# Patient Record
Sex: Female | Born: 1945 | Race: White | Hispanic: No | Marital: Married | Smoking: Former smoker
Health system: Southern US, Community
[De-identification: ages and names within clinical notes are randomized; demographics above are authoritative.]

## PROBLEM LIST (undated history)

## (undated) DIAGNOSIS — N200 Calculus of kidney: Secondary | ICD-10-CM

## (undated) DIAGNOSIS — N390 Urinary tract infection, site not specified: Secondary | ICD-10-CM

## (undated) DIAGNOSIS — D126 Benign neoplasm of colon, unspecified: Secondary | ICD-10-CM

## (undated) DIAGNOSIS — I959 Hypotension, unspecified: Secondary | ICD-10-CM

## (undated) DIAGNOSIS — F419 Anxiety disorder, unspecified: Secondary | ICD-10-CM

## (undated) DIAGNOSIS — I499 Cardiac arrhythmia, unspecified: Secondary | ICD-10-CM

## (undated) DIAGNOSIS — K219 Gastro-esophageal reflux disease without esophagitis: Secondary | ICD-10-CM

## (undated) DIAGNOSIS — M1990 Unspecified osteoarthritis, unspecified site: Secondary | ICD-10-CM

## (undated) HISTORY — PX: POLYPECTOMY: SHX149

## (undated) HISTORY — DX: Gastro-esophageal reflux disease without esophagitis: K21.9

## (undated) HISTORY — PX: TONSILLECTOMY: SUR1361

## (undated) HISTORY — DX: Unspecified osteoarthritis, unspecified site: M19.90

## (undated) HISTORY — PX: BACK SURGERY: SHX140

## (undated) HISTORY — PX: OTHER SURGICAL HISTORY: SHX169

---

## 1982-04-29 HISTORY — PX: ABDOMINAL HYSTERECTOMY: SHX81

## 1988-04-29 HISTORY — PX: KNEE SURGERY: SHX244

## 1992-04-29 HISTORY — PX: HAND SURGERY: SHX662

## 1998-04-29 HISTORY — PX: EYE SURGERY: SHX253

## 2000-04-29 HISTORY — PX: BUNIONECTOMY: SHX129

## 2004-09-17 ENCOUNTER — Ambulatory Visit (HOSPITAL_COMMUNITY): Admission: RE | Admit: 2004-09-17 | Discharge: 2004-09-17 | Payer: Self-pay | Admitting: Gastroenterology

## 2005-04-29 HISTORY — PX: SHOULDER SURGERY: SHX246

## 2010-12-20 ENCOUNTER — Ambulatory Visit
Admission: RE | Admit: 2010-12-20 | Discharge: 2010-12-20 | Disposition: A | Discharge status: Home / Self Care | Payer: Medicare Other | Source: Ambulatory Visit | Attending: Family Medicine | Admitting: Family Medicine

## 2010-12-20 ENCOUNTER — Other Ambulatory Visit: Payer: Self-pay | Admitting: Family Medicine

## 2010-12-20 DIAGNOSIS — J209 Acute bronchitis, unspecified: Secondary | ICD-10-CM

## 2010-12-20 DIAGNOSIS — R05 Cough: Secondary | ICD-10-CM

## 2010-12-20 DIAGNOSIS — R059 Cough, unspecified: Secondary | ICD-10-CM

## 2011-07-23 ENCOUNTER — Encounter (INDEPENDENT_AMBULATORY_CARE_PROVIDER_SITE_OTHER): Payer: Self-pay | Admitting: General Surgery

## 2011-07-23 ENCOUNTER — Ambulatory Visit (INDEPENDENT_AMBULATORY_CARE_PROVIDER_SITE_OTHER): Payer: Medicare Other | Admitting: General Surgery

## 2011-07-23 VITALS — BP 148/85 | HR 80 | Temp 97.8°F | Ht 61.0 in | Wt 175.2 lb

## 2011-07-23 DIAGNOSIS — D126 Benign neoplasm of colon, unspecified: Secondary | ICD-10-CM | POA: Insufficient documentation

## 2011-07-23 NOTE — Patient Instructions (Addendum)
Take the bowel preparation the day before surgery. We stopped the medications we discussed one week before the surgery.  [facility]  ONE-DAY (1) PRE-OP HOME COLON PREP INSTRUCTIONS: ** MIRALAX / GATORADE PREP **  Fill the two prescriptions at a pharmacy of your choice.  You must follow the instructions below carefully.  If you have questions or problems, please call and speak to someone in the clinic department at our office:   [phone].  MIRALAX - GATORADE -- DULCOLAX TABS:   Fill the prescriptions for MIRALAX  (255 gm bottle)    In addition, purchase four (4) DULCOLAX TABLETS (no prescription required), and one 64 oz GATORADE.  (Do NOT purchase red Gatorade; any other flavor is acceptable).  ANITIBIOTICS:   There will be 2 different antibiotics.     Take both prescriptions THE AFTERNOON BEFORE your surgery, at the times written on the bottles.  INSTRUCTIONS: 1. Five days prior to your procedure do not eat nuts, popcorn, or fruit with seeds.  Stop all fiber supplements such as Metamucil, Citrucel, etc.  2. The day before your procedure: o 6:00am:  take (4) Dulcolax tablets.  You should remain on clear liquids for the entire day.   CLEAR LIQUIDS: clear bouillon, broth, jello (NOT RED), black coffee, tea, soda, etc o 10:00am:  add the bottle of MiraLax to the 64-oz bottle of Gatorade, and dissolve.  Begin drinking the Gatorade mixture until gone (8 oz every 15-30 minutes).  Continue clear liquids until midnight (or bedtime). o Take the antibiotics at the times instructed on the bottles.  3. The day of your procedure:   Do not eat or drink ANYTHING after midnight before your surgery.     If you take Heart or Blood Pressure medicine, ask the pre-op nurses about these during your preop appointment.   Further pre-operative instructions will be given to you from the hospital.   Expect to be contacted 5-7 days before your surgery.

## 2011-07-23 NOTE — Progress Notes (Addendum)
Patient ID: Katherine Floyd, female   DOB: 1946/02/26, 66 y.o.   MRN: [id number]  Chief Complaint  Patient presents with  . Pre-op Exam    eval Rt colon polyp lesion    Katherine Floyd is a 66 y.o. female.   HPIShe is referred by Dr. 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 In 2011, she had colonoscopy and removal of polyp in this area. Recently she had repeat colonoscopy and the polyp recurred and cannot be completely removed. 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 There is a family history of colon cancer in her maternal uncle. Her mother had breast cancer. She's been sent over here to discuss partial colectomy for her recurrent polyp.  Past Medical History  Diagnosis Date  . Arthritis   . Asthma   . GERD (gastroesophageal reflux disease)   . Osteoporosis     Past Surgical History  Procedure Date  . Eye surgery 2000  . Tonsillectomy age of 13  . Hand surgery 1994    cyst removed  . Bunionectomy 2002    right foot  . Knee surgery 1990  . Shoulder surgery 2007    right   . Abdominal hysterectomy 1984  . Back surgery 1997-1998    fell on ice and snow    Family History  Problem Relation Age of Onset  . Cancer Mother     breast  . Cancer Maternal Uncle     colon  . Cancer Cousin     breast    Social History History  Substance Use Topics  . Smoking status: Former Games developer  . Smokeless tobacco: Former Neurosurgeon    Quit date: 07/23/1998  . Alcohol Use: No    Allergies  Allergen Reactions  . Sulfur Itching  . Ampicillin Itching and Rash    Current Outpatient Prescriptions  Medication Sig Dispense Refill  . ADVAIR DISKUS 100-50 MCG/DOSE AEPB       . alendronate (FOSAMAX) 70 MG tablet       . ALPRAZolam (XANAX) 0.5 MG tablet       . calcium carbonate (OS-CAL) 600 MG TABS Take 600 mg by mouth daily.      .  cetirizine (ZYRTEC) 10 MG tablet Take 10 mg by mouth daily.      Marland Kitchen docusate sodium (COLACE) 100 MG capsule Take 100 mg by mouth Nightly.      . Grape Seed 100 MG CAPS Take 100 mg by mouth daily.      . hydrochlorothiazide (HYDRODIURIL) 25 MG tablet Take 25 mg by mouth daily.      . montelukast (SINGULAIR) 10 MG tablet       . omeprazole (PRILOSEC) 40 MG capsule Take 40 mg by mouth daily.      Marland Kitchen PROAIR HFA 108 (90 BASE) MCG/ACT inhaler       . Red Yeast Rice 600 MG CAPS Take 2 capsules by mouth daily.      . vitamin E 400 UNIT capsule Take 400 Units by mouth daily.      Rolene Arbour BOWEL PREP SOLN         Review of Systems Review of Systems  Constitutional: Negative.   HENT: Positive for congestion.   Respiratory: Positive for wheezing.   Cardiovascular: Negative.   Gastrointestinal: Positive for constipation.  Genitourinary: Negative.   Musculoskeletal: Positive for  arthralgias.  Neurological: Positive for headaches.  Hematological: Negative.     Blood pressure 148/85, pulse 80, temperature 97.8 F (36.6 C), temperature source Temporal, height 5\' 1"  (1.549 m), weight 175 lb 3.2 oz (79.47 kg), SpO2 97.00%.  Physical Exam Physical Exam  Constitutional:       Overweight female in no acute distress.  HENT:  Head: Normocephalic and atraumatic.  Eyes: EOM are normal. No scleral icterus.  Neck: Neck supple. No JVD present.  Cardiovascular: Normal rate and regular rhythm.   Pulmonary/Chest: Effort normal and breath sounds normal.  Abdominal: Soft. She exhibits no distension and no mass. There is no tenderness.       Lower transverse scar.  No hernias.  Musculoskeletal: She exhibits no edema.  Neurological: She is alert. She exhibits normal muscle tone.  Skin: Skin is warm and dry.    Data Reviewed Dr. Kenna Gilbert notes, colonoscopy reports, pathology reports.  Assessment    Recurrent adenomatous polyp of right colon that could not be removed colonoscopically.    Plan     Laparoscopic-assisted partial colectomy. One day bowel prep.  I have explained the procedure and risks of colon resection.  Risks include but are not limited to bleeding, infection, wound problems, anesthesia, anastomotic leak, need for colostomy, injury to intraabominal organs (such as intestine, spleen, kidney, bladder, ureter, etc.), ileus, irregular bowel habits.  She seems to understand and agrees to proceed.       Kerissa Coia J 07/23/2011, 6:01 PM

## 2011-08-27 ENCOUNTER — Encounter (HOSPITAL_COMMUNITY): Payer: Self-pay | Admitting: Pharmacy Technician

## 2011-08-29 ENCOUNTER — Telehealth (INDEPENDENT_AMBULATORY_CARE_PROVIDER_SITE_OTHER): Payer: Self-pay | Admitting: General Surgery

## 2011-08-29 DIAGNOSIS — N390 Urinary tract infection, site not specified: Secondary | ICD-10-CM

## 2011-08-29 HISTORY — DX: Urinary tract infection, site not specified: N39.0

## 2011-08-29 NOTE — Telephone Encounter (Signed)
Pt calling to report she has symptoms of urinary tract infection: frequency, urgency, painful urination.  She is scheduled for surgery on 09/06/11.  Pt states she has some leftover antibiotics at home and asking if OK to take them.  Advised pt NOT to take those meds, and to call her PCP for appt.  They will check her urine and treat her with the appropriate medication then.  Admonished her to never take medicine that is left over from treating something else.  Pt states she will call her PCP immediately.

## 2011-09-02 ENCOUNTER — Encounter (HOSPITAL_COMMUNITY)
Admission: RE | Admit: 2011-09-02 | Discharge: 2011-09-02 | Disposition: A | Discharge status: Home / Self Care | Payer: Medicare Other | Source: Ambulatory Visit | Attending: General Surgery | Admitting: General Surgery

## 2011-09-02 ENCOUNTER — Telehealth (INDEPENDENT_AMBULATORY_CARE_PROVIDER_SITE_OTHER): Payer: Self-pay | Admitting: General Surgery

## 2011-09-02 ENCOUNTER — Ambulatory Visit (HOSPITAL_COMMUNITY)
Admission: RE | Admit: 2011-09-02 | Discharge: 2011-09-02 | Disposition: A | Discharge status: Home / Self Care | Payer: Medicare Other | Source: Ambulatory Visit | Attending: General Surgery | Admitting: General Surgery

## 2011-09-02 ENCOUNTER — Encounter (HOSPITAL_COMMUNITY): Payer: Self-pay

## 2011-09-02 DIAGNOSIS — D369 Benign neoplasm, unspecified site: Secondary | ICD-10-CM | POA: Insufficient documentation

## 2011-09-02 DIAGNOSIS — Z01812 Encounter for preprocedural laboratory examination: Secondary | ICD-10-CM | POA: Insufficient documentation

## 2011-09-02 DIAGNOSIS — J45909 Unspecified asthma, uncomplicated: Secondary | ICD-10-CM | POA: Insufficient documentation

## 2011-09-02 DIAGNOSIS — Z01810 Encounter for preprocedural cardiovascular examination: Secondary | ICD-10-CM | POA: Insufficient documentation

## 2011-09-02 HISTORY — DX: Calculus of kidney: N20.0

## 2011-09-02 HISTORY — DX: Anxiety disorder, unspecified: F41.9

## 2011-09-02 HISTORY — DX: Benign neoplasm of colon, unspecified: D12.6

## 2011-09-02 HISTORY — DX: Urinary tract infection, site not specified: N39.0

## 2011-09-02 LAB — CBC
HCT: 40.4 % (ref 36.0–46.0)
Platelets: 286 10*3/uL (ref 150–400)
RDW: 14.5 % (ref 11.5–15.5)
WBC: 8.4 10*3/uL (ref 4.0–10.5)

## 2011-09-02 LAB — COMPREHENSIVE METABOLIC PANEL
AST: 29 U/L (ref 0–37)
Albumin: 4.1 g/dL (ref 3.5–5.2)
Alkaline Phosphatase: 94 U/L (ref 39–117)
BUN: 10 mg/dL (ref 6–23)
Chloride: 93 mEq/L — ABNORMAL LOW (ref 96–112)
Potassium: 3.3 mEq/L — ABNORMAL LOW (ref 3.5–5.1)
Total Bilirubin: 0.3 mg/dL (ref 0.3–1.2)

## 2011-09-02 NOTE — Patient Instructions (Addendum)
[address] SAKSHI SERMONS  09/02/2011   Your procedure is scheduled on:  09/06/11 Friday   Surgery 0730-1000  Report to [facility] at 0515      AM.  Call this number if you have problems the morning of surgery: [phone]     Or PST   [id number]  Ashley Akin INHALERS WITH YOU TO HOSPITAL  Remember:               NO ASPIRIN PRODUCTS OR ANTIINFLAMMATORIES 7 DAYS BEFORE SURGERY  Do not eat food:After Midnight. Wednesday NIGHT AS PER OFFICE  May have clear liquids: all day Thursday UNTIL BEDTIME OR MIDNIGHT       INCREASE FLUID INTAKE Thursday      BOWEL PREP AS PER OFFICE  Clear liquids include soda, tea, black coffee, apple or grape juice, broth.  Take these medicines the morning of surgery with A SIP OF WATER: Advair ,   APRAZOLAM,ZYRTEC and PROLISEC                 MAY USE PRO AIR IF NEEDED   Do not wear jewelry, make-up or nail polish.  Do not wear lotions, powders, or perfumes. You may wear deodorant.  Do not shave 48 hours prior to surgery.  Do not bring valuables to the hospital.  Contacts, dentures or bridgework may not be worn into surgery.  Leave suitcase in the car. After surgery it may be brought to your room.  For patients admitted to the hospital, checkout time is 11:00 AM the day of discharge.   Patients discharged the day of surgery will not be allowed to drive home.  Name and phone number of your driver:   husband                                                                   Special Instructions: CHG Shower Use Special Wash: 1/2 bottle night before surgery and 1/2 bottle morning of surgery. REGULAR SOAP FACE AND PRIVATES              LADIES- NO SHAVING 48 HOURS BEFORE USING BETASEPT SOAP.           Please read over the following fact sheets that you were given: MRSA Information

## 2011-09-02 NOTE — Telephone Encounter (Signed)
Pt calling with update:  She called last week to report symptoms of UTI and was advised to see her PCP, which she did.  Since last Thursday (08/29/11) she has been on Cipro.  She is asking if her surgery on Friday (09/06/11) can go on as scheduled?  Please call her to confirm.  Home:  [phone] or Cell:  [phone].  Thanks.

## 2011-09-02 NOTE — Pre-Procedure Instructions (Signed)
Instructed to notify surgeon of UTI last week and is on Cipro

## 2011-09-06 ENCOUNTER — Encounter (HOSPITAL_COMMUNITY): Payer: Self-pay | Admitting: *Deleted

## 2011-09-06 ENCOUNTER — Encounter (HOSPITAL_COMMUNITY): Payer: Self-pay | Admitting: Anesthesiology

## 2011-09-06 ENCOUNTER — Ambulatory Visit (HOSPITAL_COMMUNITY): Payer: Medicare Other | Admitting: Anesthesiology

## 2011-09-06 ENCOUNTER — Encounter (HOSPITAL_COMMUNITY)
Admission: RE | Disposition: A | Discharge status: Home / Self Care | Payer: Self-pay | Source: Ambulatory Visit | Attending: General Surgery

## 2011-09-06 ENCOUNTER — Inpatient Hospital Stay (HOSPITAL_COMMUNITY)
Admission: RE | Admit: 2011-09-06 | Discharge: 2011-09-12 | DRG: 330 | Disposition: A | Discharge status: Home / Self Care | Payer: Medicare Other | Source: Ambulatory Visit | Attending: General Surgery | Admitting: General Surgery

## 2011-09-06 DIAGNOSIS — K219 Gastro-esophageal reflux disease without esophagitis: Secondary | ICD-10-CM | POA: Diagnosis present

## 2011-09-06 DIAGNOSIS — F411 Generalized anxiety disorder: Secondary | ICD-10-CM | POA: Diagnosis present

## 2011-09-06 DIAGNOSIS — M810 Age-related osteoporosis without current pathological fracture: Secondary | ICD-10-CM | POA: Diagnosis present

## 2011-09-06 DIAGNOSIS — J45909 Unspecified asthma, uncomplicated: Secondary | ICD-10-CM | POA: Diagnosis present

## 2011-09-06 DIAGNOSIS — K560 Paralytic ileus: Secondary | ICD-10-CM | POA: Diagnosis not present

## 2011-09-06 DIAGNOSIS — Z87442 Personal history of urinary calculi: Secondary | ICD-10-CM

## 2011-09-06 DIAGNOSIS — D126 Benign neoplasm of colon, unspecified: Secondary | ICD-10-CM | POA: Diagnosis present

## 2011-09-06 HISTORY — PX: HEMICOLECTOMY: SHX854

## 2011-09-06 LAB — TYPE AND SCREEN
ABO/RH(D): AB NEG
Antibody Screen: NEGATIVE

## 2011-09-06 LAB — ABO/RH: ABO/RH(D): AB NEG

## 2011-09-06 SURGERY — LAPAROSCOPIC RIGHT HEMI COLECTOMY
Anesthesia: General | Site: Abdomen | Laterality: Right | Wound class: Contaminated

## 2011-09-06 MED ORDER — ALBUTEROL SULFATE HFA 108 (90 BASE) MCG/ACT IN AERS
2.0000 | INHALATION_SPRAY | RESPIRATORY_TRACT | Status: DC | PRN
  Filled 2011-09-06: qty 6.7

## 2011-09-06 MED ORDER — ACETAMINOPHEN 10 MG/ML IV SOLN
INTRAVENOUS | Status: AC
  Filled 2011-09-06: qty 100

## 2011-09-06 MED ORDER — LACTATED RINGERS IR SOLN
Status: DC | PRN
  Administered 2011-09-06: 3000 mL

## 2011-09-06 MED ORDER — FLUTICASONE-SALMETEROL 100-50 MCG/DOSE IN AEPB
1.0000 | INHALATION_SPRAY | Freq: Two times a day (BID) | RESPIRATORY_TRACT | Status: DC
  Administered 2011-09-07 – 2011-09-12 (×9): 1 via RESPIRATORY_TRACT
  Filled 2011-09-06: qty 14

## 2011-09-06 MED ORDER — MORPHINE SULFATE (PF) 1 MG/ML IV SOLN
INTRAVENOUS | Status: AC
  Filled 2011-09-06: qty 25

## 2011-09-06 MED ORDER — BUPIVACAINE-EPINEPHRINE 0.25% -1:200000 IJ SOLN
INTRAMUSCULAR | Status: AC
  Filled 2011-09-06: qty 1

## 2011-09-06 MED ORDER — FENTANYL CITRATE 0.05 MG/ML IJ SOLN
INTRAMUSCULAR | Status: DC | PRN
  Administered 2011-09-06: 50 ug via INTRAVENOUS
  Administered 2011-09-06: 100 ug via INTRAVENOUS
  Administered 2011-09-06: 50 ug via INTRAVENOUS

## 2011-09-06 MED ORDER — POLYVINYL ALCOHOL 1.4 % OP SOLN
1.0000 [drp] | Freq: Three times a day (TID) | OPHTHALMIC | Status: DC | PRN
  Administered 2011-09-06: 1 [drp] via OPHTHALMIC
  Filled 2011-09-06: qty 15

## 2011-09-06 MED ORDER — HYDROMORPHONE HCL PF 1 MG/ML IJ SOLN
INTRAMUSCULAR | Status: AC
  Filled 2011-09-06: qty 1

## 2011-09-06 MED ORDER — CARBOXYMETHYLCELLULOSE SODIUM 0.5 % OP SOLN: 1.0000 [drp] | Freq: Three times a day (TID) | OPHTHALMIC | Status: DC | PRN

## 2011-09-06 MED ORDER — NALOXONE HCL 0.4 MG/ML IJ SOLN: 0.4000 mg | INTRAMUSCULAR | Status: DC | PRN

## 2011-09-06 MED ORDER — ONDANSETRON HCL 4 MG/2ML IJ SOLN
INTRAMUSCULAR | Status: DC | PRN
  Administered 2011-09-06: 4 mg via INTRAVENOUS

## 2011-09-06 MED ORDER — DIPHENHYDRAMINE HCL 12.5 MG/5ML PO ELIX: 12.5000 mg | ORAL_SOLUTION | Freq: Four times a day (QID) | ORAL | Status: DC | PRN

## 2011-09-06 MED ORDER — DEXAMETHASONE SODIUM PHOSPHATE 10 MG/ML IJ SOLN
INTRAMUSCULAR | Status: DC | PRN
  Administered 2011-09-06: 4 mg via INTRAVENOUS

## 2011-09-06 MED ORDER — ONDANSETRON HCL 4 MG/2ML IJ SOLN
4.0000 mg | Freq: Four times a day (QID) | INTRAMUSCULAR | Status: DC | PRN
  Administered 2011-09-09: 4 mg via INTRAVENOUS
  Filled 2011-09-06: qty 2

## 2011-09-06 MED ORDER — LACTATED RINGERS IV SOLN
INTRAVENOUS | Status: DC | PRN
  Administered 2011-09-06 (×3): via INTRAVENOUS

## 2011-09-06 MED ORDER — LIDOCAINE HCL (CARDIAC) 20 MG/ML IV SOLN
INTRAVENOUS | Status: DC | PRN
  Administered 2011-09-06: 50 mg via INTRAVENOUS

## 2011-09-06 MED ORDER — PROPOFOL 10 MG/ML IV BOLUS
INTRAVENOUS | Status: DC | PRN
  Administered 2011-09-06: 175 mg via INTRAVENOUS

## 2011-09-06 MED ORDER — BUPIVACAINE-EPINEPHRINE 0.25% -1:200000 IJ SOLN
INTRAMUSCULAR | Status: DC | PRN
  Administered 2011-09-06: 10 mL

## 2011-09-06 MED ORDER — ONDANSETRON HCL 4 MG/2ML IJ SOLN
4.0000 mg | Freq: Four times a day (QID) | INTRAMUSCULAR | Status: DC | PRN
  Administered 2011-09-08 (×2): 4 mg via INTRAVENOUS
  Filled 2011-09-06 (×2): qty 2

## 2011-09-06 MED ORDER — SODIUM CHLORIDE 0.9 % IV SOLN
INTRAVENOUS | Status: AC
  Filled 2011-09-06: qty 1

## 2011-09-06 MED ORDER — DIPHENHYDRAMINE HCL 50 MG/ML IJ SOLN: 12.5000 mg | Freq: Four times a day (QID) | INTRAMUSCULAR | Status: DC | PRN

## 2011-09-06 MED ORDER — ACETAMINOPHEN 10 MG/ML IV SOLN
INTRAVENOUS | Status: DC | PRN
  Administered 2011-09-06: 1000 mg via INTRAVENOUS

## 2011-09-06 MED ORDER — HYDROMORPHONE HCL PF 1 MG/ML IJ SOLN
INTRAMUSCULAR | Status: DC | PRN
  Administered 2011-09-06 (×4): 0.5 mg via INTRAVENOUS

## 2011-09-06 MED ORDER — ALVIMOPAN 12 MG PO CAPS
12.0000 mg | ORAL_CAPSULE | Freq: Two times a day (BID) | ORAL | Status: DC
  Administered 2011-09-07 – 2011-09-09 (×6): 12 mg via ORAL
  Filled 2011-09-06 (×8): qty 1

## 2011-09-06 MED ORDER — HEPARIN SODIUM (PORCINE) 5000 UNIT/ML IJ SOLN
5000.0000 [IU] | Freq: Three times a day (TID) | INTRAMUSCULAR | Status: DC
  Administered 2011-09-07 – 2011-09-12 (×16): 5000 [IU] via SUBCUTANEOUS
  Filled 2011-09-06 (×20): qty 1

## 2011-09-06 MED ORDER — ALVIMOPAN 12 MG PO CAPS
12.0000 mg | ORAL_CAPSULE | Freq: Once | ORAL | Status: AC
  Administered 2011-09-06: 12 mg via ORAL

## 2011-09-06 MED ORDER — SODIUM CHLORIDE 0.9 % IR SOLN
Status: DC | PRN
  Administered 2011-09-06: 1000 mL

## 2011-09-06 MED ORDER — SODIUM CHLORIDE 0.9 % IV SOLN
1.0000 g | INTRAVENOUS | Status: AC
  Administered 2011-09-06: 1 g via INTRAVENOUS

## 2011-09-06 MED ORDER — METOCLOPRAMIDE HCL 5 MG/ML IJ SOLN
INTRAMUSCULAR | Status: DC | PRN
  Administered 2011-09-06: 10 mg via INTRAVENOUS

## 2011-09-06 MED ORDER — ONDANSETRON HCL 4 MG PO TABS: 4.0000 mg | ORAL_TABLET | Freq: Four times a day (QID) | ORAL | Status: DC | PRN

## 2011-09-06 MED ORDER — NEOSTIGMINE METHYLSULFATE 1 MG/ML IJ SOLN
INTRAMUSCULAR | Status: DC | PRN
  Administered 2011-09-06: 5 mg via INTRAVENOUS

## 2011-09-06 MED ORDER — ALVIMOPAN 12 MG PO CAPS
ORAL_CAPSULE | ORAL | Status: AC
  Filled 2011-09-06: qty 1

## 2011-09-06 MED ORDER — MONTELUKAST SODIUM 10 MG PO TABS
10.0000 mg | ORAL_TABLET | Freq: Every day | ORAL | Status: DC
  Administered 2011-09-06 – 2011-09-11 (×5): 10 mg via ORAL
  Filled 2011-09-06 (×7): qty 1

## 2011-09-06 MED ORDER — KCL IN DEXTROSE-NACL 20-5-0.9 MEQ/L-%-% IV SOLN
INTRAVENOUS | Status: DC
  Administered 2011-09-06 (×2): via INTRAVENOUS
  Administered 2011-09-07: 75 mL/h via INTRAVENOUS
  Administered 2011-09-08: 06:00:00 via INTRAVENOUS
  Administered 2011-09-08: 75 mL/h via INTRAVENOUS
  Administered 2011-09-09 – 2011-09-10 (×4): via INTRAVENOUS
  Filled 2011-09-06 (×12): qty 1000

## 2011-09-06 MED ORDER — MIDAZOLAM HCL 5 MG/5ML IJ SOLN
INTRAMUSCULAR | Status: DC | PRN
  Administered 2011-09-06 (×2): 1 mg via INTRAVENOUS

## 2011-09-06 MED ORDER — ALPRAZOLAM 0.25 MG PO TABS: 0.2500 mg | ORAL_TABLET | Freq: Three times a day (TID) | ORAL | Status: DC | PRN

## 2011-09-06 MED ORDER — HYDROMORPHONE HCL PF 1 MG/ML IJ SOLN
0.2500 mg | INTRAMUSCULAR | Status: DC | PRN
  Administered 2011-09-06 (×2): 0.5 mg via INTRAVENOUS

## 2011-09-06 MED ORDER — ROCURONIUM BROMIDE 100 MG/10ML IV SOLN
INTRAVENOUS | Status: DC | PRN
  Administered 2011-09-06: 50 mg via INTRAVENOUS
  Administered 2011-09-06: 10 mg via INTRAVENOUS

## 2011-09-06 MED ORDER — MORPHINE SULFATE (PF) 1 MG/ML IV SOLN
INTRAVENOUS | Status: DC
  Administered 2011-09-06 (×2): 1.5 mg via INTRAVENOUS
  Administered 2011-09-06: 1 mg via INTRAVENOUS
  Administered 2011-09-06 – 2011-09-07 (×4): 1.5 mg via INTRAVENOUS
  Administered 2011-09-08: 4.5 mg via INTRAVENOUS
  Administered 2011-09-08: 18:00:00 via INTRAVENOUS
  Administered 2011-09-09: 1.5 mg via INTRAVENOUS
  Filled 2011-09-06: qty 25

## 2011-09-06 MED ORDER — EPHEDRINE SULFATE 50 MG/ML IJ SOLN
INTRAMUSCULAR | Status: DC | PRN
  Administered 2011-09-06 (×2): 5 mg via INTRAVENOUS

## 2011-09-06 MED ORDER — SODIUM CHLORIDE 0.9 % IJ SOLN: 9.0000 mL | INTRAMUSCULAR | Status: DC | PRN

## 2011-09-06 MED ORDER — GLYCOPYRROLATE 0.2 MG/ML IJ SOLN
INTRAMUSCULAR | Status: DC | PRN
  Administered 2011-09-06: .8 mg via INTRAVENOUS

## 2011-09-06 SURGICAL SUPPLY — 68 items
APPLIER CLIP ROT 10 11.4 M/L (STAPLE)
APR CLP MED LRG 11.4X10 (STAPLE)
BLADE SURG 10 STRL SS (BLADE) ×2 IMPLANT
BLADE SURG ROTATE 9660 (MISCELLANEOUS) IMPLANT
CANISTER SUCTION 2500CC (MISCELLANEOUS) ×2 IMPLANT
CELLS DAT CNTRL 66122 CELL SVR (MISCELLANEOUS) IMPLANT
CHLORAPREP W/TINT 26ML (MISCELLANEOUS) ×2 IMPLANT
CLIP APPLIE ROT 10 11.4 M/L (STAPLE) IMPLANT
CLOTH BEACON ORANGE TIMEOUT ST (SAFETY) ×2 IMPLANT
COVER SURGICAL LIGHT HANDLE (MISCELLANEOUS) ×2 IMPLANT
DECANTER SPIKE VIAL GLASS SM (MISCELLANEOUS) ×2 IMPLANT
DISSECTOR BLUNT TIP ENDO 5MM (MISCELLANEOUS) IMPLANT
DRAPE PROXIMA HALF (DRAPES) IMPLANT
DRAPE UTILITY 15X26 W/TAPE STR (DRAPE) ×5 IMPLANT
DRAPE WARM FLUID 44X44 (DRAPE) ×2 IMPLANT
ELECT CAUTERY BLADE 6.4 (BLADE) ×2 IMPLANT
ELECT REM PT RETURN 9FT ADLT (ELECTROSURGICAL) ×2
ELECTRODE REM PT RTRN 9FT ADLT (ELECTROSURGICAL) ×1 IMPLANT
GEL ULTRASOUND 20GR AQUASONIC (MISCELLANEOUS) IMPLANT
GLOVE BIOGEL PI IND STRL 8 (GLOVE) ×1 IMPLANT
GLOVE BIOGEL PI INDICATOR 8 (GLOVE) ×1
GLOVE ECLIPSE 8.0 STRL XLNG CF (GLOVE) ×4 IMPLANT
GOWN STRL NON-REIN LRG LVL3 (GOWN DISPOSABLE) ×8 IMPLANT
KIT BASIN OR (CUSTOM PROCEDURE TRAY) ×2 IMPLANT
KIT ROOM TURNOVER OR (KITS) ×2 IMPLANT
LEGGING LITHOTOMY PAIR STRL (DRAPES) IMPLANT
LIGASURE 5MM LAPAROSCOPIC (INSTRUMENTS) IMPLANT
LIGASURE IMPACT 36 18CM CVD LR (INSTRUMENTS) ×1 IMPLANT
NS IRRIG 1000ML POUR BTL (IV SOLUTION) ×2 IMPLANT
PAD ARMBOARD 7.5X6 YLW CONV (MISCELLANEOUS) IMPLANT
PENCIL BUTTON HOLSTER BLD 10FT (ELECTRODE) ×2 IMPLANT
RELOAD PROXIMATE 75MM BLUE (ENDOMECHANICALS) ×4 IMPLANT
RTRCTR WOUND ALEXIS 18CM MED (MISCELLANEOUS)
SCALPEL HARMONIC ACE (MISCELLANEOUS) IMPLANT
SCISSORS LAP 5X35 DISP (ENDOMECHANICALS) ×1 IMPLANT
SET IRRIG TUBING LAPAROSCOPIC (IRRIGATION / IRRIGATOR) IMPLANT
SLEEVE ENDOPATH XCEL 5M (ENDOMECHANICALS) IMPLANT
SPECIMEN JAR LARGE (MISCELLANEOUS) ×2 IMPLANT
SPONGE GAUZE 4X4 12PLY (GAUZE/BANDAGES/DRESSINGS) ×2 IMPLANT
STAPLER 90 3.5 STAND SLIM (STAPLE) ×2
STAPLER 90 3.5 STD SLIM (STAPLE) IMPLANT
STAPLER PROXIMATE 75MM BLUE (STAPLE) ×2 IMPLANT
STAPLER VISISTAT 35W (STAPLE) ×2 IMPLANT
SURGILUBE 2OZ TUBE FLIPTOP (MISCELLANEOUS) IMPLANT
SUT PDS AB 1 CT  36 (SUTURE)
SUT PDS AB 1 CT 36 (SUTURE) IMPLANT
SUT PROLENE 2 0 CT2 30 (SUTURE) IMPLANT
SUT PROLENE 2 0 KS (SUTURE) IMPLANT
SUT SILK 2 0 (SUTURE) ×2
SUT SILK 2 0 SH CR/8 (SUTURE) ×2 IMPLANT
SUT SILK 2-0 18XBRD TIE 12 (SUTURE) ×1 IMPLANT
SUT SILK 3 0 (SUTURE) ×2
SUT SILK 3 0 SH CR/8 (SUTURE) ×2 IMPLANT
SUT SILK 3-0 18XBRD TIE 12 (SUTURE) ×1 IMPLANT
SYS LAPSCP GELPORT 120MM (MISCELLANEOUS) ×2
SYSTEM LAPSCP GELPORT 120MM (MISCELLANEOUS) IMPLANT
TOWEL OR 17X24 6PK STRL BLUE (TOWEL DISPOSABLE) ×2 IMPLANT
TOWEL OR 17X26 10 PK STRL BLUE (TOWEL DISPOSABLE) ×2 IMPLANT
TRAY FOLEY CATH 14FRSI W/METER (CATHETERS) ×1 IMPLANT
TRAY LAPAROSCOPIC (CUSTOM PROCEDURE TRAY) ×2 IMPLANT
TRAY PROCTOSCOPIC FIBER OPTIC (SET/KITS/TRAYS/PACK) IMPLANT
TROCAR BLADELESS OPT 5 75 (ENDOMECHANICALS) ×4 IMPLANT
TROCAR XCEL NON-BLD 11X100MML (ENDOMECHANICALS) ×1 IMPLANT
TROCAR XCEL NON-BLD 5MMX100MML (ENDOMECHANICALS) IMPLANT
TUBE CONNECTING 12X1/4 (SUCTIONS) ×1 IMPLANT
TUBING FILTER THERMOFLATOR (ELECTROSURGICAL) ×2 IMPLANT
WATER STERILE IRR 1000ML POUR (IV SOLUTION) ×2 IMPLANT
YANKAUER SUCT BULB TIP NO VENT (SUCTIONS) ×2 IMPLANT

## 2011-09-06 NOTE — Transfer of Care (Signed)
Immediate Anesthesia Transfer of Care Note  Patient: Katherine Floyd  Procedure(s) Performed: Procedure(s) (LRB): LAPAROSCOPIC RIGHT HEMI COLECTOMY (Right)  Patient Location: PACU  Anesthesia Type: General  Level of Consciousness: oriented, sedated and patient cooperative  Airway & Oxygen Therapy: Patient Spontanous Breathing and Patient connected to face mask oxygen  Post-op Assessment: Report given to PACU RN, Post -op Vital signs reviewed and stable and Patient moving all extremities  Post vital signs: Reviewed and stable  Complications: No apparent anesthesia complications

## 2011-09-06 NOTE — Interval H&P Note (Signed)
History and Physical Interval Note:  09/06/2011 7:18 AM  Katherine Floyd  has presented today for surgery, with the diagnosis of recurrent adenomatous polyp of right colon  The various methods of treatment have been discussed with the patient and family. After consideration of risks, benefits and other options for treatment, the patient has consented to  Procedure(s) (LRB): LAPAROSCOPIC RIGHT HEMI COLECTOMY (Right) as a surgical intervention .  The patients' history has been reviewed, patient examined, no change in status, stable for surgery.  I have reviewed the patients' chart and labs.  Questions were answered to the patient's satisfaction.     Santos Sollenberger Shela Commons

## 2011-09-06 NOTE — Anesthesia Preprocedure Evaluation (Signed)
Anesthesia Evaluation  Patient identified by MRN, date of birth, ID band Patient awake    Reviewed: Allergy & Precautions, H&P , NPO status , Patient's Chart, lab work & pertinent test results, reviewed documented beta blocker date and time   History of Anesthesia Complications (+) PONV  Airway Mallampati: II TM Distance: >3 FB Neck ROM: Full    Dental  (+) Dental Advisory Given and Teeth Intact   Pulmonary asthma ,  breath sounds clear to auscultation        Cardiovascular negative cardio ROS  Rhythm:Regular Rate:Normal  Denies cardiac symptoms   Neuro/Psych negative neurological ROS  negative psych ROS   GI/Hepatic Neg liver ROS, Colon polyp   Endo/Other  negative endocrine ROS  Renal/GU negative Renal ROS  negative genitourinary   Musculoskeletal negative musculoskeletal ROS (+)   Abdominal   Peds negative pediatric ROS (+)  Hematology negative hematology ROS (+)   Anesthesia Other Findings Upper front caps  Reproductive/Obstetrics negative OB ROS                           Anesthesia Physical Anesthesia Plan  ASA: II  Anesthesia Plan: General   Post-op Pain Management:    Induction: Intravenous  Airway Management Planned: Oral ETT  Additional Equipment:   Intra-op Plan:   Post-operative Plan: Extubation in OR  Informed Consent: I have reviewed the patients History and Physical, chart, labs and discussed the procedure including the risks, benefits and alternatives for the proposed anesthesia with the patient or authorized representative who has indicated his/her understanding and acceptance.   Dental advisory given  Plan Discussed with: CRNA and Surgeon  Anesthesia Plan Comments:         Anesthesia Quick Evaluation

## 2011-09-06 NOTE — Anesthesia Postprocedure Evaluation (Signed)
  Anesthesia Post-op Note  Patient: Katherine Floyd  Procedure(s) Performed: Procedure(s) (LRB): LAPAROSCOPIC RIGHT HEMI COLECTOMY (Right)  Patient Location: PACU  Anesthesia Type: General  Level of Consciousness: oriented and sedated  Airway and Oxygen Therapy: Patient Spontanous Breathing and Patient connected to nasal cannula oxygen  Post-op Pain: mild  Post-op Assessment: Post-op Vital signs reviewed, Patient's Cardiovascular Status Stable, Respiratory Function Stable and Patent Airway  Post-op Vital Signs: stable  Complications: No apparent anesthesia complications

## 2011-09-06 NOTE — Progress Notes (Signed)
Pt did bowel prep 09/05/11 as instructed and clear liq diet

## 2011-09-06 NOTE — Progress Notes (Signed)
UR complete 

## 2011-09-06 NOTE — Op Note (Signed)
Operative Note  Katherine Floyd female 66 y.o. 09/06/2011  PREOPERATIVE DX:  Recurrent right colon adenomatous polyp  POSTOPERATIVE DX:  Same  PROCEDURE:Laparoscopic assisted right colectomy with distal ileum resection        Surgeon: Adolph Pollack   Assistants: Chevis Pretty M.D.  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 It is unable to be removed by way of colonoscopy. She now presents for elective partial colectomy. The procedure and risks were discussed with her preoperatively.    Procedure Detail:  She was seen in the holding room. She was brought to the operating room placed supine on the operating table and a general anesthetic was administered. A Foley catheter was inserted. The abdominal wall was widely sterilely prepped and draped.  She was placed in slight reverse Trendelenburg position. A 5 mm incision was made in the left upper quadrant subcostal area. A 5 mm Optiview trocar and 5 mm laparoscope were used to gain access to the peritoneal cavity and create a pneumoperitoneum. Inspection of the area underneath the trocar demonstrated no evidence of organ injury or bleeding. A 5 mm trocar was placed in the left lateral abdomen, the lower midline, and the right lower quadrant.  The cecum was identified and lateral attachments divided sharply. There were adhesions between the terminal ileum and lateral abdominal wall and these were divided sharply mobilizing the terminal ileum. The proximal transverse colon and hepatic flexure were divided sharply and using electrocautery. Bleeding from small vessels was controlled with the electrocautery and with hemoclips.  At this point, the terminal ileum, right colon, and proximal transverse colon were mobile and could be pulled to the level of the umbilicus easily.  A small upper midline incision was made dividing the skin  subcutaneous tissue fascia and peritoneum. A wound protection device was inserted followed by the GelPort.  Using hand assistance, I freed up more adhesions in the transverse and right colon and then exteriorized the  terminal ileum, right colon, and proximal transverse colon.  I dissected the omentum free from the proximal transverse colon. I divided the transverse colon just proximal to the middle colic vessels with the linear cutting stapler. There is an area of of scarring on the terminal ileum and I divided the ileum just proximal to this with a linear cutting stapler. The mesentery was divided in a wedge-shaped fashion using the LigaSure. The specimen was then taken off the field. I opened the specimen up on the back table and 3  colonic polyps were noted.  The specimen was sent to pathology.  A side to side anastomosis was then performed between the distal ileum and the transverse colon using the linear cutting stapler. The staple lines were solid and hemostatic. The common defect was closed with a linear noncutting stapling. A crotch stitch of 3-0 silk was placed. The anastomosis was patent, viable, and under no tension.  The abdominal cavity was then copiously irrigated with saline solution which was evacuated. No bleeding or organ injury was noted. The fascia of the upper midline incision was closed with running double looped #1 PDS suture. A pneumoperitoneum was recreated and the laparoscope was introduced. The fascial closure was solid. A four-quadrant inspection was performed and there is no evidence of bleeding or organ injury.  The trochars were removed and the CO2 gas released. The subcutaneous tissues were irrigated. The skin of all incisions was closed with staples. Sterile  dressings were applied.  She tolerated the procedure well without any apparent complications and was taken to the recovery room in satisfactory condition.   Estimated Blood Loss:  200 mL         Drains: none          Blood Given: none          Specimens: Right colon and terminal ileum        Complications:  * No complications entered in OR log *         Disposition: PACU - hemodynamically stable.         Condition: stable

## 2011-09-06 NOTE — H&P (Signed)
Katherine Floyd is an 66 y.o. female.   Chief Complaint: Recurrent right colon adenomatous polyp HPI:   She has a recurrent right colon polyp that could not be removed by way of colonoscopy and presents for elective partial colectomy.  She just completed antibiotic treatment for a UTI and is now asx from this.  Past Medical History  Diagnosis Date  . Asthma   . GERD (gastroesophageal reflux disease)   . Osteoporosis   . Urinary tract infection 08/29/11    Cipro per PCP- states is resolving  . Anxiety   . Kidney stone   . Polyp of colon, adenomatous     recurrent  . Arthritis     with fracture right great toe 08/30/11 from "stepping wrong"    Past Surgical History  Procedure Date  . Tonsillectomy age of 70  . Hand surgery 1994    cyst removed  . Bunionectomy 2002    right foot  . Knee surgery 1990  . Shoulder surgery 2007    right   . Abdominal hysterectomy 1984  . Back surgery 1997-1998    fell on ice and snow  . Uretheral dilitation   . Eye surgery 2000    cataract extraction with IOL    Family History  Problem Relation Age of Onset  . Cancer Mother     breast  . Cancer Maternal Uncle     colon  . Cancer Cousin     breast   Social History:  reports that she quit smoking about 23 years ago. She has never used smokeless tobacco. She reports that she does not drink alcohol or use illicit drugs.  Allergies:  Allergies  Allergen Reactions  . Sulfur Itching  . Ampicillin Itching and Rash    Medications Prior to Admission  Medication Sig Dispense Refill  . ADVAIR DISKUS 100-50 MCG/DOSE AEPB Inhale 1 puff into the lungs 2 (two) times daily.       Marland Kitchen ALPRAZolam (XANAX) 0.5 MG tablet Take 0.25-0.5 mg by mouth 3 (three) times daily as needed. Anxiety       . aspirin EC 81 MG tablet Take 81 mg by mouth daily with breakfast.      . Black Cohosh 540 MG CAPS Take 540 mg by mouth daily.      . carboxymethylcellulose (REFRESH PLUS) 0.5 % SOLN Place 1 drop into both eyes 3  (three) times daily as needed. Dry eyes        . cetirizine (ZYRTEC) 10 MG tablet Take 10 mg by mouth daily.      . ciprofloxacin (CIPRO) 500 MG tablet Take 500 mg by mouth 2 (two) times daily.      Marland Kitchen docusate sodium (COLACE) 100 MG capsule Take 100 mg by mouth at bedtime.       . Grape Seed 100 MG CAPS Take 100 mg by mouth daily.      . montelukast (SINGULAIR) 10 MG tablet Take 10 mg by mouth at bedtime.       Marland Kitchen omeprazole (PRILOSEC) 40 MG capsule Take 40 mg by mouth daily.      . polyethylene glycol (MIRALAX / GLYCOLAX) packet Take 17 g by mouth at bedtime as needed. Constipation       . PROAIR HFA 108 (90 BASE) MCG/ACT inhaler Inhale 2 puffs into the lungs every 4 (four) hours as needed. Wheezing       . Red Yeast Rice 600 MG CAPS Take 1,200 mg by mouth daily.       Marland Kitchen  sodium chloride (OCEAN) 0.65 % nasal spray Place 1 spray into the nose 2 (two) times daily as needed. Allergies       . vitamin E 400 UNIT capsule Take 400 Units by mouth daily.      Marland Kitchen alendronate (FOSAMAX) 70 MG tablet Take 70 mg by mouth every 7 (seven) days. Pt takes on Monday       . Calcium Carbonate-Vitamin D (CALCIUM-CARB 600 + D PO) Take 2 tablets by mouth daily.      . hydrochlorothiazide (HYDRODIURIL) 25 MG tablet Take 12.5-25 mg by mouth daily as needed. Fluid         Results for orders placed during the hospital encounter of 09/06/11 (from the past 48 hour(s))  TYPE AND SCREEN     Status: Normal   Collection Time   09/06/11  5:40 AM      Component Value Range Comment   ABO/RH(D) AB NEG      Antibody Screen NEG      Sample Expiration 09/09/2011     ABO/RH     Status: Normal   Collection Time   09/06/11  5:40 AM      Component Value Range Comment   ABO/RH(D) AB NEG      No results found.  Review of Systems  Constitutional: Negative for fever and chills.  HENT: Negative for congestion.   Respiratory: Negative for cough.   Gastrointestinal: Negative for nausea and vomiting.  Genitourinary: Negative  for dysuria.    Blood pressure 133/76, pulse 74, temperature 97.5 F (36.4 C), resp. rate 20, SpO2 97.00%. Physical Exam  Constitutional:       Overweight female in NAD  HENT:  Head: Normocephalic and atraumatic.  Cardiovascular: Normal rate and regular rhythm.   Respiratory: Effort normal and breath sounds normal.  GI: Soft. She exhibits no distension and no mass. There is no tenderness.  Musculoskeletal: She exhibits no edema.     Assessment/Plan 1.  Recurrent right colon polyp.  Plan:  Laparoscopic assisted partial colectomy.  Katherine Floyd J 09/06/2011, 7:13 AM

## 2011-09-06 NOTE — Progress Notes (Addendum)
Resumed care of patient.  Pt asleep.  Introduce self to patient.  Pt family at bedside.  NADN.  Will continue to moitor.  Pt dsg dry and intact.  No change in patient assessment

## 2011-09-07 LAB — BASIC METABOLIC PANEL
BUN: 4 mg/dL — ABNORMAL LOW (ref 6–23)
CO2: 25 mEq/L (ref 19–32)
Calcium: 8.4 mg/dL (ref 8.4–10.5)
Glucose, Bld: 154 mg/dL — ABNORMAL HIGH (ref 70–99)
Sodium: 139 mEq/L (ref 135–145)

## 2011-09-07 LAB — CBC
MCH: 27.5 pg (ref 26.0–34.0)
MCV: 83.3 fL (ref 78.0–100.0)
Platelets: 250 10*3/uL (ref 150–400)
RBC: 3.96 MIL/uL (ref 3.87–5.11)

## 2011-09-07 MED ORDER — ALUM & MAG HYDROXIDE-SIMETH 200-200-20 MG/5ML PO SUSP
15.0000 mL | ORAL | Status: DC | PRN
  Administered 2011-09-07 – 2011-09-08 (×2): 15 mL via ORAL
  Filled 2011-09-07 (×2): qty 30

## 2011-09-07 MED ORDER — PANTOPRAZOLE SODIUM 40 MG PO TBEC
40.0000 mg | DELAYED_RELEASE_TABLET | Freq: Every day | ORAL | Status: DC
  Administered 2011-09-07: 40 mg via ORAL
  Filled 2011-09-07 (×2): qty 1

## 2011-09-07 NOTE — Progress Notes (Signed)
Patient ID: Katherine Floyd, female   DOB: [birth year], 66 y.o.   MRN: [id number] 1 Day Post-Op  Subjective: Feels pretty well this morning. Some abdominal cramping, not severe. No nausea. She has not yet been up.  Objective: Vital signs in last 24 hours: Temp:  [97.4 F (36.3 C)-99.1 F (37.3 C)] 98.2 F (36.8 C) (05/11 0622) Pulse Rate:  [80-99] 93  (05/11 0622) Resp:  [10-19] 17  (05/11 0822) BP: (110-135)/(66-107) 122/69 mmHg (05/11 0622) SpO2:  [95 %-100 %] 97 % (05/11 0822) FiO2 (%):  [90 %] 90 % (05/10 2000) Weight:  [174 lb 5 oz (79.068 kg)] 174 lb 5 oz (79.068 kg) (05/10 1119) Last BM Date: 09/06/11  Intake/Output from previous day: 05/10 0701 - 05/11 0700 In: 2400 [I.V.:2400] Out: 2300 [Urine:2200; Blood:100] Intake/Output this shift:    General appearance: alert and no distress GI: normal findings: soft, non-tender Incision/Wound: dressings clean and dry  Lab Results:   [facility] 09/07/11 0440  WBC 9.4  HGB 10.9*  HCT 33.0*  PLT 250   BMET  Basename 09/07/11 0440  NA 139  K 3.9  CL 105  CO2 25  GLUCOSE 154*  BUN 4*  CREATININE 0.64  CALCIUM 8.4     Studies/Results: No results found.  Anti-infectives: Anti-infectives     Start     Dose/Rate Route Frequency Ordered Stop   09/06/11 0521   ertapenem (INVANZ) 1 g in sodium chloride 0.9 % 50 mL IVPB        1 g 100 mL/hr over 30 Minutes Intravenous 60 min pre-op 09/06/11 0521 09/06/11 0738          Assessment/Plan: s/p Procedure(s): LAPAROSCOPIC RIGHT HEMI COLECTOMY Doing well postoperatively. Start clear liquid diet. Out of bed and ambulate.   LOS: 1 day    Sophiah Rolin T 09/07/2011

## 2011-09-07 NOTE — Progress Notes (Signed)
Pt. States she has had heartburn since lunch. Belching and almost having emesis. Previously given Protonix 40mg  at 1300. Dr. Johna Sheriff notified and new order received. Pt. Up for ambulation in hallway. Continue to assess and monitor.

## 2011-09-08 MED ORDER — PANTOPRAZOLE SODIUM 40 MG PO TBEC
40.0000 mg | DELAYED_RELEASE_TABLET | Freq: Two times a day (BID) | ORAL | Status: DC
  Administered 2011-09-08 (×2): 40 mg via ORAL
  Filled 2011-09-08 (×4): qty 1

## 2011-09-08 NOTE — Progress Notes (Signed)
Patient ID: Katherine Floyd, female   DOB: 03/05/1946, 66 y.o.   MRN: [id number] 2 Days Post-Op  Subjective: Still with reflux and heartburn, belching, nausea but no vomiting. No flatus or bowel movement yet.  Objective: Vital signs in last 24 hours: Temp:  [98 F (36.7 C)-98.9 F (37.2 C)] 98 F (36.7 C) (05/12 0605) Pulse Rate:  [77-91] 91  (05/12 0605) Resp:  [16-22] 16  (05/12 0834) BP: (131-152)/(67-79) 152/79 mmHg (05/12 0605) SpO2:  [94 %-98 %] 97 % (05/12 0951) Last BM Date: 09/06/11  Intake/Output from previous day: 05/11 0701 - 05/12 0700 In: 3350.8 [I.V.:3350.8] Out: 2300 [Urine:2300] Intake/Output this shift: Total I/O In: -  Out: 950 [Urine:950]  General appearance: alert and no distress GI: normal findings: soft, non-tender Incision/Wound: dressings intact clean and dry  Lab Results:   [facility] 09/07/11 0440  WBC 9.4  HGB 10.9*  HCT 33.0*  PLT 250   BMET  Basename 09/07/11 0440  NA 139  K 3.9  CL 105  CO2 25  GLUCOSE 154*  BUN 4*  CREATININE 0.64  CALCIUM 8.4     Studies/Results: No results found.  Anti-infectives: Anti-infectives     Start     Dose/Rate Route Frequency Ordered Stop   09/06/11 0521   ertapenem (INVANZ) 1 g in sodium chloride 0.9 % 50 mL IVPB        1 g 100 mL/hr over 30 Minutes Intravenous 60 min pre-op 09/06/11 0521 09/06/11 0738          Assessment/Plan: s/p Procedure(s): LAPAROSCOPIC RIGHT HEMI COLECTOMY Stable with expected ileus contributing to reflux. Will continue just limited clear liquids today. Increase ambulation. Increase proton next to twice a day   LOS: 2 days    Daisia Slomski T 09/08/2011

## 2011-09-09 LAB — BASIC METABOLIC PANEL
CO2: 27 mEq/L (ref 19–32)
Calcium: 8.9 mg/dL (ref 8.4–10.5)
Creatinine, Ser: 0.73 mg/dL (ref 0.50–1.10)
Glucose, Bld: 124 mg/dL — ABNORMAL HIGH (ref 70–99)

## 2011-09-09 LAB — CBC
MCH: 27.7 pg (ref 26.0–34.0)
MCV: 83.7 fL (ref 78.0–100.0)
Platelets: 289 10*3/uL (ref 150–400)
RDW: 15.1 % (ref 11.5–15.5)
WBC: 10.5 10*3/uL (ref 4.0–10.5)

## 2011-09-09 MED ORDER — DIPHENHYDRAMINE HCL 50 MG/ML IJ SOLN: 12.5000 mg | Freq: Four times a day (QID) | INTRAMUSCULAR | Status: DC | PRN

## 2011-09-09 MED ORDER — ONDANSETRON HCL 4 MG/2ML IJ SOLN: 4.0000 mg | Freq: Four times a day (QID) | INTRAMUSCULAR | Status: DC | PRN

## 2011-09-09 MED ORDER — MORPHINE SULFATE (PF) 1 MG/ML IV SOLN
INTRAVENOUS | Status: DC
  Administered 2011-09-09: 1 mg via INTRAVENOUS
  Administered 2011-09-09: 13:00:00 via INTRAVENOUS
  Administered 2011-09-10: 1 mg via INTRAVENOUS
  Administered 2011-09-11: 0.3 mg via INTRAVENOUS

## 2011-09-09 MED ORDER — NALOXONE HCL 0.4 MG/ML IJ SOLN: 0.4000 mg | INTRAMUSCULAR | Status: DC | PRN

## 2011-09-09 MED ORDER — MORPHINE SULFATE (PF) 1 MG/ML IV SOLN
INTRAVENOUS | Status: AC
  Filled 2011-09-09: qty 25

## 2011-09-09 MED ORDER — SODIUM CHLORIDE 0.9 % IJ SOLN: 9.0000 mL | INTRAMUSCULAR | Status: DC | PRN

## 2011-09-09 MED ORDER — DIPHENHYDRAMINE HCL 12.5 MG/5ML PO ELIX: 12.5000 mg | ORAL_SOLUTION | Freq: Four times a day (QID) | ORAL | Status: DC | PRN

## 2011-09-09 MED ORDER — PANTOPRAZOLE SODIUM 40 MG IV SOLR
40.0000 mg | Freq: Two times a day (BID) | INTRAVENOUS | Status: DC
  Administered 2011-09-09 – 2011-09-10 (×4): 40 mg via INTRAVENOUS
  Filled 2011-09-09 (×6): qty 40

## 2011-09-09 NOTE — Progress Notes (Signed)
UR complete 

## 2011-09-09 NOTE — Progress Notes (Signed)
3 Days Post-Op  Subjective: Had an episode of vomiting earlier this AM.  Still has nausea.  No flatus or BM.  Sore around incision.  Objective: Vital signs in last 24 hours: Temp:  [97.9 F (36.6 C)-99 F (37.2 C)] 98.9 F (37.2 C) (05/13 0438) Pulse Rate:  [83-95] 83  (05/13 0438) Resp:  [16-23] 20  (05/13 0438) BP: (148-167)/(79-87) 148/84 mmHg (05/13 0438) SpO2:  [93 %-100 %] 95 % (05/13 0438) Last BM Date: 09/06/11  Intake/Output from previous day: 05/12 0701 - 05/13 0700 In: 2562 [P.O.:780; I.V.:1780; IV Piggyback:2] Out: 3375 [Urine:3375] Intake/Output this shift: Total I/O In: -  Out: 150 [Urine:150]  PE: Abd-soft, distended, no bowel sounds, incisions clean and intact  Lab Results:   Basename 09/07/11 0440  WBC 9.4  HGB 10.9*  HCT 33.0*  PLT 250   BMET  Basename 09/07/11 0440  NA 139  K 3.9  CL 105  CO2 25  GLUCOSE 154*  BUN 4*  CREATININE 0.64  CALCIUM 8.4   PT/INR No results found for this basename: LABPROT:2,INR:2 in the last 72 hours Comprehensive Metabolic Panel:    Component Value Date/Time   NA 139 09/07/2011 0440   K 3.9 09/07/2011 0440   CL 105 09/07/2011 0440   CO2 25 09/07/2011 0440   BUN 4* 09/07/2011 0440   CREATININE 0.64 09/07/2011 0440   GLUCOSE 154* 09/07/2011 0440   CALCIUM 8.4 09/07/2011 0440   AST 29 09/02/2011 1045   ALT 21 09/02/2011 1045   ALKPHOS 94 09/02/2011 1045   BILITOT 0.3 09/02/2011 1045   PROT 7.6 09/02/2011 1045   ALBUMIN 4.1 09/02/2011 1045     Studies/Results: No results found.  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  Postop ileus   LOS: 3 days   Plan: NPO.  Insert ng  tube for persistent N/V.  Check labs.   Otoniel Myhand J 09/09/2011

## 2011-09-10 NOTE — Progress Notes (Signed)
4 Days Post-Op  Subjective: Feels better today.  Passing some gas and moving her bowels.  Less nausea and no vomiting.  Objective: Vital signs in last 24 hours: Temp:  [98 F (36.7 C)-98.3 F (36.8 C)] 98.1 F (36.7 C) (05/14 0625) Pulse Rate:  [80-85] 80  (05/14 0625) Resp:  [18-23] 18  (05/14 0625) BP: (143-157)/(71-85) 143/81 mmHg (05/14 0625) SpO2:  [94 %-99 %] 97 % (05/14 0625) Last BM Date: 09/06/11  Intake/Output from previous day: 05/13 0701 - 05/14 0700 In: 100 [P.O.:100] Out: 1751 [Urine:1750; Stool:1] Intake/Output this shift:    PE: Abd-soft, less distended, active bowel sounds, incisions clean and dry  Lab Results:   [facility] 09/09/11 0855  WBC 10.5  HGB 11.9*  HCT 36.0  PLT 289   BMET  Basename 09/09/11 0855  NA 135  K 3.6  CL 98  CO2 27  GLUCOSE 124*  BUN 8  CREATININE 0.73  CALCIUM 8.9   PT/INR No results found for this basename: LABPROT:2,INR:2 in the last 72 hours Comprehensive Metabolic Panel:    Component Value Date/Time   NA 135 09/09/2011 0855   K 3.6 09/09/2011 0855   CL 98 09/09/2011 0855   CO2 27 09/09/2011 0855   BUN 8 09/09/2011 0855   CREATININE 0.73 09/09/2011 0855   GLUCOSE 124* 09/09/2011 0855   CALCIUM 8.9 09/09/2011 0855   AST 29 09/02/2011 1045   ALT 21 09/02/2011 1045   ALKPHOS 94 09/02/2011 1045   BILITOT 0.3 09/02/2011 1045   PROT 7.6 09/02/2011 1045   ALBUMIN 4.1 09/02/2011 1045   Pathology:  Serrated adenoma, no malignancy  Studies/Results: No results found.  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  Postop ileus-improving.   LOS: 4 days   Plan: Sips of clear  liquids.   Amayah Staheli J 09/10/2011

## 2011-09-11 MED ORDER — OXYCODONE-ACETAMINOPHEN 5-325 MG PO TABS: 1.0000 | ORAL_TABLET | ORAL | Status: DC | PRN

## 2011-09-11 MED ORDER — PANTOPRAZOLE SODIUM 40 MG IV SOLR
40.0000 mg | Freq: Two times a day (BID) | INTRAVENOUS | Status: DC
  Administered 2011-09-11 (×2): 40 mg via INTRAVENOUS
  Filled 2011-09-11 (×4): qty 40

## 2011-09-11 NOTE — Progress Notes (Signed)
5 Days Post-Op  Subjective:  Feeling better overall.  Tolerating clear liquids.  Bowels moving.  Objective: Vital signs in last 24 hours: Temp:  [97.9 F (36.6 C)-98.2 F (36.8 C)] 98.2 F (36.8 C) (05/15 0618) Pulse Rate:  [74-82] 74  (05/15 0618) Resp:  [16-21] 20  (05/15 0618) BP: (140-154)/(84-88) 140/86 mmHg (05/15 0618) SpO2:  [92 %-99 %] 95 % (05/15 0618) FiO2 (%):  [73 %] 73 % (05/14 2053) Last BM Date: 09/10/11  Intake/Output from previous day: 05/14 0701 - 05/15 0700 In: 2087.7 [P.O.:280; I.V.:1806.7] Out: 1702 [Urine:1700; Stool:2] Intake/Output this shift:    PE: Abd-soft, nondistended, incisions clean, dry, and intact  Lab Results:   [facility] 09/09/11 0855  WBC 10.5  HGB 11.9*  HCT 36.0  PLT 289   BMET  Basename 09/09/11 0855  NA 135  K 3.6  CL 98  CO2 27  GLUCOSE 124*  BUN 8  CREATININE 0.73  CALCIUM 8.9   PT/INR No results found for this basename: LABPROT:2,INR:2 in the last 72 hours Comprehensive Metabolic Panel:    Component Value Date/Time   NA 135 09/09/2011 0855   K 3.6 09/09/2011 0855   CL 98 09/09/2011 0855   CO2 27 09/09/2011 0855   BUN 8 09/09/2011 0855   CREATININE 0.73 09/09/2011 0855   GLUCOSE 124* 09/09/2011 0855   CALCIUM 8.9 09/09/2011 0855   AST 29 09/02/2011 1045   ALT 21 09/02/2011 1045   ALKPHOS 94 09/02/2011 1045   BILITOT 0.3 09/02/2011 1045   PROT 7.6 09/02/2011 1045   ALBUMIN 4.1 09/02/2011 1045   Pathology:  Serrated adenoma, no malignancy  Studies/Results: No results found.  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  Postop ileus-resolving   LOS: 5 days   Plan: Advance diet.   D/C PCA.  Oral analgesic.   Katherine Floyd J 09/11/2011

## 2011-09-12 MED ORDER — OXYCODONE-ACETAMINOPHEN 5-325 MG PO TABS: 1.0000 | ORAL_TABLET | ORAL | Status: AC | PRN

## 2011-09-12 NOTE — Discharge Instructions (Signed)
[facility]      [facility], [place] [phone]  OPEN ABDOMINAL SURGERY: POST OP INSTRUCTIONS  Always review your discharge instruction sheet given to you by the facility where your surgery was performed.  IF YOU HAVE DISABILITY OR FAMILY LEAVE FORMS, YOU MUST BRING THEM TO THE OFFICE FOR PROCESSING.  PLEASE DO NOT GIVE THEM TO YOUR DOCTOR.  1. A prescription for pain medication may be given to you upon discharge.  Take your pain medication as prescribed, if needed.  If narcotic pain medicine is not needed, then you may take acetaminophen (Tylenol) or ibuprofen (Advil) as needed. 2. Take your usually prescribed medications unless otherwise directed. 3. If you need a refill on your pain medication, please contact your pharmacy. They will contact our office to request authorization.  Prescriptions will not be filled after 5pm or on week-ends. 4.  A high-fiber, low fat diet can be resumed as tolerated.   Be sure to include lots of fluids daily.  5. Most patients will experience some swelling and bruising in the area of the incision. Ice pack will help. Swelling and bruising can take several days to resolve. 6. It is common to experience some constipation if taking pain medication after surgery.  Increasing fluid intake and taking a stool softener will usually help or prevent this problem from occurring.  A mild laxative (Milk of Magnesia or Miralax) should be taken according to package directions if there are no bowel movements after 48 hours. 7.  You may have steri-strips (small skin tapes) in place directly over the incision.  These strips should be left on the skin.  You may shower.  The glue will flake off over the next 2-3 weeks.  Any sutures or staples will be removed at the office during your follow-up visit. You may find that a light gauze bandage over your incision may keep your staples from being rubbed or pulled. You may shower and replace the bandage daily. 8. ACTIVITIES:  You may resume  regular (light) daily activities beginning the next day--such as daily self-care, walking, climbing stairs--gradually increasing activities as tolerated.  You may have sexual intercourse when it is comfortable.  Refrain from any heavy lifting or straining until approved by your doctor-no lifting over 10 pounds for 6-8 weeks. a. You may drive when you no longer are taking prescription pain medication, you can comfortably wear a seatbelt, and you can safely maneuver your car and apply brakes b. Return to Work: ___________________________________ 9. You should see your doctor in the office for a follow-up appointment approximately 2-3 weeks after your surgery.  Make sure that you call for this appointment within a day or two after you arrive home to insure a convenient appointment time. OTHER INSTRUCTIONS:  _____________________________________________________________ _____________________________________________________________  WHEN TO CALL YOUR DOCTOR: 1. Fever over 101.5 2. Inability to urinate 3. Nausea and/or vomiting 4. Extreme swelling or bruising 5. Continued bleeding from incision. 6. Increased pain, redness, or drainage from the incision.  The clinic staff is available to answer your questions during regular business hours.  Please don't hesitate to call and ask to speak to one of the nurses if you have concerns.  For further questions, please visit [email or web address]

## 2011-09-12 NOTE — Progress Notes (Signed)
6 Days Post-Op  Subjective:  Tolerating solid food.  Bowels moving.  Objective: Vital signs in last 24 hours: Temp:  [97.5 F (36.4 C)-98.5 F (36.9 C)] 98.5 F (36.9 C) (05/16 0530) Pulse Rate:  [73-80] 80  (05/16 0530) Resp:  [20] 20  (05/16 0530) BP: (135-157)/(77-85) 135/79 mmHg (05/16 0530) SpO2:  [93 %-97 %] 93 % (05/16 0530) Last BM Date: 09/11/11  Intake/Output from previous day: 05/15 0701 - 05/16 0700 In: 480 [P.O.:120; I.V.:360] Out: 1750 [Urine:1750] Intake/Output this shift:    PE: Abd-soft, nondistended, incisions clean, dry, and intact  Lab Results:   [facility] 09/09/11 0855  WBC 10.5  HGB 11.9*  HCT 36.0  PLT 289   BMET  Basename 09/09/11 0855  NA 135  K 3.6  CL 98  CO2 27  GLUCOSE 124*  BUN 8  CREATININE 0.73  CALCIUM 8.9   PT/INR No results found for this basename: LABPROT:2,INR:2 in the last 72 hours Comprehensive Metabolic Panel:    Component Value Date/Time   NA 135 09/09/2011 0855   K 3.6 09/09/2011 0855   CL 98 09/09/2011 0855   CO2 27 09/09/2011 0855   BUN 8 09/09/2011 0855   CREATININE 0.73 09/09/2011 0855   GLUCOSE 124* 09/09/2011 0855   CALCIUM 8.9 09/09/2011 0855   AST 29 09/02/2011 1045   ALT 21 09/02/2011 1045   ALKPHOS 94 09/02/2011 1045   BILITOT 0.3 09/02/2011 1045   PROT 7.6 09/02/2011 1045   ALBUMIN 4.1 09/02/2011 1045   Pathology:  Serrated adenoma, no malignancy  Studies/Results: No results found.  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  Discharge to home.  Instructions given.  Return to office in 2-3  weeks.   Kalli Greenfield J 09/12/2011

## 2011-09-24 NOTE — Discharge Summary (Signed)
Physician Discharge Summary  Patient ID: Katherine Floyd MRN: [id number] DOB/AGE: 1945/06/30 66 y.o.  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   Discharged Condition: good  Hospital Course: She underwent a laparoscopic assisted right colectomy 09/06/11.  She developed a postoperative ileus that eventually resolved.  She was eating, her bowels were moving, and her wounds were clean on 09/12/2011.  Her staples were removed and she was discharged that day.  Discharge instructions were given to her.  Consults: None  Significant Diagnostic Studies: none  Treatments: surgery: Laparoscopic assisted right colectomy  Discharge Exam: Blood pressure 135/79, pulse 73, temperature 98.5 F (36.9 C), temperature source Oral, resp. rate 20, height 5\' 1"  (1.549 m), weight 174 lb 5 oz (79.068 kg), SpO2 95.00%.   Disposition: 01-Home or Self Care  Discharge Orders    Future Appointments: Provider: Department: Dept Phone: Center:   10/16/2011 10:40 AM Adolph Pollack, MD [facility] [phone] None     Medication List  As of 09/24/2011  1:26 PM   STOP taking these medications         polyethylene glycol packet         TAKE these medications         ADVAIR DISKUS 100-50 MCG/DOSE Aepb   Generic drug: Fluticasone-Salmeterol   Inhale 1 puff into the lungs 2 (two) times daily.      alendronate 70 MG tablet   Commonly known as: FOSAMAX   Take 70 mg by mouth every 7 (seven) days. Pt takes on Monday        ALPRAZolam 0.5 MG tablet   Commonly known as: XANAX   Take 0.25-0.5 mg by mouth 3 (three) times daily as needed. Anxiety        aspirin EC 81 MG tablet   Take 81 mg by mouth daily with breakfast.      Black Cohosh 540 MG Caps   Take 540 mg by mouth daily.     CALCIUM-CARB 600 + D PO   Take 2 tablets by mouth daily.      carboxymethylcellulose 0.5 % Soln   Commonly known as: REFRESH PLUS   Place 1 drop into both eyes 3 (three) times daily as needed. Dry eyes          cetirizine 10 MG tablet   Commonly known as: ZYRTEC   Take 10 mg by mouth daily.      ciprofloxacin 500 MG tablet   Commonly known as: CIPRO   Take 500 mg by mouth 2 (two) times daily.      docusate sodium 100 MG capsule   Commonly known as: COLACE   Take 100 mg by mouth at bedtime.      Grape Seed 100 MG Caps   Take 100 mg by mouth daily.      hydrochlorothiazide 25 MG tablet   Commonly known as: HYDRODIURIL   Take 12.5-25 mg by mouth daily as needed. Fluid        montelukast 10 MG tablet   Commonly known as: SINGULAIR   Take 10 mg by mouth at bedtime.      omeprazole 40 MG capsule   Commonly known as: PRILOSEC   Take 40 mg by mouth daily.      PROAIR HFA 108 (90 BASE) MCG/ACT inhaler   Generic drug: albuterol  Inhale 2 puffs into the lungs every 4 (four) hours as needed. Wheezing        Red Yeast Rice 600 MG Caps   Take 1,200 mg by mouth daily.      sodium chloride 0.65 % nasal spray   Commonly known as: OCEAN   Place 1 spray into the nose 2 (two) times daily as needed. Allergies        vitamin E 400 UNIT capsule   Take 400 Units by mouth daily.             Signed: Adolph Pollack 09/24/2011, 1:26 PM

## 2011-10-09 ENCOUNTER — Encounter (INDEPENDENT_AMBULATORY_CARE_PROVIDER_SITE_OTHER): Payer: Self-pay

## 2011-10-16 ENCOUNTER — Encounter (INDEPENDENT_AMBULATORY_CARE_PROVIDER_SITE_OTHER): Payer: Self-pay | Admitting: General Surgery

## 2011-10-16 ENCOUNTER — Ambulatory Visit (INDEPENDENT_AMBULATORY_CARE_PROVIDER_SITE_OTHER): Payer: Medicare Other | Admitting: General Surgery

## 2011-10-16 VITALS — BP 134/78 | HR 74 | Ht 61.0 in | Wt 168.8 lb

## 2011-10-16 DIAGNOSIS — Z9889 Other specified postprocedural states: Secondary | ICD-10-CM

## 2011-10-16 NOTE — Progress Notes (Signed)
Operation:  Laparoscopic assisted right colectomy  Date:  Sep 06, 2011  Pathology:  Benign adenomatous  HPI: She is here for her first postoperative visit. She has been careful with her diet and activities. Her bowels are working well. She has a small amount of incisional discomfort   Physical Exam:  Gen.-she looks well and is in no acute distress.  Abdomen-soft, incisions are clean and intact the   Assessment:  Progressing well postoperatively.  Plan:  I start driving. Start high-fiber diet. Return visit one month.

## 2011-10-16 NOTE — Patient Instructions (Signed)
May drive. May lift up to 20 pounds. High fiber diet.

## 2011-11-18 ENCOUNTER — Ambulatory Visit (INDEPENDENT_AMBULATORY_CARE_PROVIDER_SITE_OTHER): Payer: Medicare Other | Admitting: General Surgery

## 2011-11-18 ENCOUNTER — Encounter (INDEPENDENT_AMBULATORY_CARE_PROVIDER_SITE_OTHER): Payer: Self-pay | Admitting: General Surgery

## 2011-11-18 VITALS — BP 120/82 | HR 76 | Temp 97.4°F | Resp 12 | Ht 61.0 in | Wt 166.2 lb

## 2011-11-18 DIAGNOSIS — Z9889 Other specified postprocedural states: Secondary | ICD-10-CM

## 2011-11-18 NOTE — Progress Notes (Signed)
Operation:  Laparoscopic assisted right colectomy  Date:  Sep 06, 2011  Pathology:  Benign adenomatous polyp  HPI: She is here for her second postoperative visit. She has been careful with her diet and activities. Her bowels are still working well. She has minimal incisional discomfort   Physical Exam:  Gen.-she looks well and is in no acute distress.  Abdomen-soft, incisions are clean and intact and solid   Assessment:  Continues to do well postoperatively.  Plan:   Activities as tolerated.  Repeat colonoscopy per Dr. Loreta Ave.

## 2011-11-18 NOTE — Patient Instructions (Signed)
Activities as tolerated.  May take Miralax if you get constipated.

## 2013-05-15 IMAGING — CR DG CHEST 2V
2 series · 2 of 2 positions shown · non-contrast
Comparison: 01/31/2011.

CLINICAL DATA: Preoperative evaluation.  History of asthma.  Ex-
smoker.

CHEST - 2 VIEW

[w chest pa]
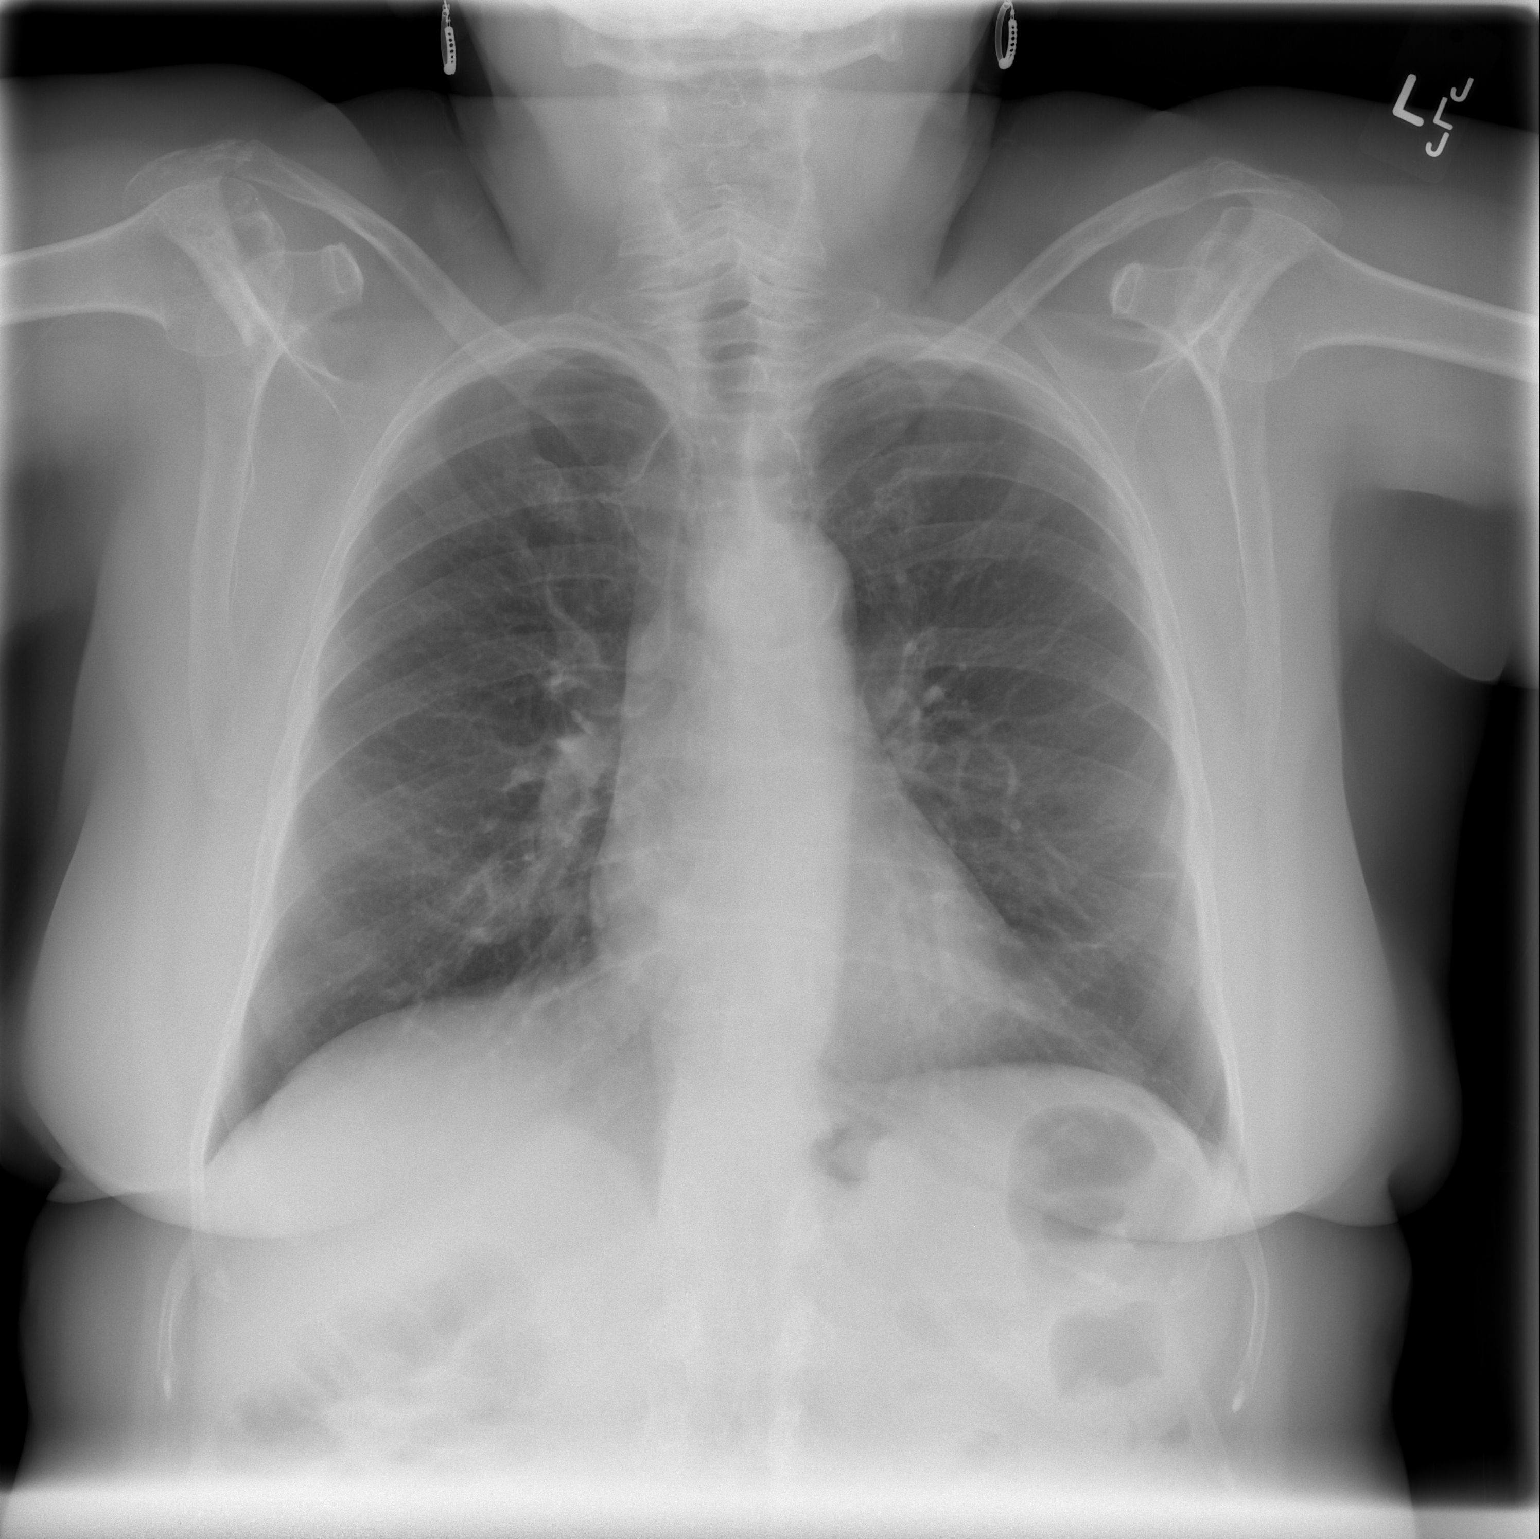

[w chest lat]
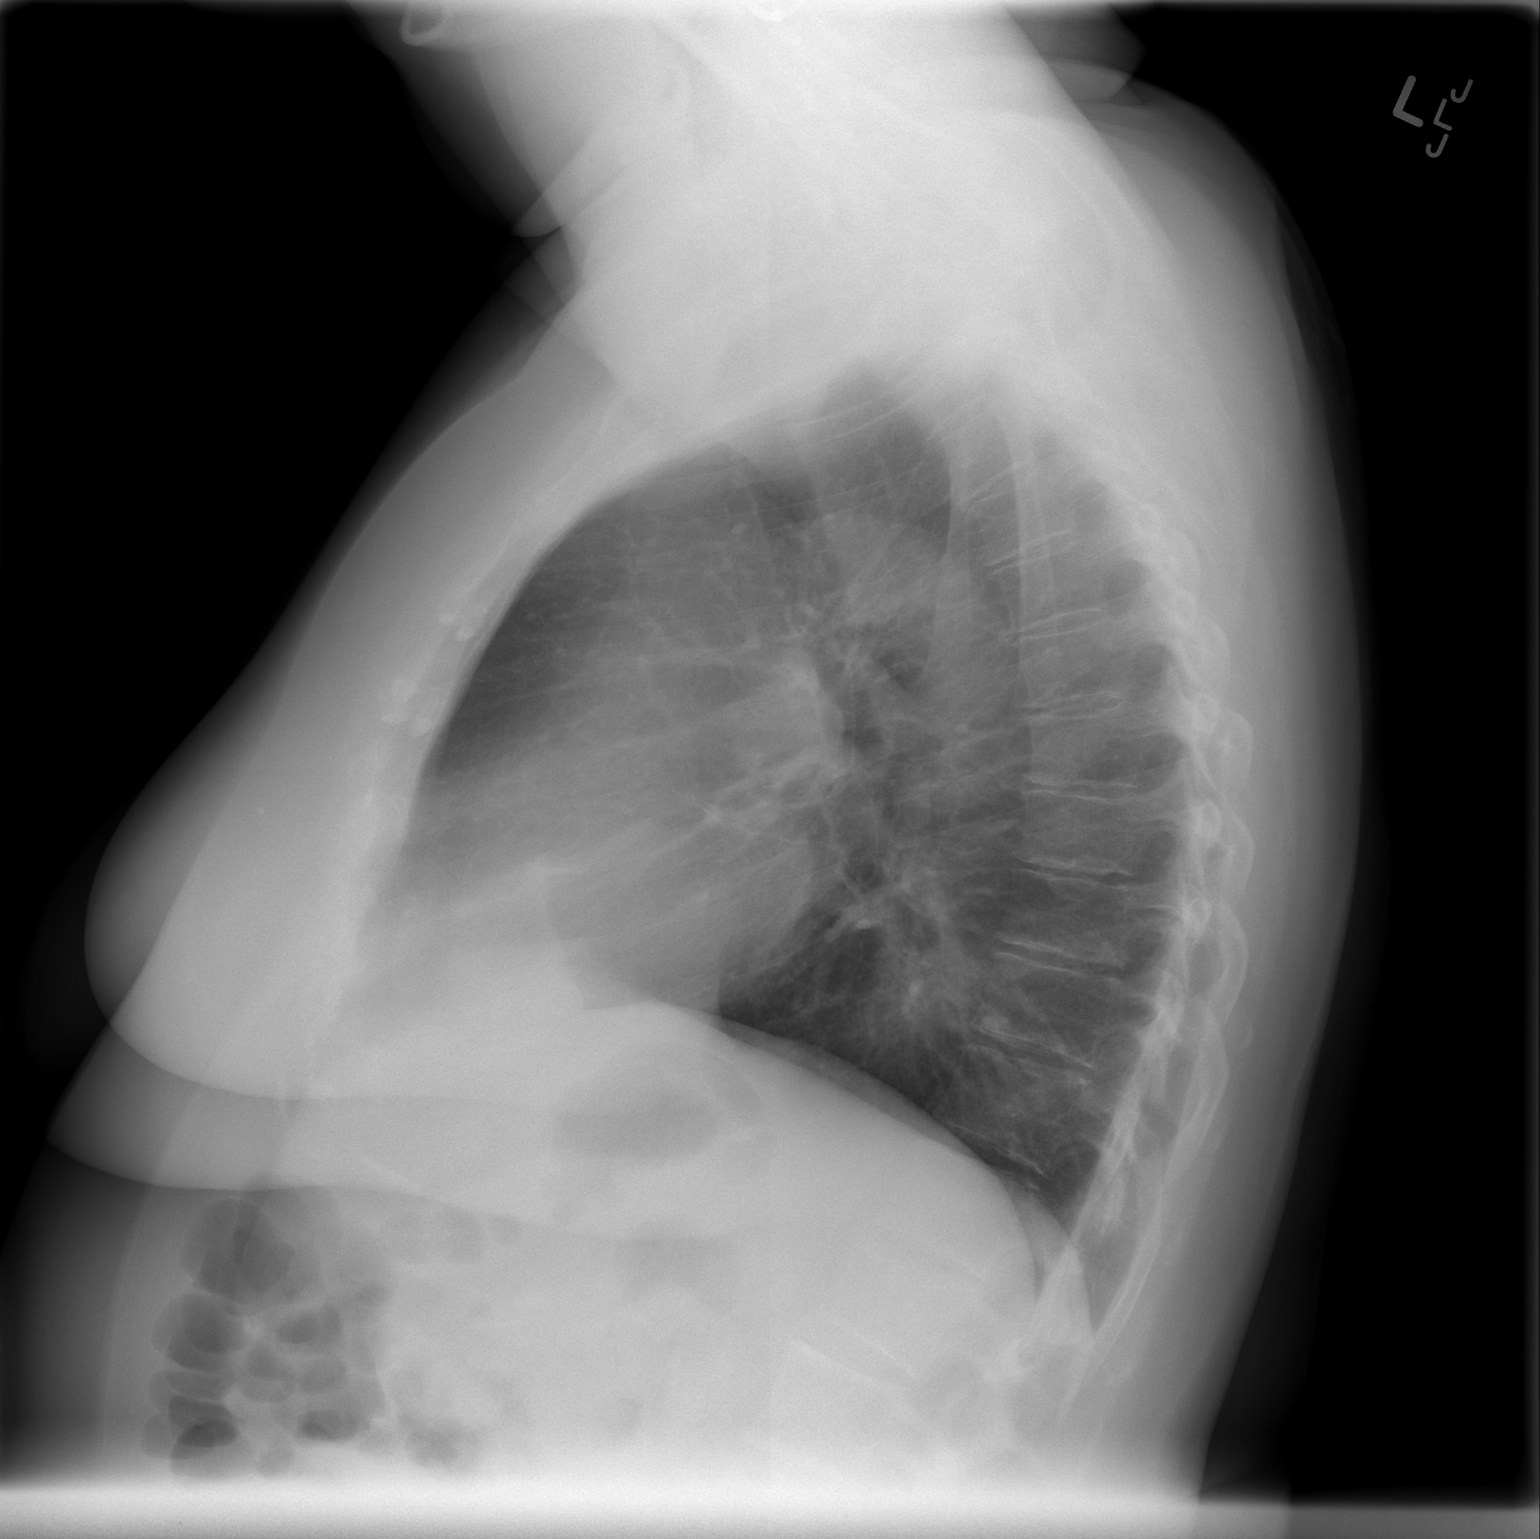

[2 of 2 positions shown; findings below may reference images not displayed]

FINDINGS: The cardiac silhouette is normal size and shape. Ectasia
and nonaneurysmal calcification of the thoracic aorta are seen.
Mediastinal and hilar contours appear stable.  No pulmonary
infiltrates or nodules are seen. No pleural abnormality is evident.
There is flattening of diaphragm on lateral image with mild
hyperinflation configuration.  There is slightly osteopenic
appearance of the bones.  Degenerative spondylosis and degenerative
disc disease changes are seen.
IMPRESSION: Hyperinflation configuration.  No pulmonary edema, pneumonia, or
pleural effusion.

## 2013-06-21 ENCOUNTER — Encounter (HOSPITAL_COMMUNITY): Payer: Self-pay | Admitting: Emergency Medicine

## 2013-06-21 ENCOUNTER — Inpatient Hospital Stay (HOSPITAL_COMMUNITY)
Admission: EM | Admit: 2013-06-21 | Discharge: 2013-06-25 | DRG: 308 | Disposition: A | Discharge status: Home / Self Care | Payer: Medicare PPO | Attending: Cardiology | Admitting: Cardiology

## 2013-06-21 ENCOUNTER — Emergency Department (HOSPITAL_COMMUNITY): Payer: Medicare PPO

## 2013-06-21 DIAGNOSIS — Z7982 Long term (current) use of aspirin: Secondary | ICD-10-CM

## 2013-06-21 DIAGNOSIS — M129 Arthropathy, unspecified: Secondary | ICD-10-CM | POA: Diagnosis present

## 2013-06-21 DIAGNOSIS — I5031 Acute diastolic (congestive) heart failure: Secondary | ICD-10-CM

## 2013-06-21 DIAGNOSIS — R112 Nausea with vomiting, unspecified: Secondary | ICD-10-CM | POA: Diagnosis present

## 2013-06-21 DIAGNOSIS — J45909 Unspecified asthma, uncomplicated: Secondary | ICD-10-CM | POA: Diagnosis present

## 2013-06-21 DIAGNOSIS — B9789 Other viral agents as the cause of diseases classified elsewhere: Secondary | ICD-10-CM | POA: Diagnosis present

## 2013-06-21 DIAGNOSIS — Z87891 Personal history of nicotine dependence: Secondary | ICD-10-CM

## 2013-06-21 DIAGNOSIS — I10 Essential (primary) hypertension: Secondary | ICD-10-CM | POA: Diagnosis present

## 2013-06-21 DIAGNOSIS — I4891 Unspecified atrial fibrillation: Principal | ICD-10-CM | POA: Diagnosis present

## 2013-06-21 DIAGNOSIS — M810 Age-related osteoporosis without current pathological fracture: Secondary | ICD-10-CM | POA: Diagnosis present

## 2013-06-21 DIAGNOSIS — K219 Gastro-esophageal reflux disease without esophagitis: Secondary | ICD-10-CM | POA: Diagnosis present

## 2013-06-21 DIAGNOSIS — J069 Acute upper respiratory infection, unspecified: Secondary | ICD-10-CM | POA: Diagnosis present

## 2013-06-21 DIAGNOSIS — Z860100 Personal history of colon polyps, unspecified: Secondary | ICD-10-CM

## 2013-06-21 DIAGNOSIS — I5033 Acute on chronic diastolic (congestive) heart failure: Secondary | ICD-10-CM | POA: Diagnosis present

## 2013-06-21 DIAGNOSIS — I509 Heart failure, unspecified: Secondary | ICD-10-CM | POA: Diagnosis present

## 2013-06-21 DIAGNOSIS — I5030 Unspecified diastolic (congestive) heart failure: Secondary | ICD-10-CM

## 2013-06-21 DIAGNOSIS — I309 Acute pericarditis, unspecified: Secondary | ICD-10-CM

## 2013-06-21 DIAGNOSIS — Z79899 Other long term (current) drug therapy: Secondary | ICD-10-CM

## 2013-06-21 DIAGNOSIS — I5032 Chronic diastolic (congestive) heart failure: Secondary | ICD-10-CM

## 2013-06-21 DIAGNOSIS — Z6834 Body mass index (BMI) 34.0-34.9, adult: Secondary | ICD-10-CM

## 2013-06-21 DIAGNOSIS — F411 Generalized anxiety disorder: Secondary | ICD-10-CM | POA: Diagnosis present

## 2013-06-21 DIAGNOSIS — E876 Hypokalemia: Secondary | ICD-10-CM | POA: Diagnosis present

## 2013-06-21 DIAGNOSIS — I499 Cardiac arrhythmia, unspecified: Secondary | ICD-10-CM

## 2013-06-21 DIAGNOSIS — E669 Obesity, unspecified: Secondary | ICD-10-CM | POA: Diagnosis present

## 2013-06-21 DIAGNOSIS — Z8679 Personal history of other diseases of the circulatory system: Secondary | ICD-10-CM

## 2013-06-21 HISTORY — DX: Cardiac arrhythmia, unspecified: I49.9

## 2013-06-21 LAB — BASIC METABOLIC PANEL
BUN: 13 mg/dL (ref 6–23)
CALCIUM: 9.2 mg/dL (ref 8.4–10.5)
CO2: 30 mEq/L (ref 19–32)
CREATININE: 0.8 mg/dL (ref 0.50–1.10)
Chloride: 94 mEq/L — ABNORMAL LOW (ref 96–112)
GFR calc Af Amer: 85 mL/min — ABNORMAL LOW (ref >90)
GFR, EST NON AFRICAN AMERICAN: 74 mL/min — AB (ref >90)
Glucose, Bld: 105 mg/dL — ABNORMAL HIGH (ref 70–99)
Potassium: 3.3 mEq/L — ABNORMAL LOW (ref 3.7–5.3)
SODIUM: 139 meq/L (ref 137–147)

## 2013-06-21 LAB — CBC
HCT: 34.8 % — ABNORMAL LOW (ref 36.0–46.0)
Hemoglobin: 11.5 g/dL — ABNORMAL LOW (ref 12.0–15.0)
MCH: 26.7 pg (ref 26.0–34.0)
MCHC: 33 g/dL (ref 30.0–36.0)
MCV: 80.7 fL (ref 78.0–100.0)
PLATELETS: 306 10*3/uL (ref 150–400)
RBC: 4.31 MIL/uL (ref 3.87–5.11)
RDW: 15.8 % — ABNORMAL HIGH (ref 11.5–15.5)
WBC: 11.7 10*3/uL — ABNORMAL HIGH (ref 4.0–10.5)

## 2013-06-21 LAB — MAGNESIUM: MAGNESIUM: 2.2 mg/dL (ref 1.5–2.5)

## 2013-06-21 LAB — TROPONIN I: Troponin I: <0.3 ng/mL (ref <0.30)

## 2013-06-21 LAB — PRO B NATRIURETIC PEPTIDE: Pro B Natriuretic peptide (BNP): 1032 pg/mL — ABNORMAL HIGH (ref 0–125)

## 2013-06-21 MED ORDER — PANTOPRAZOLE SODIUM 40 MG PO TBEC
40.0000 mg | DELAYED_RELEASE_TABLET | Freq: Every day | ORAL | Status: DC
  Administered 2013-06-22 – 2013-06-25 (×5): 40 mg via ORAL
  Filled 2013-06-21 (×5): qty 1

## 2013-06-21 MED ORDER — HEPARIN (PORCINE) IN NACL 100-0.45 UNIT/ML-% IJ SOLN: 1000.0000 [IU]/h | INTRAMUSCULAR | Status: DC

## 2013-06-21 MED ORDER — ALBUTEROL SULFATE HFA 108 (90 BASE) MCG/ACT IN AERS
2.0000 | INHALATION_SPRAY | RESPIRATORY_TRACT | Status: DC | PRN
  Administered 2013-06-21: 2 via RESPIRATORY_TRACT
  Filled 2013-06-21: qty 6.7

## 2013-06-21 MED ORDER — DILTIAZEM HCL 100 MG IV SOLR
5.0000 mg/h | Freq: Once | INTRAVENOUS | Status: AC
  Administered 2013-06-21: 5 mg/h via INTRAVENOUS

## 2013-06-21 MED ORDER — POLYVINYL ALCOHOL 1.4 % OP SOLN: 1.0000 [drp] | Freq: Three times a day (TID) | OPHTHALMIC | Status: DC | PRN

## 2013-06-21 MED ORDER — ALBUTEROL SULFATE (2.5 MG/3ML) 0.083% IN NEBU: 2.5000 mg | INHALATION_SOLUTION | RESPIRATORY_TRACT | Status: DC | PRN

## 2013-06-21 MED ORDER — ALPRAZOLAM 0.5 MG PO TABS
0.2500 mg | ORAL_TABLET | Freq: Three times a day (TID) | ORAL | Status: DC | PRN
  Administered 2013-06-23 – 2013-06-24 (×3): 0.5 mg via ORAL
  Filled 2013-06-21 (×3): qty 1

## 2013-06-21 MED ORDER — ONDANSETRON HCL 4 MG/2ML IJ SOLN
4.0000 mg | Freq: Once | INTRAMUSCULAR | Status: AC
Start: 2013-06-21 — End: 2013-06-21
  Administered 2013-06-21: 4 mg via INTRAVENOUS
  Filled 2013-06-21: qty 2

## 2013-06-21 MED ORDER — CARBOXYMETHYLCELLULOSE SODIUM 0.5 % OP SOLN: 1.0000 [drp] | Freq: Three times a day (TID) | OPHTHALMIC | Status: DC | PRN

## 2013-06-21 MED ORDER — MOMETASONE FURO-FORMOTEROL FUM 100-5 MCG/ACT IN AERO
2.0000 | INHALATION_SPRAY | Freq: Two times a day (BID) | RESPIRATORY_TRACT | Status: DC
  Administered 2013-06-22 – 2013-06-25 (×7): 2 via RESPIRATORY_TRACT
  Filled 2013-06-21 (×2): qty 8.8

## 2013-06-21 MED ORDER — ACETAMINOPHEN 325 MG PO TABS
650.0000 mg | ORAL_TABLET | Freq: Once | ORAL | Status: AC
  Administered 2013-06-22: 650 mg via ORAL
  Filled 2013-06-21: qty 2

## 2013-06-21 MED ORDER — DILTIAZEM HCL ER COATED BEADS 120 MG PO CP24
120.0000 mg | ORAL_CAPSULE | Freq: Every day | ORAL | Status: DC
  Administered 2013-06-22 (×2): 120 mg via ORAL
  Filled 2013-06-21 (×3): qty 1

## 2013-06-21 MED ORDER — HEPARIN (PORCINE) IN NACL 100-0.45 UNIT/ML-% IJ SOLN
1200.0000 [IU]/h | INTRAMUSCULAR | Status: DC
  Administered 2013-06-21: 1000 [IU]/h via INTRAVENOUS
  Administered 2013-06-22 (×2): 1200 [IU]/h via INTRAVENOUS
  Filled 2013-06-21 (×2): qty 250

## 2013-06-21 MED ORDER — HEPARIN BOLUS VIA INFUSION
4000.0000 [IU] | Freq: Once | INTRAVENOUS | Status: AC
  Administered 2013-06-21: 4000 [IU] via INTRAVENOUS
  Filled 2013-06-21: qty 4000

## 2013-06-21 MED ORDER — DILTIAZEM HCL 25 MG/5ML IV SOLN
10.0000 mg | Freq: Once | INTRAVENOUS | Status: AC
  Administered 2013-06-21: 10 mg via INTRAVENOUS
  Filled 2013-06-21: qty 5

## 2013-06-21 MED ORDER — POTASSIUM CHLORIDE CRYS ER 20 MEQ PO TBCR
40.0000 meq | EXTENDED_RELEASE_TABLET | Freq: Once | ORAL | Status: AC
  Administered 2013-06-21: 40 meq via ORAL
  Filled 2013-06-21: qty 2

## 2013-06-21 MED ORDER — FUROSEMIDE 10 MG/ML IJ SOLN
40.0000 mg | Freq: Once | INTRAMUSCULAR | Status: AC
  Administered 2013-06-21: 40 mg via INTRAVENOUS
  Filled 2013-06-21: qty 4

## 2013-06-21 MED ORDER — MONTELUKAST SODIUM 10 MG PO TABS
10.0000 mg | ORAL_TABLET | Freq: Every day | ORAL | Status: DC
  Administered 2013-06-22 – 2013-06-24 (×4): 10 mg via ORAL
  Filled 2013-06-21 (×6): qty 1

## 2013-06-21 MED ORDER — DOCUSATE SODIUM 100 MG PO CAPS
100.0000 mg | ORAL_CAPSULE | Freq: Every day | ORAL | Status: DC
  Administered 2013-06-22 (×2): 100 mg via ORAL
  Filled 2013-06-21 (×6): qty 1

## 2013-06-21 NOTE — ED Provider Notes (Signed)
CSN: [id number]     Arrival date & time 06/21/13  1440 History   First MD Initiated Contact with Patient 06/21/13 1453     Chief Complaint  Patient presents with  . Atrial Fibrillation     (Consider location/radiation/quality/duration/timing/severity/associated sxs/prior Treatment) HPI Pt presents in new onset of atrial fibrillation.  She has been having fatigue over the past week with nausea and vomiting 2 days ago.  No fever/chills.  No cough or abdominal pain.  Today she went to her PMD and was found to be in atrial fibrillation, she has no hx of this.  No fainting but has felt very weak and tired.  Symptoms are made worse with exertion.  She does not know what makes her better.  She does not feel palpitations.  Her PMD is with [facility]. There are no other associated systemic symptoms, there are no other alleviating or modifying factors.   Past Medical History  Diagnosis Date  . Asthma   . GERD (gastroesophageal reflux disease)   . Osteoporosis   . Urinary tract infection 08/29/11    Cipro per PCP- states is resolving  . Anxiety   . Kidney stone   . Polyp of colon, adenomatous     recurrent  . Arthritis     with fracture right great toe 08/30/11 from "stepping wrong"  . Hypertension    Past Surgical History  Procedure Laterality Date  . Tonsillectomy  age of 68  . Hand surgery  1994    cyst removed  . Bunionectomy  2002    right foot  . Knee surgery  1990  . Shoulder surgery  2007    right   . Abdominal hysterectomy  1984  . Back surgery  1997-1998    fell on ice and snow  . Uretheral dilitation    . Eye surgery  2000    cataract extraction with IOL  . Hemicolectomy  09/06/11   Family History  Problem Relation Age of Onset  . Cancer Mother     breast  . Cancer Maternal Uncle     colon  . Cancer Cousin     breast   History  Substance Use Topics  . Smoking status: Former Smoker -- 1.50 packs/day for 10 years    Quit date: 09/01/1988  . Smokeless tobacco:  Never Used  . Alcohol Use: No   OB History   Grav Para Term Preterm Abortions TAB SAB Ect Mult Living                 Review of Systems ROS reviewed and all otherwise negative except for mentioned in HPI    Allergies  Sulfur and Ampicillin  Home Medications   Current Outpatient Rx  Name  Route  Sig  Dispense  Refill  . ADVAIR DISKUS 100-50 MCG/DOSE AEPB   Inhalation   Inhale 1 puff into the lungs 2 (two) times daily.          Marland Kitchen. ALPRAZolam (XANAX) 0.5 MG tablet   Oral   Take 0.25-0.5 mg by mouth 3 (three) times daily as needed. Anxiety          . aspirin EC 81 MG tablet   Oral   Take 81 mg by mouth daily with breakfast.         . Black Cohosh 540 MG CAPS   Oral   Take 540 mg by mouth daily.         . Calcium Carbonate-Vitamin D (CALCIUM-CARB 600 +  D PO)   Oral   Take 2 tablets by mouth daily.         . carboxymethylcellulose (REFRESH PLUS) 0.5 % SOLN   Both Eyes   Place 1 drop into both eyes 3 (three) times daily as needed. Dry eyes           . cetirizine (ZYRTEC) 10 MG tablet   Oral   Take 10 mg by mouth daily.         Marland Kitchen docusate sodium (COLACE) 100 MG capsule   Oral   Take 100 mg by mouth at bedtime.          . Grape Seed 100 MG CAPS   Oral   Take 100 mg by mouth daily.         . hydrochlorothiazide (HYDRODIURIL) 25 MG tablet   Oral   Take 12.5-25 mg by mouth daily as needed. Fluid          . montelukast (SINGULAIR) 10 MG tablet   Oral   Take 10 mg by mouth at bedtime.          Marland Kitchen omeprazole (PRILOSEC) 40 MG capsule   Oral   Take 40 mg by mouth daily.         Marland Kitchen PROAIR HFA 108 (90 BASE) MCG/ACT inhaler   Inhalation   Inhale 2 puffs into the lungs every 4 (four) hours as needed. Wheezing          . Red Yeast Rice 600 MG CAPS   Oral   Take 1,200 mg by mouth daily.          . sodium chloride (OCEAN) 0.65 % nasal spray   Nasal   Place 1 spray into the nose 2 (two) times daily as needed. Allergies          .  vitamin E 400 UNIT capsule   Oral   Take 400 Units by mouth daily.          BP 124/69  Pulse 88  Temp(Src) 98.2 F (36.8 C) (Oral)  Resp 20  Ht 5' 1.25" (1.556 m)  Wt 181 lb (82.101 kg)  BMI 33.91 kg/m2  SpO2 94% Vitals reviewed Physical Exam Physical Examination: General appearance - alert, well appearing, and in no distress Mental status - alert, oriented to person, place, and time Eyes - no conjunctival injection, no scleral icterus Mouth - mucous membranes moist, pharynx normal without lesions Chest - clear to auscultation, no wheezes, rales or rhonchi, symmetric air entry Heart - normal rate, regular rhythm, normal S1, S2, no murmurs, rubs, clicks or gallops Abdomen - soft, nontender, nondistended, no masses or organomegaly Extremities - peripheral pulses normal, no pedal edema, no clubbing or cyanosis Skin - normal coloration and turgor, no rashes  ED Course  Procedures (including critical care time) Labs Review Labs Reviewed  CBC - Abnormal; Notable for the following:    WBC 11.7 (*)    Hemoglobin 11.5 (*)    HCT 34.8 (*)    RDW 15.8 (*)    All other components within normal limits  BASIC METABOLIC PANEL - Abnormal; Notable for the following:    Potassium 3.3 (*)    Chloride 94 (*)    Glucose, Bld 105 (*)    GFR calc non Af Amer 74 (*)    GFR calc Af Amer 85 (*)    All other components within normal limits  TSH  MAGNESIUM   Imaging Review Dg Chest 2 View  06/21/2013  CLINICAL DATA:  Three day history of chest pain, history of atrial fibrillation  EXAM: CHEST  2 VIEW  COMPARISON:  DG CHEST 2V dated 01/25/2013  FINDINGS: The lungs are adequately inflated. The interstitial markings are increased bilaterally. There is subsegmental atelectasis at the lung bases. A small amount of pleural fluid blunts the costophrenic angles. The cardiac silhouette is top-normal to mildly enlarged. Prominence of the central pulmonary vascularity is present.  IMPRESSION: Since the  earlier study bibasilar atelectasis and small effusions have developed. This may reflect mild CHF given the appearance of the cardiac silhouette and pulmonary vascularity. There is no classic alveolar pneumonia.   Electronically Signed   By: David  Martinique   On: 06/21/2013 15:48    EKG Interpretation    Date/Time:  Monday June 21 2013 15:08:17 EST Ventricular Rate:  88 PR Interval:  149 QRS Duration: 93 QT Interval:  334 QTC Calculation: 404 R Axis:   0 Text Interpretation:  Sinus tachycardia Atrial premature complexes Low voltage, extremity leads Since previous tracing rapid afib has resolved Confirmed by Canary Brim  MD, [place] [phone]) on 06/21/2013 4:10:33 PM          CRITICAL CARE Performed by: Threasa Beards Total critical care time: 40 Critical care time was exclusive of separately billable procedures and treating other patients. Critical care was necessary to treat or prevent imminent or life-threatening deterioration. Critical care was time spent personally by me on the following activities: development of treatment plan with patient and/or surrogate as well as nursing, discussions with consultants, evaluation of patient's response to treatment, examination of patient, obtaining history from patient or surrogate, ordering and performing treatments and interventions, ordering and review of laboratory studies, ordering and review of radiographic studies, pulse oximetry and re-evaluation of patient's condition.  MDM   Final diagnoses:  Atrial fibrillation with rapid ventricular response  Hypokalemia    Pt presenting with new onset rapid afib after one week of fatigue with some vomiting.  Initial HR 160 in afib, pt started on diltiazem and dilt drip, repeat EKG shows sinus with PACs at rate of 88.  I have talked with cardiology on call, Wannetta Sender- who will have someone from cardiology come to evaluate patient in the ED.  She has mild hypokalemia- potassium repleted and magnesium  pending.     Threasa Beards, MD 06/21/13 1630

## 2013-06-21 NOTE — ED Notes (Signed)
Pt reports N/V/D on Saturday and Sunday, denies any today. States she has just been "feeling really weak" with chills. Pt went to PCP to be evaluated and found to be in new onset of A fib RVR. Pt denies any CP, SOB or other complaint besides continued weakness. PT AO x4. NAD. Rate on monitor ranging between 130-190's.

## 2013-06-21 NOTE — Progress Notes (Signed)
ANTICOAGULATION CONSULT NOTE - Initial Consult  Pharmacy Consult for Heparin Indication: atrial fibrillation  Allergies  Allergen Reactions  . Sulfur Itching  . Ampicillin Itching and Rash    Patient Measurements: Height: 5' 1.25" (155.6 cm) Weight: 181 lb (82.101 kg) IBW/kg (Calculated) : 48.38 Heparin Dosing Weight: ~67kg  Vital Signs: Temp: 98.2 F (36.8 C) (02/23 1448) Temp src: Oral (02/23 1448) BP: 124/69 mmHg (02/23 1604) Pulse Rate: 88 (02/23 1604)  Labs:  Recent Labs  06/21/13 1505  HGB 11.5*  HCT 34.8*  PLT 306  CREATININE 0.80    Estimated Creatinine Clearance: 64.9 ml/min (by C-G formula based on Cr of 0.8).   Medical History: Past Medical History  Diagnosis Date  . Asthma   . GERD (gastroesophageal reflux disease)   . Osteoporosis   . Urinary tract infection 08/29/11    Cipro per PCP- states is resolving  . Anxiety   . Kidney stone   . Polyp of colon, adenomatous     recurrent  . Arthritis     with fracture right great toe 08/30/11 from "stepping wrong"  . Hypertension     Medications:  No anticoagulants pta  Assessment: 68yof presents to the ED after a week of fatigue and vomiting. Found to be in afib with RVR - new onset. She will begin IV heparin. Baseline renal function and CBC wnl.  Goal of Therapy:  Heparin level 0.3-0.7 units/ml Monitor platelets by anticoagulation protocol: Yes   Plan:  1) Heparin bolus 4000 units x 1 2) Heparin drip at 1000 units/hr 3) 8 hour heparin level 4) Daily heparin level and CBC  Deboraha Sprang 06/21/2013,4:32 PM

## 2013-06-21 NOTE — H&P (Signed)
Katherine Floyd is an 68 y.o. female.   Chief Complaint:  Afib RVR HPI:   The patient is a 68 yo female with a history of HTN, tobacco abuse(10-15PY, quit in 1990), GERD, asthma.  Both parents had CAD and stents.  She presents from her PCP with new onset afib.  She was shoveling snow last Tuesday.  After going back inside she felt very cold and chilled.  She subsequently developed pain in her left collar bone which radiated to her neck and has persisted but improved with a heating pad.  The area was also tender to touch.  On Saturday she developed N and V.  She went to see Dr. Deatra Ina who did an EKG which showed a fib RVR.  She was then directed to ER.  The patient currently denies fever, chest pain, orthopnea, dizziness, PND, cough, congestion, abdominal pain, hematochezia, melena, lower extremity edema, claudication.   She did have some shortness of breath while walking to the bathroom in the ER and O2 sat was in the upper 80's.   Medications: Prior to Admission medications   Medication Sig Start Date End Date Taking? Authorizing Provider  ADVAIR DISKUS 100-50 MCG/DOSE AEPB Inhale 1 puff into the lungs 2 (two) times daily.  07/15/11  Yes Historical Provider, MD  ALPRAZolam Duanne Moron) 0.5 MG tablet Take 0.25-0.5 mg by mouth 3 (three) times daily as needed. Anxiety  06/17/11  Yes Historical Provider, MD  aspirin EC 81 MG tablet Take 81 mg by mouth daily with breakfast.   Yes Historical Provider, MD  Black Cohosh 540 MG CAPS Take 540 mg by mouth daily.   Yes Historical Provider, MD  Calcium Carbonate-Vitamin D (CALCIUM-CARB 600 + D PO) Take 2 tablets by mouth daily.   Yes Historical Provider, MD  carboxymethylcellulose (REFRESH PLUS) 0.5 % SOLN Place 1 drop into both eyes 3 (three) times daily as needed. Dry eyes     Yes Historical Provider, MD  cetirizine (ZYRTEC) 10 MG tablet Take 10 mg by mouth daily.   Yes Historical Provider, MD  docusate sodium (COLACE) 100 MG capsule Take 100 mg by mouth at  bedtime.    Yes Historical Provider, MD  Grape Seed 100 MG CAPS Take 100 mg by mouth daily.   Yes Historical Provider, MD  hydrochlorothiazide (HYDRODIURIL) 25 MG tablet Take 12.5-25 mg by mouth daily as needed. Fluid    Yes Historical Provider, MD  montelukast (SINGULAIR) 10 MG tablet Take 10 mg by mouth at bedtime.  07/17/11  Yes Historical Provider, MD  omeprazole (PRILOSEC) 40 MG capsule Take 40 mg by mouth daily.   Yes Historical Provider, MD  PROAIR HFA 108 (90 BASE) MCG/ACT inhaler Inhale 2 puffs into the lungs every 4 (four) hours as needed. Wheezing  07/15/11  Yes Historical Provider, MD  Red Yeast Rice 600 MG CAPS Take 1,200 mg by mouth daily.    Yes Historical Provider, MD  sodium chloride (OCEAN) 0.65 % nasal spray Place 1 spray into the nose 2 (two) times daily as needed. Allergies    Yes Historical Provider, MD  vitamin E 400 UNIT capsule Take 400 Units by mouth daily.   Yes Historical Provider, MD      Past Medical History  Diagnosis Date  . Asthma   . GERD (gastroesophageal reflux disease)   . Osteoporosis   . Urinary tract infection 08/29/11    Cipro per PCP- states is resolving  . Anxiety   . Kidney stone   . Polyp  of colon, adenomatous     recurrent  . Arthritis     with fracture right great toe 08/30/11 from "stepping wrong"  . Hypertension     Past Surgical History  Procedure Laterality Date  . Tonsillectomy  age of 59  . Hand surgery  1994    cyst removed  . Bunionectomy  2002    right foot  . Knee surgery  1990  . Shoulder surgery  2007    right   . Abdominal hysterectomy  1984  . Back surgery  1997-1998    fell on ice and snow  . Uretheral dilitation    . Eye surgery  2000    cataract extraction with IOL  . Hemicolectomy  09/06/11    Family History  Problem Relation Age of Onset  . Cancer Mother     breast  . Cancer Maternal Uncle     colon  . Cancer Cousin     breast   Social History:  reports that she quit smoking about 24 years ago. She  has never used smokeless tobacco. She reports that she does not drink alcohol or use illicit drugs.  Allergies:  Allergies  Allergen Reactions  . Sulfur Itching  . Ampicillin Itching and Rash     (Not in a hospital admission)  Results for orders placed during the hospital encounter of 06/21/13 (from the past 48 hour(s))  CBC     Status: Abnormal   Collection Time    06/21/13  3:05 PM      Result Value Ref Range   WBC 11.7 (*) 4.0 - 10.5 K/uL   RBC 4.31  3.87 - 5.11 MIL/uL   Hemoglobin 11.5 (*) 12.0 - 15.0 g/dL   HCT 34.8 (*) 36.0 - 46.0 %   MCV 80.7  78.0 - 100.0 fL   MCH 26.7  26.0 - 34.0 pg   MCHC 33.0  30.0 - 36.0 g/dL   RDW 15.8 (*) 11.5 - 15.5 %   Platelets 306  150 - 400 K/uL  BASIC METABOLIC PANEL     Status: Abnormal   Collection Time    06/21/13  3:05 PM      Result Value Ref Range   Sodium 139  137 - 147 mEq/L   Potassium 3.3 (*) 3.7 - 5.3 mEq/L   Chloride 94 (*) 96 - 112 mEq/L   CO2 30  19 - 32 mEq/L   Glucose, Bld 105 (*) 70 - 99 mg/dL   BUN 13  6 - 23 mg/dL   Creatinine, Ser 0.80  0.50 - 1.10 mg/dL   Calcium 9.2  8.4 - 10.5 mg/dL   GFR calc non Af Amer 74 (*) >90 mL/min   GFR calc Af Amer 85 (*) >90 mL/min   Comment: (NOTE)     The eGFR has been calculated using the CKD EPI equation.     This calculation has not been validated in all clinical situations.     eGFR's persistently <90 mL/min signify possible Chronic Kidney     Disease.  MAGNESIUM     Status: None   Collection Time    06/21/13  3:06 PM      Result Value Ref Range   Magnesium 2.2  1.5 - 2.5 mg/dL   Dg Chest 2 View  06/21/2013   CLINICAL DATA:  Three day history of chest pain, history of atrial fibrillation  EXAM: CHEST  2 VIEW  COMPARISON:  DG CHEST 2V dated 01/25/2013  FINDINGS:  The lungs are adequately inflated. The interstitial markings are increased bilaterally. There is subsegmental atelectasis at the lung bases. A small amount of pleural fluid blunts the costophrenic angles. The  cardiac silhouette is top-normal to mildly enlarged. Prominence of the central pulmonary vascularity is present.  IMPRESSION: Since the earlier study bibasilar atelectasis and small effusions have developed. This may reflect mild CHF given the appearance of the cardiac silhouette and pulmonary vascularity. There is no classic alveolar pneumonia.   Electronically Signed   By: David  Martinique   On: 06/21/2013 15:48    Review of Systems  Constitutional: Negative for fever, chills and diaphoresis.  HENT: Negative for congestion and sore throat.   Respiratory: Positive for shortness of breath (With ambulation to the bathroom while in the ER.). Negative for cough.   Cardiovascular: Negative for chest pain, orthopnea, claudication, leg swelling and PND.  Gastrointestinal: Positive for nausea (Saturday.  None recently) and vomiting. Negative for abdominal pain, blood in stool and melena.  Genitourinary: Negative for hematuria.  Musculoskeletal: Positive for myalgias and neck pain.       At left collar bone.  Neurological: Negative for dizziness and weakness.  All other systems reviewed and are negative.    Blood pressure 118/67, pulse 81, temperature 98.2 F (36.8 C), temperature source Oral, resp. rate 20, height 5' 1.25" (1.556 m), weight 181 lb (82.101 kg), SpO2 92.00%. Physical Exam  Constitutional: She is oriented to person, place, and time. She appears well-developed. No distress.  Obese  HENT:  Head: Normocephalic and atraumatic.  Mouth/Throat: No oropharyngeal exudate.  Eyes: Conjunctivae are normal. Pupils are equal, round, and reactive to light. No scleral icterus.  Neck: Normal range of motion. Neck supple.  Cardiovascular: Normal rate, regular rhythm, S1 normal and S2 normal.   No murmur heard. Pulses:      Radial pulses are 2+ on the right side, and 2+ on the left side.       Dorsalis pedis pulses are 1+ on the right side, and 1+ on the left side.  No carotid bruit  Respiratory:  Effort normal. She has no wheezes. She has no rales.  BS decreased.  GI: Soft. Bowel sounds are normal. She exhibits no distension. There is no tenderness.  Musculoskeletal: She exhibits no edema.  Lymphadenopathy:    She has no cervical adenopathy.  Neurological: She is alert and oriented to person, place, and time. She exhibits normal muscle tone.  Skin: Skin is warm and dry.  Psychiatric: She has a normal mood and affect.     Assessment/Plan Principal Problem:   Atrial fibrillation with RVR Active Problems:   Obesity   HTN (hypertension)   Asthma  Plan: 68 yo female with history of HTN, GERD, obesity.  She presents with new onset Afib RVR.  Converted to NSR with PACs with cardizem.  ON IV heparin.   CHADSVAC = 3.  Will change to PO cardizem 159m.  Change heparin to NOAC.  Cycle troponin.  Echo tomorrow.  OP NST.     HAGER, BRYAN 06/21/2013, 7:01 PM   Attending note:  Patient seen and examined. Reviewed available records and discussed the case with Mr. HLucia Gaskins She presents with newly documented atrial fibrillation with RVR, seen by her primary care provider today with recent viral symptoms including subjective fevers and chills, nausea, malaise. She had been shoveling snow last Tuesday, also complaining of left clavicular pain, somewhat musculoskeletal in description. She has no prior history of atrial fibrillation area on  evaluation in the ER she was placed on heparin and diltiazem infusions, spontaneously converted to sinus rhythm with rate control. CHADSVASC score is 3 at this point. Plan is to admit her to the hospital, transitioned from intravenous Cardizem to Cardizem CD 120 mg daily. Cycle set of cardiac markers and obtain a followup echocardiogram tomorrow to assess cardiac structure and function. We discussed options for stroke prophylaxis and have agreed on initiating Eliquis 5 mg twice daily. Patient's niece and daughter were both present (involved in healthcare) and  participated in this discussion. If she remains clinically stable tomorrow and her echocardiogram does not demonstrate any substantial abnormalities that need further inpatient workup, she may be able to be discharged with plan for a followup outpatient stress test and office visit. For now, general consensus is that she wanted to follow up in [place] office.  Satira Sark, M.D., F.A.C.C.

## 2013-06-21 NOTE — ED Notes (Signed)
Pt's O2 at 89% on RA. Pt placed on 2L [place] and increased to 95%.

## 2013-06-21 NOTE — ED Notes (Addendum)
Offered pt to use the bathroom, family feels more comfortable with using the bedpan instead of walking to the bathroom.

## 2013-06-21 NOTE — ED Notes (Signed)
Per EMS: Pt from PCP being evaluated for  N/D x 4 days and found to be in new onset of A fib; rate 90-190's. Pt denies CP/ N/V/SOB. AO x4. 130/86. 100% RA. 16 RR.

## 2013-06-21 NOTE — ED Notes (Addendum)
Contacted cardiology about pt's HA, need for bed request and confirm to continue Cardizem drip.

## 2013-06-21 NOTE — ED Notes (Signed)
PT finished meal, feeling slightly nauseous.

## 2013-06-21 NOTE — ED Notes (Signed)
Pt noted to be in sinus rhythm, rate in 90's. Repeat EKG captured.

## 2013-06-21 NOTE — ED Notes (Signed)
Family at bedside. Meal tray ordered.

## 2013-06-21 NOTE — ED Notes (Signed)
Cardiology at bedside.

## 2013-06-21 NOTE — ED Notes (Signed)
Pt ambulated to restroom with stand by assist. Pt noted to be short of breath with exertion. O2 stats checked; 87%. Pt returned to bed and 02 increased to 94% on RA. Pt in NAD.

## 2013-06-21 NOTE — ED Notes (Signed)
Patient is resting.  Alert and oriented.  Remains on cardizem drip at 5mg /hour.  Patient denies any chest pain.  heartrate is  83 at this time.  Awaiting cardiology consult.

## 2013-06-21 NOTE — ED Notes (Signed)
Attempted to give reportx1 

## 2013-06-22 ENCOUNTER — Encounter (HOSPITAL_COMMUNITY): Payer: Self-pay | Admitting: General Practice

## 2013-06-22 DIAGNOSIS — I509 Heart failure, unspecified: Secondary | ICD-10-CM | POA: Diagnosis present

## 2013-06-22 DIAGNOSIS — J069 Acute upper respiratory infection, unspecified: Secondary | ICD-10-CM | POA: Diagnosis present

## 2013-06-22 DIAGNOSIS — I5030 Unspecified diastolic (congestive) heart failure: Secondary | ICD-10-CM

## 2013-06-22 DIAGNOSIS — I5031 Acute diastolic (congestive) heart failure: Secondary | ICD-10-CM

## 2013-06-22 DIAGNOSIS — I5032 Chronic diastolic (congestive) heart failure: Secondary | ICD-10-CM

## 2013-06-22 LAB — CBC
HCT: 32.8 % — ABNORMAL LOW (ref 36.0–46.0)
Hemoglobin: 10.6 g/dL — ABNORMAL LOW (ref 12.0–15.0)
MCH: 26.3 pg (ref 26.0–34.0)
MCHC: 32.3 g/dL (ref 30.0–36.0)
MCV: 81.4 fL (ref 78.0–100.0)
PLATELETS: 275 10*3/uL (ref 150–400)
RBC: 4.03 MIL/uL (ref 3.87–5.11)
RDW: 16.3 % — ABNORMAL HIGH (ref 11.5–15.5)
WBC: 11.6 10*3/uL — ABNORMAL HIGH (ref 4.0–10.5)

## 2013-06-22 LAB — TROPONIN I
Troponin I: <0.3 ng/mL (ref <0.30)
Troponin I: <0.3 ng/mL (ref <0.30)

## 2013-06-22 LAB — HEPARIN LEVEL (UNFRACTIONATED)
HEPARIN UNFRACTIONATED: 0.48 [IU]/mL (ref 0.30–0.70)
Heparin Unfractionated: 0.14 IU/mL — ABNORMAL LOW (ref 0.30–0.70)

## 2013-06-22 LAB — TSH: TSH: 2.187 u[IU]/mL (ref 0.350–4.500)

## 2013-06-22 LAB — MRSA PCR SCREENING: MRSA BY PCR: NEGATIVE

## 2013-06-22 MED ORDER — POTASSIUM CHLORIDE CRYS ER 20 MEQ PO TBCR
40.0000 meq | EXTENDED_RELEASE_TABLET | Freq: Once | ORAL | Status: AC
  Administered 2013-06-22: 15:00:00 40 meq via ORAL
  Filled 2013-06-22: qty 2

## 2013-06-22 MED ORDER — HEPARIN BOLUS VIA INFUSION
2000.0000 [IU] | Freq: Once | INTRAVENOUS | Status: AC
  Administered 2013-06-22: 2000 [IU] via INTRAVENOUS
  Filled 2013-06-22: qty 2000

## 2013-06-22 MED ORDER — DILTIAZEM HCL 100 MG IV SOLR
5.0000 mg/h | Freq: Once | INTRAVENOUS | Status: AC
  Administered 2013-06-22: 02:00:00 10 mg/h via INTRAVENOUS
  Filled 2013-06-22: qty 100

## 2013-06-22 MED ORDER — ACETAMINOPHEN 325 MG PO TABS
650.0000 mg | ORAL_TABLET | Freq: Four times a day (QID) | ORAL | Status: DC | PRN
  Administered 2013-06-22 – 2013-06-23 (×4): 650 mg via ORAL
  Filled 2013-06-22 (×4): qty 2

## 2013-06-22 MED ORDER — FUROSEMIDE 10 MG/ML IJ SOLN
40.0000 mg | Freq: Once | INTRAMUSCULAR | Status: AC
  Administered 2013-06-22: 15:00:00 40 mg via INTRAVENOUS
  Filled 2013-06-22: qty 4

## 2013-06-22 MED ORDER — OFF THE BEAT BOOK
Freq: Once | Status: AC
  Administered 2013-06-22: 08:00:00
  Filled 2013-06-22: qty 1

## 2013-06-22 MED ORDER — ALUM & MAG HYDROXIDE-SIMETH 200-200-20 MG/5ML PO SUSP
30.0000 mL | ORAL | Status: DC | PRN
  Administered 2013-06-22 – 2013-06-23 (×3): 30 mL via ORAL
  Filled 2013-06-22 (×3): qty 30

## 2013-06-22 MED ORDER — DILTIAZEM HCL 100 MG IV SOLR
5.0000 mg/h | INTRAVENOUS | Status: DC
  Administered 2013-06-22: 5 mg/h via INTRAVENOUS
  Filled 2013-06-22: qty 100

## 2013-06-22 MED ORDER — DILTIAZEM HCL ER COATED BEADS 120 MG PO CP24
120.0000 mg | ORAL_CAPSULE | Freq: Two times a day (BID) | ORAL | Status: DC
  Administered 2013-06-22 – 2013-06-25 (×6): 120 mg via ORAL
  Filled 2013-06-22 (×9): qty 1

## 2013-06-22 MED ORDER — DILTIAZEM HCL 100 MG IV SOLR
5.0000 mg/h | INTRAVENOUS | Status: AC
  Filled 2013-06-22: qty 100

## 2013-06-22 MED ORDER — ENOXAPARIN SODIUM 100 MG/ML ~~LOC~~ SOLN
85.0000 mg | Freq: Two times a day (BID) | SUBCUTANEOUS | Status: DC
  Administered 2013-06-22: 85 mg via SUBCUTANEOUS
  Administered 2013-06-23: 07:00:00 via SUBCUTANEOUS
  Filled 2013-06-22 (×4): qty 1

## 2013-06-22 NOTE — Progress Notes (Signed)
Notified Dr.Mclean that patient went back into Afib with HR 150's this was after IV diltiazem was stopped one hour  after PO dose given. Patient a symptomatic and B/P stable. Per his order I did restart IV diltiazem and continue to monitor.

## 2013-06-22 NOTE — Progress Notes (Signed)
Subjective:  Currently no complaints. In and out of PAF last night, currently NSR after IV Diltiazem resumed.   Objective:  Vital Signs in the last 24 hours: Temp:  [98 F (36.7 C)-100 F (37.8 C)] 98.4 F (36.9 C) (02/24 0803) Pulse Rate:  [67-160] 73 (02/24 0803) Resp:  [16-28] 18 (02/24 0803) BP: (92-146)/(52-91) 103/66 mmHg (02/24 0803) SpO2:  [90 %-97 %] 96 % (02/24 0951) Weight:  [181 lb (82.101 kg)-181 lb 10.5 oz (82.4 kg)] 181 lb 10.5 oz (82.4 kg) (02/23 2305)  Intake/Output from previous day:  Intake/Output Summary (Last 24 hours) at 06/22/13 1044 Last data filed at 06/22/13 0900  Gross per 24 hour  Intake 306.83 ml  Output   2425 ml  Net -2118.17 ml    Physical Exam: General appearance: alert, cooperative, no distress and morbidly obese Lungs: decreased breath sounds bilat Heart: regular rate and rhythm   Rate: 74  Rhythm: normal sinus rhythm  Lab Results:  Recent Labs  06/21/13 1505 06/22/13 0840  WBC 11.7* 11.6*  HGB 11.5* 10.6*  PLT 306 275    Recent Labs  06/21/13 1505  NA 139  K 3.3*  CL 94*  CO2 30  GLUCOSE 105*  BUN 13  CREATININE 0.80    Recent Labs  06/22/13 0510 06/22/13 0840  TROPONINI <0.30 <0.30   No results found for this basename: INR,  in the last 72 hours  Imaging: Imaging results have been reviewed  Cardiac Studies:  Assessment/Plan:   Principal Problem:   Atrial fibrillation with RVR Active Problems:   Asthma   Viral URI   Acute CHF- etiology undetermined   Obesity-BMI 34   HTN (hypertension)    PLAN: PAF in the setting of underlying pulmonary disease, obesity, suspected sleep apnea, HTN, and recent viral URI. She diuresed 2L after 40 mg Lasix IV yesterday x 1, will repeat today. Add K+. Echo ordered, would not start Eliquis until EF known in case she needs a cath. Doubt she can be discharged today.   Kerin Ransom PA-C [facility] [phone] 06/22/2013, 10:44 AM

## 2013-06-22 NOTE — Progress Notes (Signed)
Heparin per pharmacy  [phone] presents to the ED after a week of fatigue and vomiting. Found to be in afib with RVR - new onset. She was begun on IV heparin. Baseline renal function and CBC wnl.  Heparin level this am was 0.14 Infusing OK Had converted to NSR, now back in Afib. Would not start Eliquis until EF known in case she needs a cath.   Repeat heparin level 0.48  Goal of Therapy:  Heparin level 0.3-0.7 units/ml Monitor platelets by anticoagulation protocol: Yes  ID: none Card: HTN.  VSS.  On dilt Pulm: Hx of asthma on dulera and singulair Endo/GI: GERD on protonix Hem/Onc: Hgb down to 10.6 and Plt 275 K   Plan:  1) Continue heparin at 1200 units/hr 2) Heparin level and CBC in am

## 2013-06-22 NOTE — Progress Notes (Signed)
The patient has had a virus for 5 days. She presented yesterday with dyspnea, wheezing and found to be in a fib with RVR. Now back in NSR with PAC's. Breath sounds decreased at the bases. BNP    Component Value Date/Time   PROBNP 1032.0* 06/21/2013 2124   Assessment:  A fib with RVR, now resolved Acute diastolic HF due to AF  Plan: as outlined with further diuresis; echo; and convert to oral meds including anticoagulant. Won't need cath with negative markers and no chest pain.

## 2013-06-22 NOTE — Progress Notes (Signed)
[place] for Heparin Indication: atrial fibrillation  Allergies  Allergen Reactions  . Sulfur Itching  . Ampicillin Itching and Rash    Patient Measurements: Height: 5\' 1"  (154.9 cm) Weight: 181 lb 10.5 oz (82.4 kg) IBW/kg (Calculated) : 47.8 Heparin Dosing Weight: ~67kg  Vital Signs: Temp: 99 F (37.2 C) (02/23 2305) Temp src: Oral (02/23 2305) BP: 125/59 mmHg (02/23 2305) Pulse Rate: 90 (02/23 2305)  Labs:  Recent Labs  06/21/13 1505 06/21/13 2124 06/22/13 0100  HGB 11.5*  --   --   HCT 34.8*  --   --   PLT 306  --   --   HEPARINUNFRC  --   --  0.14*  CREATININE 0.80  --   --   TROPONINI  --  <0.30  --     Estimated Creatinine Clearance: 64.5 ml/min (by C-G formula based on Cr of 0.8).  Assessment: 68 yo female with Afib for heparin Goal of Therapy:  Heparin level 0.3-0.7 units/ml Monitor platelets by anticoagulation protocol: Yes   Plan:  Heparin 2000 units IV bolus, then increase heparin 1200 units/hr Check heparin level in 6 hours.  Caryl Pina 06/22/2013,2:02 AM

## 2013-06-22 NOTE — Progress Notes (Addendum)
Patient in and out Afib and NSR .

## 2013-06-22 NOTE — Progress Notes (Signed)
UR completed Genetta Fiero K. Kraven Calk, RN, BSN, MSHL, CCM  06/22/2013 11:39 AM

## 2013-06-22 NOTE — Progress Notes (Signed)
[place] for Lovenox Indication: atrial fibrillation  Allergies  Allergen Reactions  . Sulfur Itching  . Ampicillin Itching and Rash    Patient Measurements: Height: 5\' 1"  (154.9 cm) Weight: 181 lb 10.5 oz (82.4 kg) IBW/kg (Calculated) : 47.8 Heparin Dosing Weight: ~67kg  Vital Signs: Temp: 98.1 F (36.7 C) (02/24 1206) Temp src: Oral (02/24 1206) BP: 119/60 mmHg (02/24 1206) Pulse Rate: 81 (02/24 1206)  Labs:  Recent Labs  06/21/13 1505 06/21/13 2124 06/22/13 0100 06/22/13 0510 06/22/13 0840  HGB 11.5*  --   --   --  10.6*  HCT 34.8*  --   --   --  32.8*  PLT 306  --   --   --  275  HEPARINUNFRC  --   --  0.14*  --  0.48  CREATININE 0.80  --   --   --   --   TROPONINI  --  <0.30  --  <0.30 <0.30    Estimated Creatinine Clearance: 64.5 ml/min (by C-G formula based on Cr of 0.8).  Assessment: 68 yo female with Afib for lovenox Goal of Therapy:  Anti Xa level 0.6-1 units/ml 4 hours after dose Monitor platelets by anticoagulation protocol: Yes   Plan: Lovenox 85 mg sq q12 hours Check CBC q 3 days.  Jaslene Marsteller Poteet 06/22/2013,2:37 PM

## 2013-06-23 DIAGNOSIS — E876 Hypokalemia: Secondary | ICD-10-CM

## 2013-06-23 DIAGNOSIS — I369 Nonrheumatic tricuspid valve disorder, unspecified: Secondary | ICD-10-CM

## 2013-06-23 DIAGNOSIS — Z8679 Personal history of other diseases of the circulatory system: Secondary | ICD-10-CM

## 2013-06-23 LAB — CBC
HEMATOCRIT: 32.6 % — AB (ref 36.0–46.0)
Hemoglobin: 10.4 g/dL — ABNORMAL LOW (ref 12.0–15.0)
MCH: 26.1 pg (ref 26.0–34.0)
MCHC: 31.9 g/dL (ref 30.0–36.0)
MCV: 81.7 fL (ref 78.0–100.0)
Platelets: 305 10*3/uL (ref 150–400)
RBC: 3.99 MIL/uL (ref 3.87–5.11)
RDW: 16.4 % — AB (ref 11.5–15.5)
WBC: 9.7 10*3/uL (ref 4.0–10.5)

## 2013-06-23 LAB — BASIC METABOLIC PANEL
BUN: 7 mg/dL (ref 6–23)
CO2: 29 mEq/L (ref 19–32)
Calcium: 8.5 mg/dL (ref 8.4–10.5)
Chloride: 100 mEq/L (ref 96–112)
Creatinine, Ser: 0.74 mg/dL (ref 0.50–1.10)
GFR calc Af Amer: >90 mL/min (ref >90)
GFR calc non Af Amer: 85 mL/min — ABNORMAL LOW (ref >90)
Glucose, Bld: 118 mg/dL — ABNORMAL HIGH (ref 70–99)
Potassium: 3.3 mEq/L — ABNORMAL LOW (ref 3.7–5.3)
Sodium: 142 mEq/L (ref 137–147)

## 2013-06-23 LAB — PRO B NATRIURETIC PEPTIDE: Pro B Natriuretic peptide (BNP): 1223 pg/mL — ABNORMAL HIGH (ref 0–125)

## 2013-06-23 MED ORDER — ONDANSETRON HCL 4 MG/2ML IJ SOLN
4.0000 mg | Freq: Four times a day (QID) | INTRAMUSCULAR | Status: DC | PRN
  Administered 2013-06-23 (×2): 4 mg via INTRAVENOUS
  Filled 2013-06-23 (×2): qty 2

## 2013-06-23 MED ORDER — POTASSIUM CHLORIDE CRYS ER 20 MEQ PO TBCR: 20.0000 meq | EXTENDED_RELEASE_TABLET | Freq: Two times a day (BID) | ORAL | Status: DC

## 2013-06-23 MED ORDER — FUROSEMIDE 10 MG/ML IJ SOLN
40.0000 mg | Freq: Two times a day (BID) | INTRAMUSCULAR | Status: DC
  Administered 2013-06-23: 18:00:00 40 mg via INTRAVENOUS
  Filled 2013-06-23 (×3): qty 4

## 2013-06-23 MED ORDER — POTASSIUM CHLORIDE CRYS ER 20 MEQ PO TBCR
20.0000 meq | EXTENDED_RELEASE_TABLET | Freq: Two times a day (BID) | ORAL | Status: DC
  Administered 2013-06-23: 18:00:00 20 meq via ORAL
  Filled 2013-06-23 (×3): qty 1

## 2013-06-23 MED ORDER — POTASSIUM CHLORIDE CRYS ER 20 MEQ PO TBCR
40.0000 meq | EXTENDED_RELEASE_TABLET | Freq: Once | ORAL | Status: AC
  Administered 2013-06-23: 40 meq via ORAL
  Filled 2013-06-23: qty 2

## 2013-06-23 MED ORDER — FUROSEMIDE 10 MG/ML IJ SOLN
40.0000 mg | Freq: Once | INTRAMUSCULAR | Status: AC
  Administered 2013-06-23: 40 mg via INTRAVENOUS
  Filled 2013-06-23: qty 4

## 2013-06-23 MED ORDER — AMIODARONE HCL 200 MG PO TABS
400.0000 mg | ORAL_TABLET | Freq: Two times a day (BID) | ORAL | Status: DC
  Administered 2013-06-23 – 2013-06-25 (×5): 400 mg via ORAL
  Filled 2013-06-23 (×6): qty 2

## 2013-06-23 MED ORDER — DILTIAZEM HCL 100 MG IV SOLR
5.0000 mg/h | INTRAVENOUS | Status: DC
  Administered 2013-06-23: 5 mg/h via INTRAVENOUS
  Filled 2013-06-23: qty 100

## 2013-06-23 MED ORDER — ENOXAPARIN SODIUM 80 MG/0.8ML ~~LOC~~ SOLN
80.0000 mg | Freq: Two times a day (BID) | SUBCUTANEOUS | Status: DC
  Administered 2013-06-23 – 2013-06-24 (×2): 80 mg via SUBCUTANEOUS
  Filled 2013-06-23 (×4): qty 0.8

## 2013-06-23 NOTE — Progress Notes (Signed)
Cardizem drip stopped per order since PO given. Patient was NSR then went into Afib up to 150 she did fell anxious and hot with it. SBP 90 and I increased her oxygen up to 4lnc sats was 88. I notified Dr. Elias Else and order to restart cardizem drip which she then converted back NSR heart 70's maintain SBP 90's I will continue to monitor.

## 2013-06-23 NOTE — Progress Notes (Addendum)
Need to replete potassium [place] Pericardial friction rub with two components heard at [facility] Awaiting echo to be interpreted. Agree with more aggressive diuresis. Not yet ready for D/C. Will start amio so we can eventually stop IV dilt. Amio will be used for 4-6 weeks and stopped. If recurrent A fib off amio, will need permanent therapy or ablation depending on echo findings.  If much effusion, will need to stop anticoagulation given presence of pericarditis.

## 2013-06-23 NOTE — Progress Notes (Signed)
Patient comfortable throughout the day.  Called to room at [facility] for N&V.  Also showed A Fib, rate 160's for approx. 10 minutes, then resolved back to SR with rate in 80's.  Given Zofran, resting comfortably at this time with husband at bedside.

## 2013-06-23 NOTE — Progress Notes (Signed)
  Echocardiogram 2D Echocardiogram has been performed.  Katherine Floyd 06/23/2013, 11:11 AM

## 2013-06-23 NOTE — Progress Notes (Signed)
    Subjective:  Not doing well this am, still SOB, some nausea and vomiting this am. She went back into rapid AF when IV Diltiazem turned off.  Objective:  Vital Signs in the last 24 hours: Temp:  [97.4 F (36.3 C)-98.4 F (36.9 C)] 97.4 F (36.3 C) (02/25 0751) Pulse Rate:  [72-163] 85 (02/25 0751) Resp:  [16-20] 20 (02/25 0751) BP: (57-124)/(36-84) 104/63 mmHg (02/25 0751) SpO2:  [91 %-96 %] 92 % (02/25 0751) Weight:  [178 lb 5.6 oz (80.9 kg)] 178 lb 5.6 oz (80.9 kg) (02/25 0100)  Intake/Output from previous day:  Intake/Output Summary (Last 24 hours) at 06/23/13 0758 Last data filed at 06/23/13 0300  Gross per 24 hour  Intake    900 ml  Output   3402 ml  Net  -2502 ml    Physical Exam: General appearance: alert, cooperative and mild distress Lungs: decreased breath sounds bilat, no wheezing noted Heart: regular rate and rhythm and decreased heart sounds   Rate: 86  Rhythm: normal sinus rhythm and AF with RVR earlier  Lab Results:  Recent Labs  06/22/13 0840 06/23/13 0440  WBC 11.6* 9.7  HGB 10.6* 10.4*  PLT 275 305    Recent Labs  06/21/13 1505 06/23/13 0440  NA 139 142  K 3.3* 3.3*  CL 94* 100  CO2 30 29  GLUCOSE 105* 118*  BUN 13 7  CREATININE 0.80 0.74    Recent Labs  06/22/13 0510 06/22/13 0840  TROPONINI <0.30 <0.30   No results found for this basename: INR,  in the last 72 hours  Imaging: Imaging results have been reviewed  Cardiac Studies:  Assessment/Plan:   Principal Problem:   Atrial fibrillation with RVR Active Problems:   Asthma   Viral URI   Obesity-BMI 34   HTN (hypertension)   Acute diastolic HF (heart failure)    PLAN: Increase Lasix to 40 mg IV BID with K+. She is diuresing but her BNP actually went up and her O2 sats drop when she is on room air.  Recurrent PAF despite increased Diltiazem dose. Hesitant to add low dose beta blocker with her history of asthma- but she is not wheezing now, will discuss with MD.  Echo still not done, will see if it can be done this am.   Kerin Ransom PA-C Beeper [phone] 06/23/2013, 7:58 AM

## 2013-06-23 NOTE — Progress Notes (Signed)
Lovenox per pharmacy  [phone] presents to the ED after a week of fatigue and vomiting. Found to be in afib with RVR - new onset. She was begun on IV heparin. Baseline renal function and CBC wnl.  Had converted to NSR, now back in Afib. Per team, may start Eliquis in the near future.  No cath plan.  Heparin is now switch to lovenox  Goal of Therapy: 4hr LMWH level 0.6-1.2 Monitor platelets by anticoagulation protocol: Yes  Hem/Onc: Hgb 10.4 and Plt 305 K stable Renal: SCr 0.74; K low at 3.3 on 02/25.  Added K supplement   Plan:  1) Reduce to Lovenox 80 mg sq q12 hours since weight is ~80 kg 2) CBC q 3days

## 2013-06-24 DIAGNOSIS — I309 Acute pericarditis, unspecified: Secondary | ICD-10-CM

## 2013-06-24 DIAGNOSIS — E876 Hypokalemia: Secondary | ICD-10-CM

## 2013-06-24 LAB — BASIC METABOLIC PANEL
BUN: 11 mg/dL (ref 6–23)
BUN: 10 mg/dL (ref 6–23)
CALCIUM: 8.9 mg/dL (ref 8.4–10.5)
CHLORIDE: 99 meq/L (ref 96–112)
CO2: 26 mEq/L (ref 19–32)
CO2: 26 meq/L (ref 19–32)
Calcium: 8.7 mg/dL (ref 8.4–10.5)
Chloride: 98 mEq/L (ref 96–112)
Creatinine, Ser: 0.8 mg/dL (ref 0.50–1.10)
Creatinine, Ser: 0.75 mg/dL (ref 0.50–1.10)
GFR calc Af Amer: 85 mL/min — ABNORMAL LOW (ref >90)
GFR calc non Af Amer: 74 mL/min — ABNORMAL LOW (ref >90)
GFR calc non Af Amer: 84 mL/min — ABNORMAL LOW (ref >90)
Glucose, Bld: 101 mg/dL — ABNORMAL HIGH (ref 70–99)
Glucose, Bld: 100 mg/dL — ABNORMAL HIGH (ref 70–99)
Potassium: 3.2 mEq/L — ABNORMAL LOW (ref 3.7–5.3)
Potassium: 3.6 mEq/L — ABNORMAL LOW (ref 3.7–5.3)
SODIUM: 139 meq/L (ref 137–147)
Sodium: 139 mEq/L (ref 137–147)

## 2013-06-24 LAB — CBC
HCT: 31.7 % — ABNORMAL LOW (ref 36.0–46.0)
Hemoglobin: 10.3 g/dL — ABNORMAL LOW (ref 12.0–15.0)
MCH: 26.4 pg (ref 26.0–34.0)
MCHC: 32.5 g/dL (ref 30.0–36.0)
MCV: 81.3 fL (ref 78.0–100.0)
PLATELETS: 305 10*3/uL (ref 150–400)
RBC: 3.9 MIL/uL (ref 3.87–5.11)
RDW: 16.4 % — ABNORMAL HIGH (ref 11.5–15.5)
WBC: 9.6 10*3/uL (ref 4.0–10.5)

## 2013-06-24 LAB — MAGNESIUM: Magnesium: 1.8 mg/dL (ref 1.5–2.5)

## 2013-06-24 MED ORDER — APIXABAN 5 MG PO TABS
5.0000 mg | ORAL_TABLET | Freq: Two times a day (BID) | ORAL | Status: DC
  Administered 2013-06-24 – 2013-06-25 (×2): 5 mg via ORAL
  Filled 2013-06-24 (×3): qty 1

## 2013-06-24 MED ORDER — APIXABAN 5 MG PO TABS
5.0000 mg | ORAL_TABLET | Freq: Two times a day (BID) | ORAL | Status: DC
  Filled 2013-06-24 (×2): qty 1

## 2013-06-24 MED ORDER — COLCHICINE 0.6 MG PO TABS
0.6000 mg | ORAL_TABLET | Freq: Two times a day (BID) | ORAL | Status: DC
  Administered 2013-06-24 – 2013-06-25 (×3): 0.6 mg via ORAL
  Filled 2013-06-24 (×4): qty 1

## 2013-06-24 MED ORDER — FUROSEMIDE 40 MG PO TABS
40.0000 mg | ORAL_TABLET | Freq: Two times a day (BID) | ORAL | Status: DC
  Administered 2013-06-24 – 2013-06-25 (×3): 40 mg via ORAL
  Filled 2013-06-24 (×5): qty 1

## 2013-06-24 MED ORDER — POTASSIUM CHLORIDE CRYS ER 20 MEQ PO TBCR
20.0000 meq | EXTENDED_RELEASE_TABLET | Freq: Two times a day (BID) | ORAL | Status: DC
  Administered 2013-06-25: 10:00:00 20 meq via ORAL
  Filled 2013-06-24 (×3): qty 1

## 2013-06-24 MED ORDER — POTASSIUM CHLORIDE CRYS ER 20 MEQ PO TBCR
40.0000 meq | EXTENDED_RELEASE_TABLET | Freq: Three times a day (TID) | ORAL | Status: AC
  Administered 2013-06-24 – 2013-06-25 (×3): 40 meq via ORAL
  Filled 2013-06-24 (×2): qty 2

## 2013-06-24 MED ORDER — POTASSIUM CHLORIDE CRYS ER 20 MEQ PO TBCR
40.0000 meq | EXTENDED_RELEASE_TABLET | Freq: Once | ORAL | Status: AC
  Administered 2013-06-24: 40 meq via ORAL
  Filled 2013-06-24: qty 2

## 2013-06-24 NOTE — Progress Notes (Addendum)
       Patient Name: Katherine Floyd Date of Encounter: 06/24/2013    SUBJECTIVE: Some nausea and vomiting. Had brief recurrent AF last evening.  TELEMETRY:  NSR but some brief A fib last PM Filed Vitals:   06/24/13 0532 06/24/13 0600 06/24/13 0630 06/24/13 0749  BP: 110/69 113/68 105/66 102/58  Pulse: 70 145 129 77  Temp: 98.6 F (37 C)   97.7 F (36.5 C)  TempSrc: Oral   Oral  Resp: 18   18  Height:      Weight:      SpO2: 94% 94% 95% 93%    Intake/Output Summary (Last 24 hours) at 06/24/13 0823 Last data filed at 06/23/13 1800  Gross per 24 hour  Intake    140 ml  Output    800 ml  Net   -660 ml   Net I/O since admission: -5158 cc  LABS: Basic Metabolic Panel:  Recent Labs  06/21/13 1506 06/23/13 0440 06/24/13 0322  NA  --  142 139  K  --  3.3* 3.2*  CL  --  100 98  CO2  --  29 26  GLUCOSE  --  118* 101*  BUN  --  7 11  CREATININE  --  0.74 0.80  CALCIUM  --  8.5 8.7  MG 2.2  --  1.8   CBC:  Recent Labs  06/23/13 0440 06/24/13 0322  WBC 9.7 9.6  HGB 10.4* 10.3*  HCT 32.6* 31.7*  MCV 81.7 81.3  PLT 305 305   Cardiac Enzymes:  Recent Labs  06/21/13 2124 06/22/13 0510 06/22/13 0840  TROPONINI <0.30 <0.30 <0.30   ECHO 06/23/13: Study Conclusions  - Left ventricle: The cavity size was normal. Wall thickness was increased in a pattern of mild LVH. Systolic function was normal. The estimated ejection fraction was in the range of 60% to 65%. Wall motion was normal; there were no regional wall motion abnormalities. - Aortic valve: Trivial regurgitation. - Tricuspid valve: Mild-moderate regurgitation. - Pericardium, extracardiac: A trivial pericardial effusion was identified.   Radiology/Studies:   IMPRESSION: 06/21/13 Since the earlier study bibasilar atelectasis and small effusions  have developed. This may reflect mild CHF given the appearance of  the cardiac silhouette and pulmonary vascularity. There is no  classic alveolar  pneumonia.    Physical Exam: Blood pressure 102/58, pulse 77, temperature 97.7 F (36.5 C), temperature source Oral, resp. rate 18, height 5\' 1"  (1.549 m), weight 177 lb 7.5 oz (80.5 kg), SpO2 93.00%. Weight change: -14.1 oz (-0.4 kg)   Faint rub  ASSESSMENT:  1. Acute pericarditis, possibly viral. No significant effusion 2.PAF, current NSR 3. Acute on chronic diastolic HF, improved with diuresis 4. Anticoagulation 5. Hypokalemia  Plan:  1. D/C IV diltiazem 2. Continue Amio 3. Start Colchicine 4. Ambuate 5. Change diuretic to oral. 4. Replete potassium  Signed, Sinclair Grooms 06/24/2013, 8:23 AM

## 2013-06-24 NOTE — Progress Notes (Signed)
Patient wean off oxygen on room air sats 88-92% including while ambulating in hall. Cardizem drip stopped this morning and patient has tolerated it well staying so far in NSR without complications. I will continue to monitor patient.

## 2013-06-24 NOTE — Care Management Note (Signed)
    Page 1 of 1   06/24/2013     11:21:59 AM   CARE MANAGEMENT NOTE 06/24/2013  Patient:  Katherine Floyd, Katherine Floyd   Account Number:  [id number]  Date Initiated:  06/24/2013  Documentation initiated by:  [facility]  Subjective/Objective Assessment:   68 yo female with Floyd history of HTN, tobacco abuse(10-15PY, quit in 1990), GERD, asthma. She presents from her PCP with new onset afib.  //Home with spouse     Action/Plan:   Converted to NSR with PACs with cardizem.  ON IV heparin. CHADSVAC = 3.  Change to PO cardizem 120mg .  Change heparin to NOAC.  Cycle troponin.  Echo today//Benefits check for Eliquis   Anticipated DC Date:  06/25/2013   Anticipated DC Plan:  [place]  CM consult      Choice offered to / List presented to:  C-1 Patient           Status of service:   Medicare Important Message given?   (If response is "NO", the following Medicare IM given date fields will be blank) Date Medicare IM given:   Date Additional Medicare IM given:    Discharge Disposition:    Per UR Regulation:    If discussed at Long Length of Stay Meetings, dates discussed:    Comments:  06/24/13 [place], RN, BSN, [employer] [phone] Spoke with pt at bedside regarding benefits check for Eliquis.  Pt has brochure with 30 day free card and refill assistance card intact.  Pt utilizes [facility] on [address] in [place], [place] for prescription needs.  NCM called pharmacy to confirm availability of medication. Information relayed to pt.  Pt verbalizes importance of filling medication upon discharge.

## 2013-06-25 LAB — BASIC METABOLIC PANEL
BUN: 12 mg/dL (ref 6–23)
CO2: 26 mEq/L (ref 19–32)
Calcium: 8.8 mg/dL (ref 8.4–10.5)
Chloride: 99 mEq/L (ref 96–112)
Creatinine, Ser: 0.85 mg/dL (ref 0.50–1.10)
GFR calc Af Amer: 79 mL/min — ABNORMAL LOW (ref >90)
GFR calc non Af Amer: 68 mL/min — ABNORMAL LOW (ref >90)
Glucose, Bld: 92 mg/dL (ref 70–99)
Potassium: 4 mEq/L (ref 3.7–5.3)
Sodium: 139 mEq/L (ref 137–147)

## 2013-06-25 LAB — CBC
HCT: 31.6 % — ABNORMAL LOW (ref 36.0–46.0)
Hemoglobin: 10.4 g/dL — ABNORMAL LOW (ref 12.0–15.0)
MCH: 26.4 pg (ref 26.0–34.0)
MCHC: 32.9 g/dL (ref 30.0–36.0)
MCV: 80.2 fL (ref 78.0–100.0)
PLATELETS: 313 10*3/uL (ref 150–400)
RBC: 3.94 MIL/uL (ref 3.87–5.11)
RDW: 16.2 % — ABNORMAL HIGH (ref 11.5–15.5)
WBC: 10.9 10*3/uL — AB (ref 4.0–10.5)

## 2013-06-25 MED ORDER — COLCHICINE 0.6 MG PO TABS: 0.6000 mg | ORAL_TABLET | Freq: Two times a day (BID) | ORAL | Status: DC

## 2013-06-25 MED ORDER — APIXABAN 5 MG PO TABS: 5.0000 mg | ORAL_TABLET | Freq: Two times a day (BID) | ORAL | Status: DC

## 2013-06-25 MED ORDER — DILTIAZEM HCL ER COATED BEADS 120 MG PO CP24: 120.0000 mg | ORAL_CAPSULE | Freq: Two times a day (BID) | ORAL | Status: DC

## 2013-06-25 MED ORDER — AMIODARONE HCL 200 MG PO TABS: 200.0000 mg | ORAL_TABLET | Freq: Two times a day (BID) | ORAL | Status: DC

## 2013-06-25 MED ORDER — FUROSEMIDE 40 MG PO TABS: 40.0000 mg | ORAL_TABLET | Freq: Two times a day (BID) | ORAL | Status: DC

## 2013-06-25 MED ORDER — POTASSIUM CHLORIDE CRYS ER 20 MEQ PO TBCR: 20.0000 meq | EXTENDED_RELEASE_TABLET | Freq: Every day | ORAL | Status: DC

## 2013-06-25 NOTE — Progress Notes (Signed)
Subjective: Feels a lot better.  No orthopnea or SOB.  Ambulated in the hall yesterday without difficulties.  Objective: Vital signs in last 24 hours: Temp:  [97.6 F (36.4 C)-98.6 F (37 C)] 97.7 F (36.5 C) (02/27 0748) Pulse Rate:  [76-90] 78 (02/27 0748) Resp:  [18] 18 (02/27 0748) BP: (99-131)/(55-97) 126/74 mmHg (02/27 0748) SpO2:  [88 %-95 %] 92 % (02/27 0748) Weight:  [176 lb 9.4 oz (80.1 kg)] 176 lb 9.4 oz (80.1 kg) (02/27 0421) Last BM Date: 06/24/13  Intake/Output from previous day: 02/26 0701 - 02/27 0700 In: 420 [P.O.:420] Out: -  Intake/Output this shift:    Medications Current Facility-Administered Medications  Medication Dose Route Frequency Provider Last Rate Last Dose  . acetaminophen (TYLENOL) tablet 650 mg  650 mg Oral Q6H PRN Rogelia Mire, NP   650 mg at 06/23/13 1821  . albuterol (PROVENTIL) (2.5 MG/3ML) 0.083% nebulizer solution 2.5 mg  2.5 mg Nebulization Q4H PRN Tarri Fuller, PA-C      . ALPRAZolam Duanne Moron) tablet 0.25-0.5 mg  0.25-0.5 mg Oral TID PRN Tarri Fuller, PA-C   0.5 mg at 06/24/13 2217  . alum & mag hydroxide-simeth (MAALOX/MYLANTA) 200-200-20 MG/5ML suspension 30 mL  30 mL Oral Q2H PRN Larey Dresser, MD   30 mL at 06/23/13 2020  . amiodarone (PACERONE) tablet 400 mg  400 mg Oral BID Belva Crome III, MD   400 mg at 06/24/13 2217  . apixaban (ELIQUIS) tablet 5 mg  5 mg Oral BID Larey Dresser, MD   5 mg at 06/24/13 1810  . colchicine tablet 0.6 mg  0.6 mg Oral BID Belva Crome III, MD   0.6 mg at 06/24/13 2217  . diltiazem (CARDIZEM CD) 24 hr capsule 120 mg  120 mg Oral BID Belva Crome III, MD   120 mg at 06/24/13 2033  . docusate sodium (COLACE) capsule 100 mg  100 mg Oral QHS Tarri Fuller, PA-C   100 mg at 06/22/13 2216  . furosemide (LASIX) tablet 40 mg  40 mg Oral BID Belva Crome III, MD   40 mg at 06/24/13 1809  . mometasone-formoterol (DULERA) 100-5 MCG/ACT inhaler 2 puff  2 puff Inhalation BID Tarri Fuller, PA-C   2 puff  at 06/24/13 2241  . montelukast (SINGULAIR) tablet 10 mg  10 mg Oral QHS Tarri Fuller, PA-C   10 mg at 06/24/13 2217  . ondansetron (ZOFRAN) injection 4 mg  4 mg Intravenous Q6H PRN Brittainy Simmons, PA-C   4 mg at 06/23/13 1834  . pantoprazole (PROTONIX) EC tablet 40 mg  40 mg Oral Daily Tarri Fuller, PA-C   40 mg at 06/24/13 0919  . polyvinyl alcohol (LIQUIFILM TEARS) 1.4 % ophthalmic solution 1 drop  1 drop Both Eyes TID PRN Tarri Fuller, PA-C      . potassium chloride SA (K-DUR,KLOR-CON) CR tablet 20 mEq  20 mEq Oral BID Sinclair Grooms, MD        PE: General appearance: alert, cooperative and no distress Lungs: clear to auscultation bilaterally Heart: regular rate and rhythm, S1, S2 normal, no murmur, click, rub or gallop Extremities: No LEE Pulses: 2+ and symmetric Skin: Warm and dry Neurologic: Grossly normal  Lab Results:   Recent Labs  06/23/13 0440 06/24/13 0322 06/25/13 0340  WBC 9.7 9.6 10.9*  HGB 10.4* 10.3* 10.4*  HCT 32.6* 31.7* 31.6*  PLT 305 305 313   BMET  Recent Labs  06/24/13  4656 06/24/13 0950 06/25/13 0340  NA 139 139 139  K 3.2* 3.6* 4.0  CL 98 99 99  CO2 26 26 26   GLUCOSE 101* 100* 92  BUN 11 10 12   CREATININE 0.80 0.75 0.85  CALCIUM 8.7 8.9 8.8    Assessment/Plan  Principal Problem:   Atrial fibrillation with RVR Active Problems:   Obesity-BMI 34   HTN (hypertension)   Asthma   Viral URI   Acute diastolic HF (heart failure)   Acute pericarditis, unspecified   Hypokalemia  Plan:   Admitted with Afib RVR.  Now on Amiodarone, 400mg  bid, Diltiazem 120mg  BID Eliquis and maintaining NSR for the last 24 hours.  Net fluids: +0.42L/-4.8L(no UOP charted for yesterday).  EF 60-65%, mild to mod TR.  Lasix 40mg  BID.   Weight  Decreased from 181 to 176#. BP controlled this morning.  No orthopnea.  Ambulating without difficulty.  I think we can decrease lasix to 40mg  daily and maybe to PRN.  Colchicine for pericarditis.   Decrease amio to once  daily in 7 days.  She would like to be seen at [facility].   Follow up with me in 1-2 weeks.  Ambulate this morning and if stable, DC home.  We discussed dietary modifications and exercise.    LOS: 4 days    Katherine Floyd 06/25/2013 8:14 AM

## 2013-06-25 NOTE — Progress Notes (Signed)
Patient [place] home.  Instructed to take OTC of her choice for loose stool and call MD if not effective in 2 days.

## 2013-06-25 NOTE — Progress Notes (Signed)
She is ready for discharge. Needs f/u 1 week with BMET and clinical eval for pericardial effusion. Should get limited echo if concern on f/u.

## 2013-06-25 NOTE — Discharge Summary (Signed)
Plan as outlined. Discussed with the patient and f/u arranged.

## 2013-06-25 NOTE — Discharge Summary (Signed)
Physician Discharge Summary     Patient ID: Katherine Floyd MRN: [id number] DOB/AGE: 69/05/1945 69 y.o. Cardiologist:  Smith([place])  Admit date: 06/21/2013 Discharge date: 06/25/2013  Admission Diagnoses:  Afib RVR   Discharge Diagnoses:  Principal Problem:   Atrial fibrillation with RVR Active Problems:   Acute pericarditis, unspecified   Obesity-BMI 34   HTN (hypertension)   Asthma   Viral URI   Acute diastolic HF (heart failure)   Hypokalemia   Discharged Condition: stable  Hospital Course:   The patient is a 69 yo female with a history of HTN, tobacco abuse(10-15PY, quit in 1990), GERD, asthma. Both parents had CAD and stents. She presents from her PCP with new onset afib. She was shoveling snow last Tuesday. After going back inside she felt very cold and chilled. She subsequently developed pain in her left collar bone which radiated to her neck and has persisted but improved with a heating pad. The area was also tender to touch. On Saturday she developed N and V. She went to see Dr. Deatra Ina who did an EKG which showed a fib RVR. She was then directed to ER. The patient currently denies fever, chest pain, orthopnea, dizziness, PND, cough, congestion, abdominal pain, hematochezia, melena, lower extremity edema, claudication. She did have some shortness of breath while walking to the bathroom in the ER and O2 sat was in the upper 80's  She was admitted with Afib RVR.  Onset unknown.  She converted to NSR after being started on IV diltiazem.  This was changed to PO. She was initially started on IV heparin but as changed to Eliquis.  CHADS VASC = 3.  She had recurrent Afib so Amiodarone was added and then decreased to 200mg  BID.  She again convert to NSR and maintained to discharge.   She ruled out for MI.  Will plan outpatient nuclear stress test.  2D echo: EF 60-65%, normal wall motion, trivial AR, mild to moderate TR trivial pericardial effusion.  Potassium was replaced.  Colchicine was  added for suspected pericarditis.  Will need to watch for pericardial effusion with eliquis.  ProBNP was elevated at 1032.0.  Lasix was added and weight decreased from  181# to 176.  The patient was seen by Dr. Tamala Julian who felt she was stable for DC home.   Followup appt needs:  Myoview, decrease lasix?  Consults: None  Significant Diagnostic Studies:  2D echo Study Conclusions  - Left ventricle: The cavity size was normal. Wall thickness was increased in a pattern of mild LVH. Systolic function was normal. The estimated ejection fraction was in the range of 60% to 65%. Wall motion was normal; there were no regional wall motion abnormalities. - Aortic valve: Trivial regurgitation. - Tricuspid valve: Mild-moderate regurgitation. - Pericardium, extracardiac: A trivial pericardial effusion was identified.  EXAM: CHEST 2 VIEW  COMPARISON: DG CHEST 2V dated 01/25/2013  FINDINGS: The lungs are adequately inflated. The interstitial markings are increased bilaterally. There is subsegmental atelectasis at the lung bases. A small amount of pleural fluid blunts the costophrenic angles. The cardiac silhouette is top-normal to mildly enlarged. Prominence of the central pulmonary vascularity is present.  IMPRESSION: Since the earlier study bibasilar atelectasis and small effusions have developed. This may reflect mild CHF given the appearance of the cardiac silhouette and pulmonary vascularity. There is no classic alveolar pneumonia.   Treatments: See above  Discharge Exam: Blood pressure 126/74, pulse 78, temperature 97.7 F (36.5 C), temperature source Oral, resp. rate 18, height  5\' 1"  (1.549 m), weight 176 lb 9.4 oz (80.1 kg), SpO2 97.00%.   Disposition: 01-Home or Self Care     Medication List    STOP taking these medications       aspirin EC 81 MG tablet     Black Cohosh 540 MG Caps      TAKE these medications       ADVAIR DISKUS 100-50 MCG/DOSE Aepb  Generic drug:   Fluticasone-Salmeterol  Inhale 1 puff into the lungs 2 (two) times daily.     ALPRAZolam 0.5 MG tablet  Commonly known as:  XANAX  - Take 0.25-0.5 mg by mouth 3 (three) times daily as needed. Anxiety  -      amiodarone 200 MG tablet  Commonly known as:  PACERONE  Take 1 tablet (200 mg total) by mouth 2 (two) times daily.     apixaban 5 MG Tabs tablet  Commonly known as:  ELIQUIS  Take 1 tablet (5 mg total) by mouth 2 (two) times daily.     CALCIUM-CARB 600 + D PO  Take 2 tablets by mouth daily.     carboxymethylcellulose 0.5 % Soln  Commonly known as:  REFRESH PLUS  - Place 1 drop into both eyes 3 (three) times daily as needed. Dry eyes  -   -      cetirizine 10 MG tablet  Commonly known as:  ZYRTEC  Take 10 mg by mouth daily.     colchicine 0.6 MG tablet  Take 1 tablet (0.6 mg total) by mouth 2 (two) times daily.     diltiazem 120 MG 24 hr capsule  Commonly known as:  CARDIZEM CD  Take 1 capsule (120 mg total) by mouth 2 (two) times daily.     docusate sodium 100 MG capsule  Commonly known as:  COLACE  Take 100 mg by mouth at bedtime.     furosemide 40 MG tablet  Commonly known as:  LASIX  Take 1 tablet (40 mg total) by mouth 2 (two) times daily.     Grape Seed 100 MG Caps  Take 100 mg by mouth daily.     hydrochlorothiazide 25 MG tablet  Commonly known as:  HYDRODIURIL  - Take 12.5-25 mg by mouth daily as needed. Fluid  -      montelukast 10 MG tablet  Commonly known as:  SINGULAIR  Take 10 mg by mouth at bedtime.     omeprazole 40 MG capsule  Commonly known as:  PRILOSEC  Take 40 mg by mouth daily.     potassium chloride SA 20 MEQ tablet  Commonly known as:  K-DUR,KLOR-CON  Take 1 tablet (20 mEq total) by mouth daily.     PROAIR HFA 108 (90 BASE) MCG/ACT inhaler  Generic drug:  albuterol  - Inhale 2 puffs into the lungs every 4 (four) hours as needed. Wheezing  -      Red Yeast Rice 600 MG Caps  Take 1,200 mg by mouth daily.     sodium  chloride 0.65 % nasal spray  Commonly known as:  OCEAN  - Place 1 spray into the nose 2 (two) times daily as needed. Allergies  -      vitamin E 400 UNIT capsule  Take 400 Units by mouth daily.       Follow-up Information   Follow up with Sinclair Grooms, MD.   Specialty:  Cardiology   Contact information:   [address]  [place] [address] [phone]       Signed: Tarri Fuller 06/25/2013, 10:42 AM

## 2013-06-28 ENCOUNTER — Telehealth: Payer: Self-pay | Admitting: *Deleted

## 2013-06-28 NOTE — Telephone Encounter (Signed)
Just an FYI. Pharmacy called and wanted to make Dr Tamala Julian aware of possible interactions between colchicine and cardizem.  Thanks, MI

## 2013-06-30 ENCOUNTER — Telehealth: Payer: Self-pay | Admitting: *Deleted

## 2013-06-30 NOTE — Telephone Encounter (Signed)
returned call to pt pharmacy.Dr.Smith spoke with the phamcist at pt [facility].because of possible interaction with diltiazem and colchicine.pt will not start colchicine and pt  has picked up diltiazem.  lmom for pt to return call.pt is has appt with Dr.Nishan for 07/21/13.Dr.Smith rqst to have pt switched to his schedule.pt was seen by Dr.Smith in the hospital.

## 2013-06-30 NOTE — Telephone Encounter (Signed)
Patient states that the pharmacy will not refill the colchicine or cardizem until we call them and inform them that Dr Tamala Julian is aware she is on both and that there is a possible interaction between them. Please advise. Thanks, MI

## 2013-07-01 NOTE — Telephone Encounter (Signed)
pt appt made per Dr.Smith...07/21/13@12pm .pt adv do not start colchicine due to interaction with diltiazem.pt did start diltiazem.pt sts she is doing well.pt adv to call the office if symptoms develope.pt agreeable with plan and verbalzied understanding.

## 2013-07-03 NOTE — Telephone Encounter (Signed)
We stopped colchicine

## 2013-07-10 ENCOUNTER — Encounter: Payer: Self-pay | Admitting: Interventional Cardiology

## 2013-07-20 ENCOUNTER — Encounter: Payer: Medicare Other | Admitting: Cardiovascular Disease

## 2013-07-21 ENCOUNTER — Ambulatory Visit (INDEPENDENT_AMBULATORY_CARE_PROVIDER_SITE_OTHER): Payer: Medicare PPO | Admitting: Interventional Cardiology

## 2013-07-21 ENCOUNTER — Encounter: Payer: Self-pay | Admitting: Interventional Cardiology

## 2013-07-21 ENCOUNTER — Telehealth: Payer: Self-pay

## 2013-07-21 VITALS — BP 110/62 | HR 60 | Ht 61.25 in | Wt 168.4 lb

## 2013-07-21 DIAGNOSIS — I4891 Unspecified atrial fibrillation: Secondary | ICD-10-CM

## 2013-07-21 DIAGNOSIS — I5032 Chronic diastolic (congestive) heart failure: Secondary | ICD-10-CM

## 2013-07-21 DIAGNOSIS — I5031 Acute diastolic (congestive) heart failure: Secondary | ICD-10-CM

## 2013-07-21 DIAGNOSIS — I309 Acute pericarditis, unspecified: Secondary | ICD-10-CM

## 2013-07-21 DIAGNOSIS — Z7901 Long term (current) use of anticoagulants: Secondary | ICD-10-CM

## 2013-07-21 DIAGNOSIS — I10 Essential (primary) hypertension: Secondary | ICD-10-CM

## 2013-07-21 LAB — BASIC METABOLIC PANEL
BUN: 12 mg/dL (ref 6–23)
CHLORIDE: 90 meq/L — AB (ref 96–112)
CO2: 32 meq/L (ref 19–32)
Calcium: 9.1 mg/dL (ref 8.4–10.5)
Creatinine, Ser: 1.1 mg/dL (ref 0.4–1.2)
GFR: 50.71 mL/min — ABNORMAL LOW (ref >60.00)
GLUCOSE: 93 mg/dL (ref 70–99)
Potassium: 2.4 mEq/L — CL (ref 3.5–5.1)
SODIUM: 133 meq/L — AB (ref 135–145)

## 2013-07-21 MED ORDER — FUROSEMIDE 40 MG PO TABS: 40.0000 mg | ORAL_TABLET | Freq: Every day | ORAL | Status: DC

## 2013-07-21 MED ORDER — AMIODARONE HCL 200 MG PO TABS: 200.0000 mg | ORAL_TABLET | Freq: Every day | ORAL | Status: DC

## 2013-07-21 NOTE — Progress Notes (Signed)
Patient ID: Katherine Floyd, female   DOB: [birth year], 68 y.o.   MRN: [id number]    [address], [address], [place]  [address] Phone: [phone] Fax:  [phone]  Date:  07/21/2013   ID:  Katherine Floyd, DOB 15-May-1945, MRN [id number]  PCP:  Tula Nakayama   ASSESSMENT:  1. Atrial fibrillation, resolved 2. Pericarditis, clinically resolved 3. Acute diastolic heart failure, clinically resolved 4. Suspect volume contraction/dehydration 5. Hypertension  PLAN:  1.Decrease furosemide to 40 mg daily and discontinue hydrochlorothiazide 2. Decrease amiodarone to 200 mg per day 3. Basic metabolic panel 4. Followup in 6 weeks   SUBJECTIVE: Katherine Floyd is a 67 y.o. female feels much better, no racing heart, but has persistent weakness and fatigue. She denies orthopnea and PND.   Wt Readings from Last 3 Encounters:  07/21/13 168 lb 6.4 oz (76.386 kg)  06/25/13 176 lb 9.4 oz (80.1 kg)  11/18/11 166 lb 3.2 oz (75.388 kg)     Past Medical History  Diagnosis Date  . Asthma   . GERD (gastroesophageal reflux disease)   . Osteoporosis   . Urinary tract infection 08/29/11    Cipro per PCP- states is resolving  . Anxiety   . Kidney stone   . Polyp of colon, adenomatous     recurrent  . Arthritis     with fracture right great toe 08/30/11 from "stepping wrong"  . Hypertension   . Dysrhythmia 06/21/2013    NEW ONSET ATRIAL FIBRILATION/RVR    Current Outpatient Prescriptions  Medication Sig Dispense Refill  . ADVAIR DISKUS 100-50 MCG/DOSE AEPB Inhale 1 puff into the lungs 2 (two) times daily.       Marland Kitchen ALPRAZolam (XANAX) 0.5 MG tablet Take 0.25-0.5 mg by mouth 3 (three) times daily as needed. Anxiety       . amiodarone (PACERONE) 200 MG tablet Take 1 tablet (200 mg total) by mouth 2 (two) times daily.  60 tablet  6  . apixaban (ELIQUIS) 5 MG TABS tablet Take 1 tablet (5 mg total) by mouth 2 (two) times daily.  60 tablet  11  . Calcium Carbonate-Vitamin D  (CALCIUM-CARB 600 + D PO) Take 2 tablets by mouth daily.      . carboxymethylcellulose (REFRESH PLUS) 0.5 % SOLN Place 1 drop into both eyes 3 (three) times daily as needed. Dry eyes        . cetirizine (ZYRTEC) 10 MG tablet Take 10 mg by mouth daily.      Marland Kitchen diltiazem (CARDIZEM CD) 120 MG 24 hr capsule Take 1 capsule (120 mg total) by mouth 2 (two) times daily.  30 capsule  6  . docusate sodium (COLACE) 100 MG capsule Take 100 mg by mouth at bedtime.       . furosemide (LASIX) 40 MG tablet Take 1 tablet (40 mg total) by mouth 2 (two) times daily.  30 tablet  6  . hydrochlorothiazide (HYDRODIURIL) 25 MG tablet Take 12.5-25 mg by mouth daily as needed. Fluid       . montelukast (SINGULAIR) 10 MG tablet Take 10 mg by mouth at bedtime.       Marland Kitchen omeprazole (PRILOSEC) 40 MG capsule Take 40 mg by mouth daily.      . potassium chloride SA (K-DUR,KLOR-CON) 20 MEQ tablet Take 1 tablet (20 mEq total) by mouth daily.  30 tablet  6  . PROAIR HFA 108 (90 BASE) MCG/ACT inhaler Inhale 2 puffs into the lungs every  4 (four) hours as needed. Wheezing       . Red Yeast Rice 600 MG CAPS Take 1,200 mg by mouth daily.       . sodium chloride (OCEAN) 0.65 % nasal spray Place 1 spray into the nose 2 (two) times daily as needed. Allergies       . Grape Seed 100 MG CAPS Take 100 mg by mouth daily.      . vitamin E 400 UNIT capsule Take 400 Units by mouth daily.       No current facility-administered medications for this visit.    Allergies:    Allergies  Allergen Reactions  . Sulfur Itching  . Ampicillin Itching and Rash    Social History:  The patient  reports that she quit smoking about 24 years ago. She has never used smokeless tobacco. She reports that she does not drink alcohol or use illicit drugs.   ROS:  Please see the history of present illness.   Denies chest pain. No pleuritic discomfort. Denies orthopnea. No pounding. Mild orthostatic dizziness. No edema.   All other systems reviewed and negative.     OBJECTIVE: VS:  BP 110/62  Pulse 60  Ht 5' 1.25" (1.556 m)  Wt 168 lb 6.4 oz (76.386 kg)  BMI 31.55 kg/m2 Well nourished, well developed, in no acute distress, appears happy and compensated. Decreased skin turgor HEENT: normal Neck: JVD flat. Carotid bruit absent  Cardiac:  normal S1, S2; RRR; no murmur Lungs:  clear to auscultation bilaterally, no wheezing, rhonchi or rales Abd: soft, nontender, no hepatomegaly Ext: Edema absent. Pulses 2+ and symmetric Skin: warm and dry Neuro:  CNs 2-12 intact, no focal abnormalities noted  EKG:  Not repeated       Signed, Illene Labrador III, MD 07/21/2013 12:47 PM

## 2013-07-21 NOTE — Patient Instructions (Signed)
Your physician has recommended you make the following change in your medication:  1)STOP Hctz 2)Decrease Amiodarone to 200mg  daily 3)Decrease Furosemide to 40mg  daily  Lab Today: Bmet  You have a follow up appt scheduled for 09/06/13 @9 :15 am

## 2013-07-21 NOTE — Telephone Encounter (Signed)
received critical lab call pt bmet results.pt K+ 2.4. pt given Dr.Smith instructions take 52meq K+ bid for 2 days, then resume normal dose of 20 meq daily. pt will come in for repeat bmet on 07/28/13. lab appt made.pt agreeable with plan and verbalized understanding.

## 2013-07-21 NOTE — Addendum Note (Signed)
Addended by: Eulis Foster on: 07/21/2013 01:31 PM   Modules accepted: Orders

## 2013-07-28 ENCOUNTER — Other Ambulatory Visit (INDEPENDENT_AMBULATORY_CARE_PROVIDER_SITE_OTHER): Payer: Medicare PPO

## 2013-07-28 DIAGNOSIS — I5031 Acute diastolic (congestive) heart failure: Secondary | ICD-10-CM

## 2013-07-28 LAB — BASIC METABOLIC PANEL
BUN: 9 mg/dL (ref 6–23)
CHLORIDE: 101 meq/L (ref 96–112)
CO2: 26 meq/L (ref 19–32)
CREATININE: 1.1 mg/dL (ref 0.4–1.2)
Calcium: 9.2 mg/dL (ref 8.4–10.5)
GFR: 53.43 mL/min — ABNORMAL LOW (ref >60.00)
GLUCOSE: 95 mg/dL (ref 70–99)
POTASSIUM: 4.1 meq/L (ref 3.5–5.1)
Sodium: 136 mEq/L (ref 135–145)

## 2013-08-02 ENCOUNTER — Telehealth: Payer: Self-pay

## 2013-08-02 NOTE — Telephone Encounter (Signed)
Message copied by Lamar Laundry on Mon Aug 02, 2013  8:09 AM ------      Message from: Daneen Schick      Created: Thu Jul 29, 2013  9:27 AM       Now normal ------

## 2013-08-02 NOTE — Telephone Encounter (Signed)
Message copied by Lamar Laundry on Mon Aug 02, 2013  8:08 AM ------      Message from: Daneen Schick      Created: Thu Jul 29, 2013  9:27 AM       Now normal ------

## 2013-08-02 NOTE — Telephone Encounter (Signed)
pt given lab results.Now normal.pt verbalized understanding.

## 2013-09-06 ENCOUNTER — Encounter: Payer: Self-pay | Admitting: Interventional Cardiology

## 2013-09-06 ENCOUNTER — Ambulatory Visit (INDEPENDENT_AMBULATORY_CARE_PROVIDER_SITE_OTHER): Payer: Medicare PPO | Admitting: Interventional Cardiology

## 2013-09-06 VITALS — BP 114/70 | HR 62 | Ht 61.25 in | Wt 169.8 lb

## 2013-09-06 DIAGNOSIS — Z5181 Encounter for therapeutic drug level monitoring: Secondary | ICD-10-CM

## 2013-09-06 DIAGNOSIS — Z7901 Long term (current) use of anticoagulants: Secondary | ICD-10-CM

## 2013-09-06 DIAGNOSIS — Z79899 Other long term (current) drug therapy: Secondary | ICD-10-CM

## 2013-09-06 DIAGNOSIS — I4891 Unspecified atrial fibrillation: Secondary | ICD-10-CM

## 2013-09-06 DIAGNOSIS — I5032 Chronic diastolic (congestive) heart failure: Secondary | ICD-10-CM

## 2013-09-06 DIAGNOSIS — I10 Essential (primary) hypertension: Secondary | ICD-10-CM

## 2013-09-06 NOTE — Progress Notes (Signed)
Patient ID: Katherine Floyd, female   DOB: 02/27/1946, 68 y.o.   MRN: [id number]    [address] [place], [place]  [address] Phone: [phone] Fax:  [phone]  Date:  09/06/2013   ID:  Katherine Floyd, DOB 1945/08/02, MRN [id number]  PCP:  Tula Nakayama   ASSESSMENT:  1. Atrial fibrillation with rapid ventricular response, controlled to normal sinus rhythm on amiodarone post cardioversion 2. Fatigue 3. Right lower extremity basal cell carcinoma 4. Chronic anticoagulation therapy 5. Amiodarone therapy  PLAN:  1. Patient is cleared for any upcoming excision of the basal cell cancer on her lower leg. Her anticoagulation can be discontinued 48-72 hours prior and resumed when felt safe 2. Discontinue diltiazem 3. Clinical followup in 3 months at which time we will Consider the possibility of discontinuing amiodarone 4. Decreased fluid intake   SUBJECTIVE: Katherine Floyd is a 67 y.o. female who complains of feeling dizzy, fatigue, and weak. She was extremely dehydrated the last time I saw her. Hydrochlorothiazide was discontinued. She measures her blood pressure daily and her recording suggests blood pressures are usually around 100 mm mercury systolic. She has a large basal cell carcinoma on her right calf. This needs to be resected. She's had no bleeding complications onEliquis. She denies orthopnea, PND, chest pain, and syncope.   Wt Readings from Last 3 Encounters:  09/06/13 169 lb 12.8 oz (77.021 kg)  07/21/13 168 lb 6.4 oz (76.386 kg)  06/25/13 176 lb 9.4 oz (80.1 kg)     Past Medical History  Diagnosis Date  . Asthma   . GERD (gastroesophageal reflux disease)   . Osteoporosis   . Urinary tract infection 08/29/11    Cipro per PCP- states is resolving  . Anxiety   . Kidney stone   . Polyp of colon, adenomatous     recurrent  . Arthritis     with fracture right great toe 08/30/11 from "stepping wrong"  . Hypertension   . Dysrhythmia 06/21/2013      NEW ONSET ATRIAL FIBRILATION/RVR    Current Outpatient Prescriptions  Medication Sig Dispense Refill  . ADVAIR DISKUS 100-50 MCG/DOSE AEPB Inhale 1 puff into the lungs 2 (two) times daily.       Marland Kitchen ALPRAZolam (XANAX) 0.5 MG tablet Take 0.25-0.5 mg by mouth 3 (three) times daily as needed. Anxiety       . amiodarone (PACERONE) 200 MG tablet Take 1 tablet (200 mg total) by mouth daily.      Marland Kitchen apixaban (ELIQUIS) 5 MG TABS tablet Take 1 tablet (5 mg total) by mouth 2 (two) times daily.  60 tablet  11  . Calcium Carbonate-Vitamin D (CALCIUM-CARB 600 + D PO) Take 2 tablets by mouth daily.      . carboxymethylcellulose (REFRESH PLUS) 0.5 % SOLN Place 1 drop into both eyes 3 (three) times daily as needed. Dry eyes        . cetirizine (ZYRTEC) 10 MG tablet Take 10 mg by mouth daily.      Marland Kitchen diltiazem (CARDIZEM CD) 120 MG 24 hr capsule Take 1 capsule (120 mg total) by mouth 2 (two) times daily.  30 capsule  6  . docusate sodium (COLACE) 100 MG capsule Take 100 mg by mouth at bedtime.       . furosemide (LASIX) 40 MG tablet Take 1 tablet (40 mg total) by mouth daily.      . montelukast (SINGULAIR) 10 MG tablet Take 10 mg by  mouth at bedtime.       Marland Kitchen omeprazole (PRILOSEC) 40 MG capsule Take 40 mg by mouth daily.      Marland Kitchen POTASSIUM CHLORIDE PO Take 40 mg by mouth daily.      Marland Kitchen PROAIR HFA 108 (90 BASE) MCG/ACT inhaler Inhale 2 puffs into the lungs every 4 (four) hours as needed. Wheezing       . Red Yeast Rice 600 MG CAPS Take 1,200 mg by mouth daily.       . sodium chloride (OCEAN) 0.65 % nasal spray Place 1 spray into the nose 2 (two) times daily as needed. Allergies       . vitamin E 400 UNIT capsule Take 400 Units by mouth daily.       No current facility-administered medications for this visit.    Allergies:    Allergies  Allergen Reactions  . Sulfur Itching  . Ampicillin Itching and Rash    Social History:  The patient  reports that she quit smoking about 25 years ago. She has never  used smokeless tobacco. She reports that she does not drink alcohol or use illicit drugs.   ROS:  Please see the history of present illness.   No prolonged episodes of palpitation. She denies chest pain.   All other systems reviewed and negative.   OBJECTIVE: VS:  BP 114/70  Pulse 62  Ht 5' 1.25" (1.556 m)  Wt 169 lb 12.8 oz (77.021 kg)  BMI 31.81 kg/m2 Well nourished, well developed, in no acute distress, obese HEENT: normal Neck: JVD flat. Carotid bruit absent  Cardiac:  normal S1, S2; RRR; no murmur Lungs:  clear to auscultation bilaterally, no wheezing, rhonchi or rales Abd: soft, nontender, no hepatomegaly Ext: Edema absent. Pulses 2+ Skin: warm and dry Neuro:  CNs 2-12 intact, no focal abnormalities noted  EKG:  Not repeated       Signed, Illene Labrador III, MD 09/06/2013 9:52 AM

## 2013-09-06 NOTE — Patient Instructions (Signed)
Your physician has recommended you make the following change in your medication:  1) STOP Diltiazem  Ok to hold Eliquis 3 days prior to your upcoming procedure. Resume when that physician thinks it is safe to do so.  Decrease your fluid intake  Your physician wants you to follow-up in: 3 months with Dr.Smith You will receive a reminder letter in the mail two months in advance. If you don't receive a letter, please call our office to schedule the follow-up appointment.

## 2013-09-14 ENCOUNTER — Ambulatory Visit: Payer: Medicare PPO | Admitting: Interventional Cardiology

## 2013-12-07 ENCOUNTER — Encounter: Payer: Self-pay | Admitting: Interventional Cardiology

## 2013-12-07 ENCOUNTER — Ambulatory Visit (INDEPENDENT_AMBULATORY_CARE_PROVIDER_SITE_OTHER): Payer: Medicare PPO | Admitting: Interventional Cardiology

## 2013-12-07 VITALS — BP 127/78 | HR 59 | Ht 61.0 in | Wt 167.0 lb

## 2013-12-07 DIAGNOSIS — Z7901 Long term (current) use of anticoagulants: Secondary | ICD-10-CM

## 2013-12-07 DIAGNOSIS — Z5181 Encounter for therapeutic drug level monitoring: Secondary | ICD-10-CM

## 2013-12-07 DIAGNOSIS — I5032 Chronic diastolic (congestive) heart failure: Secondary | ICD-10-CM

## 2013-12-07 DIAGNOSIS — I10 Essential (primary) hypertension: Secondary | ICD-10-CM

## 2013-12-07 DIAGNOSIS — Z79899 Other long term (current) drug therapy: Secondary | ICD-10-CM

## 2013-12-07 LAB — BASIC METABOLIC PANEL
BUN: 10 mg/dL (ref 6–23)
CALCIUM: 9.3 mg/dL (ref 8.4–10.5)
CO2: 27 meq/L (ref 19–32)
Chloride: 99 mEq/L (ref 96–112)
Creatinine, Ser: 1.1 mg/dL (ref 0.4–1.2)
GFR: 52.8 mL/min — AB (ref >60.00)
GLUCOSE: 87 mg/dL (ref 70–99)
Potassium: 3.7 mEq/L (ref 3.5–5.1)
Sodium: 139 mEq/L (ref 135–145)

## 2013-12-07 LAB — HEPATIC FUNCTION PANEL
ALBUMIN: 4 g/dL (ref 3.5–5.2)
ALK PHOS: 66 U/L (ref 39–117)
ALT: 18 U/L (ref 0–35)
AST: 21 U/L (ref 0–37)
BILIRUBIN DIRECT: 0 mg/dL (ref 0.0–0.3)
TOTAL PROTEIN: 7.4 g/dL (ref 6.0–8.3)
Total Bilirubin: 0.5 mg/dL (ref 0.2–1.2)

## 2013-12-07 LAB — CBC WITH DIFFERENTIAL/PLATELET
Basophils Absolute: 0 10*3/uL (ref 0.0–0.1)
Basophils Relative: 0.5 % (ref 0.0–3.0)
Eosinophils Absolute: 0.2 10*3/uL (ref 0.0–0.7)
Eosinophils Relative: 3 % (ref 0.0–5.0)
HCT: 36.1 % (ref 36.0–46.0)
Hemoglobin: 11.8 g/dL — ABNORMAL LOW (ref 12.0–15.0)
LYMPHS ABS: 4 10*3/uL (ref 0.7–4.0)
MCHC: 32.7 g/dL (ref 30.0–36.0)
MCV: 83.9 fl (ref 78.0–100.0)
MONOS PCT: 5.6 % (ref 3.0–12.0)
Monocytes Absolute: 0.4 10*3/uL (ref 0.1–1.0)
NEUTROS ABS: 2.5 10*3/uL (ref 1.4–7.7)
Neutrophils Relative %: 34.9 % — ABNORMAL LOW (ref 43.0–77.0)
PLATELETS: 267 10*3/uL (ref 150.0–400.0)
RBC: 4.3 Mil/uL (ref 3.87–5.11)
RDW: 15.6 % — ABNORMAL HIGH (ref 11.5–15.5)
WBC: 7.1 10*3/uL (ref 4.0–10.5)

## 2013-12-07 LAB — TSH: TSH: 0.5 u[IU]/mL (ref 0.35–4.50)

## 2013-12-07 NOTE — Progress Notes (Signed)
Patient ID: Katherine Floyd, female   DOB: 19-Jun-1945, 68 y.o.   MRN: [id number]    [address] [place], [place]  [address] Phone: [phone] Fax:  [phone]  Date:  12/07/2013   ID:  Katherine Floyd, DOB [birth year], MRN [id number]  PCP:  Tula Nakayama   ASSESSMENT:  1. Atrial fibrillation has not recurred on amiodarone suppression 2. Chronic anticoagulation with Eliquis 3. Amiodarone therapy with no obvious complications 4. Hypertension   PLAN:  1. Check TSH and hepatic panel. 2. Check basic metabolic panel and CBC to evaluate status on anticoagulation.  3. Clinical followup in 6 months with TSH, hepatic panel, and p.m. lateral chest x-ray 4. No change in the current medical regimen   SUBJECTIVE: Katherine Floyd is a 68 y.o. female who has no cardiopulmonary complaints. She denies orthopnea, PND, dizziness, edema, orthopnea, and prolonged palpitations.   Wt Readings from Last 3 Encounters:  12/07/13 167 lb (75.751 kg)  09/06/13 169 lb 12.8 oz (77.021 kg)  07/21/13 168 lb 6.4 oz (76.386 kg)     Past Medical History  Diagnosis Date  . Asthma   . GERD (gastroesophageal reflux disease)   . Osteoporosis   . Urinary tract infection 08/29/11    Cipro per PCP- states is resolving  . Anxiety   . Kidney stone   . Polyp of colon, adenomatous     recurrent  . Arthritis     with fracture right great toe 08/30/11 from "stepping wrong"  . Hypertension   . Dysrhythmia 06/21/2013    NEW ONSET ATRIAL FIBRILATION/RVR    Current Outpatient Prescriptions  Medication Sig Dispense Refill  . ADVAIR DISKUS 100-50 MCG/DOSE AEPB Inhale 1 puff into the lungs 2 (two) times daily.       Marland Kitchen ALPRAZolam (XANAX) 0.5 MG tablet Take 0.25-0.5 mg by mouth 3 (three) times daily as needed. Anxiety       . amiodarone (PACERONE) 200 MG tablet Take 1 tablet (200 mg total) by mouth daily.      Marland Kitchen apixaban (ELIQUIS) 5 MG TABS tablet Take 1 tablet (5 mg total) by mouth 2 (two)  times daily.  60 tablet  11  . Calcium Carbonate-Vitamin D (CALCIUM-CARB 600 + D PO) Take 2 tablets by mouth daily.      . carboxymethylcellulose (REFRESH PLUS) 0.5 % SOLN Place 1 drop into both eyes 3 (three) times daily as needed. Dry eyes        . cetirizine (ZYRTEC) 10 MG tablet Take 10 mg by mouth daily.      Marland Kitchen docusate sodium (COLACE) 100 MG capsule Take 100 mg by mouth at bedtime.       . fluticasone (FLONASE) 50 MCG/ACT nasal spray       . furosemide (LASIX) 40 MG tablet Take 1 tablet (40 mg total) by mouth daily.      . montelukast (SINGULAIR) 10 MG tablet Take 10 mg by mouth at bedtime.       Marland Kitchen omeprazole (PRILOSEC) 40 MG capsule Take 40 mg by mouth daily.      Marland Kitchen POTASSIUM CHLORIDE PO Take 40 mg by mouth daily.      Marland Kitchen PROAIR HFA 108 (90 BASE) MCG/ACT inhaler Inhale 2 puffs into the lungs every 4 (four) hours as needed. Wheezing       . Red Yeast Rice 600 MG CAPS Take 1,200 mg by mouth daily.       . sodium chloride (OCEAN) 0.65 %  nasal spray Place 1 spray into the nose 2 (two) times daily as needed. Allergies       . vitamin E 400 UNIT capsule Take 400 Units by mouth daily.       No current facility-administered medications for this visit.    Allergies:    Allergies  Allergen Reactions  . Sulfur Itching  . Ampicillin Itching and Rash    Social History:  The patient  reports that she quit smoking about 25 years ago. She has never used smokeless tobacco. She reports that she does not drink alcohol or use illicit drugs.   ROS:  Please see the history of present illness.   She has not noticed blood in her urine or stool. There are no neurological complaints.   All other systems reviewed and negative.   OBJECTIVE: VS:  BP 127/78  Pulse 59  Ht 5\' 1"  (1.549 m)  Wt 167 lb (75.751 kg)  BMI 31.57 kg/m2 Well nourished, well developed, in no acute distress, elderly HEENT: normal Neck: JVD flat. Carotid bruit absent  Cardiac:  normal S1, S2; RRR; no murmur Lungs:  clear to  auscultation bilaterally, no wheezing, rhonchi or rales Abd: soft, nontender, no hepatomegaly Ext: Edema absent. Pulses 2+ Skin: warm and dry Neuro:  CNs 2-12 intact, no focal abnormalities noted  EKG:  Not performed       Signed, Illene Labrador III, MD 12/07/2013 12:04 PM

## 2013-12-07 NOTE — Patient Instructions (Signed)
Your physician recommends that you continue on your current medications as directed. Please refer to the Current Medication list given to you today.  Lab Today: Bmet, Cbc, Tsh, Hepatic  Your physician recommends that you return for lab work in: 6 months ( Tsh, Hepatic)  A chest x-ray takes a picture of the organs and structures inside the chest, including the heart, lungs, and blood vessels. This test can show several things, including, whether the heart is enlarges; whether fluid is building up in the lungs; and whether pacemaker / defibrillator leads are still in place.( To be preformed at [facility] in Feb 2016)  Your physician wants you to follow-up in: 6 months with Dr.Smith  You will receive a reminder letter in the mail two months in advance. If you don't receive a letter, please call our office to schedule the follow-up appointment.

## 2013-12-08 ENCOUNTER — Encounter: Payer: Self-pay | Admitting: Interventional Cardiology

## 2013-12-14 ENCOUNTER — Telehealth: Payer: Self-pay | Admitting: Interventional Cardiology

## 2013-12-14 NOTE — Telephone Encounter (Signed)
°  Patient would like lab results. Please call and advise.

## 2013-12-14 NOTE — Telephone Encounter (Signed)
PT'S  HUSBAND  AWARE OF LAB RESULTS .Adonis Housekeeper

## 2013-12-17 ENCOUNTER — Telehealth: Payer: Self-pay

## 2013-12-17 NOTE — Telephone Encounter (Signed)
pt aware of lab results.All labs are normal.pt verbalized understanding.

## 2013-12-17 NOTE — Telephone Encounter (Signed)
Message copied by Lamar Laundry on Fri Dec 17, 2013  2:37 PM ------      Message from: Daneen Schick      Created: Wed Dec 08, 2013  4:19 PM       All labs are normal. ------

## 2013-12-30 ENCOUNTER — Other Ambulatory Visit (HOSPITAL_COMMUNITY): Payer: Self-pay | Admitting: Interventional Cardiology

## 2013-12-31 ENCOUNTER — Other Ambulatory Visit (HOSPITAL_COMMUNITY): Payer: Self-pay | Admitting: Interventional Cardiology

## 2014-02-23 ENCOUNTER — Telehealth: Payer: Self-pay | Admitting: Interventional Cardiology

## 2014-02-23 ENCOUNTER — Other Ambulatory Visit (HOSPITAL_COMMUNITY): Payer: Self-pay | Admitting: Interventional Cardiology

## 2014-02-23 NOTE — Telephone Encounter (Signed)
New message    Patient calling has questions regarding exercise regimen.

## 2014-02-23 NOTE — Telephone Encounter (Signed)
Returned pt call.pt sts that she has begun an exercise regimen of walking on the treadmill or using an exercise bike. Pt wanted to know if that was ok. Adv her that Dr.Smith would encourage her to be active.pt want to know if she should have any restriction with exercise. Adv her I will fwd Dr.Smith a message and call back with his recommendation.pt verbalized understanding

## 2014-02-25 NOTE — Telephone Encounter (Signed)
Pt aware of Dr.Smith's recommendations for exercise.No restrictions. Pt verbalized understanding.

## 2014-02-25 NOTE — Telephone Encounter (Signed)
No restrictions

## 2014-05-04 ENCOUNTER — Other Ambulatory Visit: Payer: Self-pay | Admitting: Interventional Cardiology

## 2014-05-16 ENCOUNTER — Encounter: Payer: Self-pay | Admitting: Interventional Cardiology

## 2014-06-01 ENCOUNTER — Other Ambulatory Visit: Payer: Self-pay | Admitting: Interventional Cardiology

## 2014-06-02 ENCOUNTER — Other Ambulatory Visit: Payer: Self-pay | Admitting: Interventional Cardiology

## 2014-06-07 ENCOUNTER — Encounter: Payer: Self-pay | Admitting: Interventional Cardiology

## 2014-06-07 ENCOUNTER — Other Ambulatory Visit (INDEPENDENT_AMBULATORY_CARE_PROVIDER_SITE_OTHER): Payer: Medicare PPO

## 2014-06-07 ENCOUNTER — Ambulatory Visit (INDEPENDENT_AMBULATORY_CARE_PROVIDER_SITE_OTHER): Payer: Medicare PPO | Admitting: Interventional Cardiology

## 2014-06-07 VITALS — BP 122/76 | HR 64 | Ht 61.0 in | Wt 173.0 lb

## 2014-06-07 DIAGNOSIS — I480 Paroxysmal atrial fibrillation: Secondary | ICD-10-CM | POA: Insufficient documentation

## 2014-06-07 DIAGNOSIS — Z79899 Other long term (current) drug therapy: Secondary | ICD-10-CM

## 2014-06-07 DIAGNOSIS — I4891 Unspecified atrial fibrillation: Secondary | ICD-10-CM

## 2014-06-07 DIAGNOSIS — I10 Essential (primary) hypertension: Secondary | ICD-10-CM

## 2014-06-07 DIAGNOSIS — I5032 Chronic diastolic (congestive) heart failure: Secondary | ICD-10-CM

## 2014-06-07 DIAGNOSIS — Z5181 Encounter for therapeutic drug level monitoring: Secondary | ICD-10-CM

## 2014-06-07 NOTE — Progress Notes (Signed)
Cardiology Office Note   Date:  06/07/2014   ID:  SKARLETT SEDLACEK, DOB 07/31/1945, MRN [id number]  PCP:  Tula Nakayama  Cardiologist:   Sinclair Grooms, MD   No chief complaint on file.     History of Present Illness: Katherine Floyd is a 69 y.o. female who presents for follow-up of paroxysmal atrial fibrillation and diastolic heart failure. She has been recently found to have anemia. She needs to have colonoscopy. She has no cardiopulmonary complaints. She is on Eliquis and there is a possibility that this is potentiating the anemia. She has not had palpitations or lower extremity swelling.    Past Medical History  Diagnosis Date  . Asthma   . GERD (gastroesophageal reflux disease)   . Osteoporosis   . Urinary tract infection 08/29/11    Cipro per PCP- states is resolving  . Anxiety   . Kidney stone   . Polyp of colon, adenomatous     recurrent  . Arthritis     with fracture right great toe 08/30/11 from "stepping wrong"  . Hypertension   . Dysrhythmia 06/21/2013    NEW ONSET ATRIAL FIBRILATION/RVR    Past Surgical History  Procedure Laterality Date  . Tonsillectomy  age of 88  . Hand surgery  1994    cyst removed  . Bunionectomy  2002    right foot  . Knee surgery  1990  . Shoulder surgery  2007    right   . Abdominal hysterectomy  1984  . Back surgery  1997-1998    fell on ice and snow  . Uretheral dilitation    . Eye surgery  2000    cataract extraction with IOL  . Hemicolectomy  09/06/11     Current Outpatient Prescriptions  Medication Sig Dispense Refill  . ADVAIR DISKUS 100-50 MCG/DOSE AEPB Inhale 1 puff into the lungs 2 (two) times daily.     Marland Kitchen ALPRAZolam (XANAX) 0.5 MG tablet Take 0.25-0.5 mg by mouth 3 (three) times daily as needed. Anxiety     . amiodarone (PACERONE) 200 MG tablet TAKE 1 TABLET BY MOUTH TWICE DAILY 60 tablet 6  . apixaban (ELIQUIS) 5 MG TABS tablet Take 1 tablet (5 mg total) by mouth 2 (two) times daily. 60 tablet 11    . Calcium Carbonate-Vitamin D (CALCIUM-CARB 600 + D PO) Take 2 tablets by mouth daily.    . carboxymethylcellulose (REFRESH PLUS) 0.5 % SOLN Place 1 drop into both eyes 3 (three) times daily as needed. Dry eyes      . cetirizine (ZYRTEC) 10 MG tablet Take 10 mg by mouth daily.    Marland Kitchen docusate sodium (COLACE) 100 MG capsule Take 100 mg by mouth at bedtime.     . furosemide (LASIX) 40 MG tablet TAKE 1 TABLET BY MOUTH TWICE DAILY 60 tablet 3  . montelukast (SINGULAIR) 10 MG tablet Take 10 mg by mouth at bedtime.     Marland Kitchen omeprazole (PRILOSEC) 40 MG capsule Take 40 mg by mouth daily.    . potassium chloride SA (K-DUR,KLOR-CON) 20 MEQ tablet TAKE 1 TABLET BY MOUTH EVERY DAY 90 tablet 0  . PROAIR HFA 108 (90 BASE) MCG/ACT inhaler Inhale 2 puffs into the lungs every 4 (four) hours as needed. Wheezing     . Red Yeast Rice 600 MG CAPS Take 1,200 mg by mouth daily.     . sodium chloride (OCEAN) 0.65 % nasal spray Place 1 spray into the nose 2 (two) times  daily as needed. Allergies     . vitamin E 400 UNIT capsule Take 400 Units by mouth daily.     No current facility-administered medications for this visit.    Allergies:   Sulfur and Ampicillin    Social History:  The patient  reports that she quit smoking about 25 years ago. She has never used smokeless tobacco. She reports that she does not drink alcohol or use illicit drugs.   Family History:  The patient's family history includes Cancer in her cousin, maternal uncle, and mother.    ROS:  Please see the history of present illness.   Otherwise, review of systems are positive for anemia but no melena or bright red blood..   All other systems are reviewed and negative.    PHYSICAL EXAM: VS:  BP 122/76 mmHg  Pulse 64  Ht 5\' 1"  (1.549 m)  Wt 173 lb (78.472 kg)  BMI 32.70 kg/m2 , BMI Body mass index is 32.7 kg/(m^2). GEN: Well nourished, well developed, in no acute distress HEENT: normal Neck: no JVD, carotid bruits, or masses Cardiac: RRR;  no murmurs, rubs, or gallops,no edema  Respiratory:  clear to auscultation bilaterally, normal work of breathing GI: soft, nontender, nondistended, + BS MS: no deformity or atrophy Skin: warm and dry, no rash Neuro:  Strength and sensation are intact Psych: euthymic mood, full affect   EKG:  EKG is ordered today. The ekg ordered today demonstrates normal sinus rhythm with nonspecific T-wave flattening and poor R-wave progression   Recent Labs: 06/23/2013: Pro B Natriuretic peptide (BNP) 1223.0* 06/24/2013: Magnesium 1.8 12/07/2013: ALT 18; BUN 10; Creatinine 1.1; Hemoglobin 11.8*; Platelets 267.0; Potassium 3.7; Sodium 139; TSH 0.50    Lipid Panel No results found for: CHOL, TRIG, HDL, CHOLHDL, VLDL, LDLCALC, LDLDIRECT    Wt Readings from Last 3 Encounters:  06/07/14 173 lb (78.472 kg)  12/07/13 167 lb (75.751 kg)  09/06/13 169 lb 12.8 oz (77.021 kg)      Other studies Reviewed: Additional studies/ records that were reviewed today include: Laboratory data sent from [facility]. Liver and TSH are normal.. Review of the above records demonstrates: Hemoglobin is 10. The patient is planning to have a colonoscopy.   ASSESSMENT AND PLAN:  1.  Paroxysmal atrial fibrillation but maintaining normal sinus rhythm since starting amiodarone therapy greater than a year ago. 2. Chronic diastolic heart failure, stable. Only decompensates when in atrial fibrillation 3. Blood pressure is under excellent control 4. Amiodarone therapy without toxicity 5. Eliquis therapy with concomitant anemia that is being worked up by Dr. Collene Mares. There is no contraindication to colonoscopy. Eliquis will need to be stopped at least 48 hours prior to colonoscopy. 6. Cleared for upcoming colonoscopy by Dr. Ulice Dash. Mann   Current medicines are reviewed at length with the patient today.  The patient has concerns regarding medicines.  The following changes have been made:  It is okay to discontinue Eliquis at least  2 days prior to colonoscopy. I will convey this message to Dr. Collene Mares. Later this year we will discontinue amiodarone to see if atrial fibrillation recurs.  Labs/ tests ordered today include: TSH and a panic panel on return in 6 months   Orders Placed This Encounter  Procedures  . EKG 12-Lead     Disposition:   FU with Linard Millers in 6 months   Signed, Sinclair Grooms, MD  06/07/2014 10:19 AM    [place], [place]  [address] Phone: [phone]; Fax: [phone]

## 2014-06-07 NOTE — Patient Instructions (Signed)
Your physician recommends that you continue on your current medications as directed. Please refer to the Current Medication list given to you today.  Your physician wants you to follow-up in: 6 months with Dr.Smith You will receive a reminder letter in the mail two months in advance. If you don't receive a letter, please call our office to schedule the follow-up appointment.  

## 2014-07-08 ENCOUNTER — Other Ambulatory Visit: Payer: Self-pay | Admitting: Interventional Cardiology

## 2014-07-31 ENCOUNTER — Other Ambulatory Visit: Payer: Self-pay | Admitting: Interventional Cardiology

## 2014-10-24 ENCOUNTER — Other Ambulatory Visit: Payer: Self-pay

## 2014-11-17 ENCOUNTER — Encounter: Payer: Self-pay | Admitting: Interventional Cardiology

## 2014-11-22 ENCOUNTER — Telehealth: Payer: Self-pay

## 2014-11-22 DIAGNOSIS — E785 Hyperlipidemia, unspecified: Secondary | ICD-10-CM

## 2014-11-22 MED ORDER — ATORVASTATIN CALCIUM 10 MG PO TABS: 10.0000 mg | ORAL_TABLET | Freq: Every day | ORAL | Status: DC

## 2014-11-22 NOTE — Telephone Encounter (Signed)
Follow Up       Pt calling asking if she can be put on generic Lipitor. Please call back and advise.

## 2014-11-22 NOTE — Telephone Encounter (Signed)
Pt aware of lab results and Dr.Smith's recommendation. Lipids are too elevated. Start atorvastatin 10 mg daily. Recheck Lipid and liver in 8 weeks. Rx sent to pt pharmacy. Pt would like to repeat fasting labs, the same day as her appt with Dr.Smith on 9/14 @ 8:30 Pt adv to come to her appt fasting.  Pt is currently taking Red yeast rice and would like to know if Dr.Smith's thinks she should continue. Adv her I will fwd him the question and call back  With his response

## 2014-11-22 NOTE — Telephone Encounter (Signed)
-----   Message from Belva Crome, MD sent at 11/20/2014  3:14 PM EDT ----- Lipids are too elevated. Start atorvastatin 10 mg daily. Recheck Lipid and liver in 8 weeks.

## 2014-11-22 NOTE — Telephone Encounter (Signed)
Generic Lipitor is atorvastatin. She she used 10 mg per day. Red yeast rice should be discontinued.

## 2014-11-23 NOTE — Telephone Encounter (Signed)
Pt aware generic Lipitor (Atorvastatin) has been sent to her pharmacy. Per Dr.Smith she should d/c Red Yeast Rice. Pt verbalized understanding.

## 2014-12-07 ENCOUNTER — Other Ambulatory Visit: Payer: Self-pay | Admitting: Family Medicine

## 2014-12-07 DIAGNOSIS — R1907 Generalized intra-abdominal and pelvic swelling, mass and lump: Secondary | ICD-10-CM

## 2014-12-08 ENCOUNTER — Other Ambulatory Visit: Payer: Self-pay | Admitting: Family Medicine

## 2014-12-08 ENCOUNTER — Ambulatory Visit
Admission: RE | Admit: 2014-12-08 | Discharge: 2014-12-08 | Disposition: A | Discharge status: Home / Self Care | Payer: Medicare HMO | Source: Ambulatory Visit | Attending: Family Medicine | Admitting: Family Medicine

## 2014-12-08 DIAGNOSIS — R1907 Generalized intra-abdominal and pelvic swelling, mass and lump: Secondary | ICD-10-CM

## 2015-01-10 NOTE — Progress Notes (Signed)
Cardiology Office Note   Date:  01/11/2015   ID:  Katherine Floyd, DOB 1945-10-23, MRN [id number]  PCP:  Tula Nakayama  Cardiologist:  Sinclair Grooms, MD   Chief Complaint  Patient presents with  . Atrial Fibrillation      History of Present Illness: Katherine Floyd is a 69 y.o. female who presents for paroxysmal atrial fibrillation that was persistent during acute  pericarditis,  History of acute pericarditis, acute diastolic heart failure when in atrial fibrillation, hypertension, and obesity.  She is doing well. She's had no bleeding on anticoagulation. She has a history of colonic polyps. She denies palpitations, dyspnea, and symptoms of heart failure.    Past Medical History  Diagnosis Date  . Asthma   . GERD (gastroesophageal reflux disease)   . Osteoporosis   . Urinary tract infection 08/29/11    Cipro per PCP- states is resolving  . Anxiety   . Kidney stone   . Polyp of colon, adenomatous     recurrent  . Arthritis     with fracture right great toe 08/30/11 from "stepping wrong"  . Hypertension   . Dysrhythmia 06/21/2013    NEW ONSET ATRIAL FIBRILATION/RVR    Past Surgical History  Procedure Laterality Date  . Tonsillectomy  age of 33  . Hand surgery  1994    cyst removed  . Bunionectomy  2002    right foot  . Knee surgery  1990  . Shoulder surgery  2007    right   . Abdominal hysterectomy  1984  . Back surgery  1997-1998    fell on ice and snow  . Uretheral dilitation    . Eye surgery  2000    cataract extraction with IOL  . Hemicolectomy  09/06/11     Current Outpatient Prescriptions  Medication Sig Dispense Refill  . ADVAIR DISKUS 100-50 MCG/DOSE AEPB Inhale 1 puff into the lungs 2 (two) times daily.     Marland Kitchen ALPRAZolam (XANAX) 0.5 MG tablet Take 0.25-0.5 mg by mouth 3 (three) times daily as needed. Anxiety     . amiodarone (PACERONE) 200 MG tablet TAKE 1 TABLET BY MOUTH TWICE DAILY 60 tablet 6  . atorvastatin (LIPITOR) 10 MG tablet  Take 1 tablet (10 mg total) by mouth daily. 30 tablet 5  . Calcium Carbonate-Vitamin D (CALCIUM-CARB 600 + D PO) Take 2 tablets by mouth daily.    . carboxymethylcellulose (REFRESH PLUS) 0.5 % SOLN Place 1 drop into both eyes 3 (three) times daily as needed. Dry eyes      . cetirizine (ZYRTEC) 10 MG tablet Take 10 mg by mouth daily.    Marland Kitchen docusate sodium (COLACE) 100 MG capsule Take 100 mg by mouth at bedtime.     Marland Kitchen ELIQUIS 5 MG TABS tablet TAKE 1 TABLET BY MOUTH TWICE DAILY 60 tablet 5  . furosemide (LASIX) 40 MG tablet Take 40 mg by mouth daily.    . Lutein 6 MG CAPS Take 6 mg by mouth daily.    . montelukast (SINGULAIR) 10 MG tablet Take 10 mg by mouth at bedtime.     Marland Kitchen omeprazole (PRILOSEC) 40 MG capsule Take 40 mg by mouth daily.    . potassium chloride SA (K-DUR,KLOR-CON) 20 MEQ tablet TAKE 1 TABLET BY MOUTH EVERY DAY 90 tablet 1  . sodium chloride (OCEAN) 0.65 % nasal spray Place 1 spray into the nose 2 (two) times daily as needed. Allergies     . vitamin  E 400 UNIT capsule Take 400 Units by mouth daily.     No current facility-administered medications for this visit.    Allergies:   Sulfur and Ampicillin    Social History:  The patient  reports that she quit smoking about 26 years ago. She has never used smokeless tobacco. She reports that she does not drink alcohol or use illicit drugs.   Family History:  The patient's family history includes Cancer in her cousin, maternal uncle, and mother.    ROS:  Please see the history of present illness.   Otherwise, review of systems are positive for low back discomfort and occasional lower extremity swelling..   All other systems are reviewed and negative.    PHYSICAL EXAM: VS:  BP 116/60 mmHg  Pulse 63  Ht 5\' 1"  (1.549 m)  Wt 79.742 kg (175 lb 12.8 oz)  BMI 33.23 kg/m2  SpO2 99% , BMI Body mass index is 33.23 kg/(m^2). GEN: Well nourished, well developed, in no acute distress HEENT: normal Neck: no JVD, carotid bruits, or  masses Cardiac: RRR.  There is no murmur, rub, or gallop. There is no edema. Respiratory:  clear to auscultation bilaterally, normal work of breathing. GI: soft, nontender, nondistended, + BS MS: no deformity or atrophy Skin: warm and dry, no rash Neuro:  Strength and sensation are intact Psych: euthymic mood, full affect   EKG:  EKG ist ordered today and reveals normal sinus rhythm/bradycardia with decreased voltage.   Recent Labs: No results found for requested labs within last 365 days.    Lipid Panel No results found for: CHOL, TRIG, HDL, CHOLHDL, VLDL, LDLCALC, LDLDIRECT    Wt Readings from Last 3 Encounters:  01/11/15 79.742 kg (175 lb 12.8 oz)  06/07/14 78.472 kg (173 lb)  12/07/13 75.751 kg (167 lb)      Other studies Reviewed: Additional studies/ records that were reviewed today include: Old records were reviewed. .    ASSESSMENT AND PLAN:  1. Chronic diastolic HF (heart failure) No evidence of volume overload  2. Paroxysmal atrial fibrillation No clinical recurrences on current medical regimen  3. Hypokalemia Resolved and stable  4. Essential hypertension Elevated  5. Encounter for monitoring amiodarone therapy No evidence of toxicity. Since atrial fibrillation was likely related to pericarditis in the past, we should discontinue this medication and resume only if she has recurrences.   6. History of pericarditis No recurrence    Current medicines are reviewed at length with the patient today.  The patient has the following concerns regarding medicines: None.  The following changes/actions have been instituted:    Discontinue amiodarone  Holter monitor for 48 hours in 10 weeks  Clinical follow-up in 12 weeks  Call if palpitations or dyspnea   Labs/ tests ordered today include:   Orders Placed This Encounter  Procedures  . Holter monitor - 48 hour  . EKG 12-Lead     Disposition:   FU with HS in 3 months  Signed, Sinclair Grooms, MD  01/11/2015 9:34 AM    [facility] [address], [place], [place]  [address] Phone: [phone]; Fax: [phone]

## 2015-01-11 ENCOUNTER — Ambulatory Visit (INDEPENDENT_AMBULATORY_CARE_PROVIDER_SITE_OTHER): Payer: Medicare HMO | Admitting: Interventional Cardiology

## 2015-01-11 ENCOUNTER — Encounter: Payer: Self-pay | Admitting: Interventional Cardiology

## 2015-01-11 ENCOUNTER — Other Ambulatory Visit (INDEPENDENT_AMBULATORY_CARE_PROVIDER_SITE_OTHER): Payer: Medicare HMO | Admitting: *Deleted

## 2015-01-11 VITALS — BP 116/60 | HR 63 | Ht 61.0 in | Wt 175.8 lb

## 2015-01-11 DIAGNOSIS — E785 Hyperlipidemia, unspecified: Secondary | ICD-10-CM

## 2015-01-11 DIAGNOSIS — I5032 Chronic diastolic (congestive) heart failure: Secondary | ICD-10-CM

## 2015-01-11 DIAGNOSIS — Z8679 Personal history of other diseases of the circulatory system: Secondary | ICD-10-CM

## 2015-01-11 DIAGNOSIS — I480 Paroxysmal atrial fibrillation: Secondary | ICD-10-CM | POA: Diagnosis not present

## 2015-01-11 DIAGNOSIS — E876 Hypokalemia: Secondary | ICD-10-CM

## 2015-01-11 DIAGNOSIS — I10 Essential (primary) hypertension: Secondary | ICD-10-CM

## 2015-01-11 DIAGNOSIS — Z5181 Encounter for therapeutic drug level monitoring: Secondary | ICD-10-CM

## 2015-01-11 DIAGNOSIS — Z79899 Other long term (current) drug therapy: Secondary | ICD-10-CM

## 2015-01-11 LAB — LIPID PANEL
CHOLESTEROL: 179 mg/dL (ref 0–200)
HDL: 85.9 mg/dL (ref >39.00)
LDL Cholesterol: 80 mg/dL (ref 0–99)
NonHDL: 92.9
TRIGLYCERIDES: 66 mg/dL (ref 0.0–149.0)
Total CHOL/HDL Ratio: 2
VLDL: 13.2 mg/dL (ref 0.0–40.0)

## 2015-01-11 LAB — ALT: ALT: 14 U/L (ref 0–35)

## 2015-01-11 NOTE — Addendum Note (Signed)
Addended by: Eulis Foster on: 01/11/2015 08:29 AM   Modules accepted: Orders

## 2015-01-11 NOTE — Patient Instructions (Addendum)
Medication Instructions:  Your physician has recommended you make the following change in your medication:  STOP Amiodarone   Labwork: None ordered   Testing/Procedures: Your physician has recommended that you wear a holter monitor. Holter monitors are medical devices that record the heart's electrical activity. Doctors most often use these monitors to diagnose arrhythmias. Arrhythmias are problems with the speed or rhythm of the heartbeat. The monitor is a small, portable device. You can wear one while you do your normal daily activities. This is usually used to diagnose what is causing palpitations/syncope (passing out). (To be scheduled in 2 1/2 months)  Follow-Up: Your physician recommends that you schedule a follow-up appointment in: 3 months with Dr.Smith   Any Other Special Instructions Will Be Listed Below (If Applicable).

## 2015-01-13 ENCOUNTER — Telehealth: Payer: Self-pay

## 2015-01-13 DIAGNOSIS — E785 Hyperlipidemia, unspecified: Secondary | ICD-10-CM

## 2015-01-13 NOTE — Telephone Encounter (Signed)
-----   Message from Belva Crome, MD sent at 01/11/2015  4:41 PM EDT ----- Lipids are at target and liver okay. Repeat 1 year.

## 2015-01-13 NOTE — Telephone Encounter (Signed)
Pt aware of lab results with verbal understanding. Lipids are at target and liver okay. Repeat 1 year. Pt rqst a copy of lab and eliquis samples. Adv her that both will be left at the front desk for pick up. Pt voiced appreciation and verbalized understanding.

## 2015-01-16 ENCOUNTER — Other Ambulatory Visit: Payer: Self-pay | Admitting: Interventional Cardiology

## 2015-02-07 DIAGNOSIS — E785 Hyperlipidemia, unspecified: Secondary | ICD-10-CM | POA: Insufficient documentation

## 2015-02-07 DIAGNOSIS — J309 Allergic rhinitis, unspecified: Secondary | ICD-10-CM | POA: Insufficient documentation

## 2015-02-07 DIAGNOSIS — K2100 Gastro-esophageal reflux disease with esophagitis, without bleeding: Secondary | ICD-10-CM | POA: Insufficient documentation

## 2015-02-07 DIAGNOSIS — M810 Age-related osteoporosis without current pathological fracture: Secondary | ICD-10-CM | POA: Insufficient documentation

## 2015-02-07 DIAGNOSIS — F411 Generalized anxiety disorder: Secondary | ICD-10-CM | POA: Insufficient documentation

## 2015-02-18 ENCOUNTER — Other Ambulatory Visit: Payer: Self-pay | Admitting: Interventional Cardiology

## 2015-02-20 ENCOUNTER — Other Ambulatory Visit: Payer: Self-pay | Admitting: Interventional Cardiology

## 2015-02-21 ENCOUNTER — Other Ambulatory Visit: Payer: Self-pay

## 2015-02-21 MED ORDER — FUROSEMIDE 40 MG PO TABS: 40.0000 mg | ORAL_TABLET | Freq: Every day | ORAL | Status: DC

## 2015-03-04 IMAGING — CR DG CHEST 2V
2 series · 2 of 2 positions shown · non-contrast
Comparison: DG CHEST 2V dated 01/25/2013

CLINICAL DATA: Three day history of chest pain, history of atrial
fibrillation

EXAM:
CHEST  2 VIEW

[w chest pa]
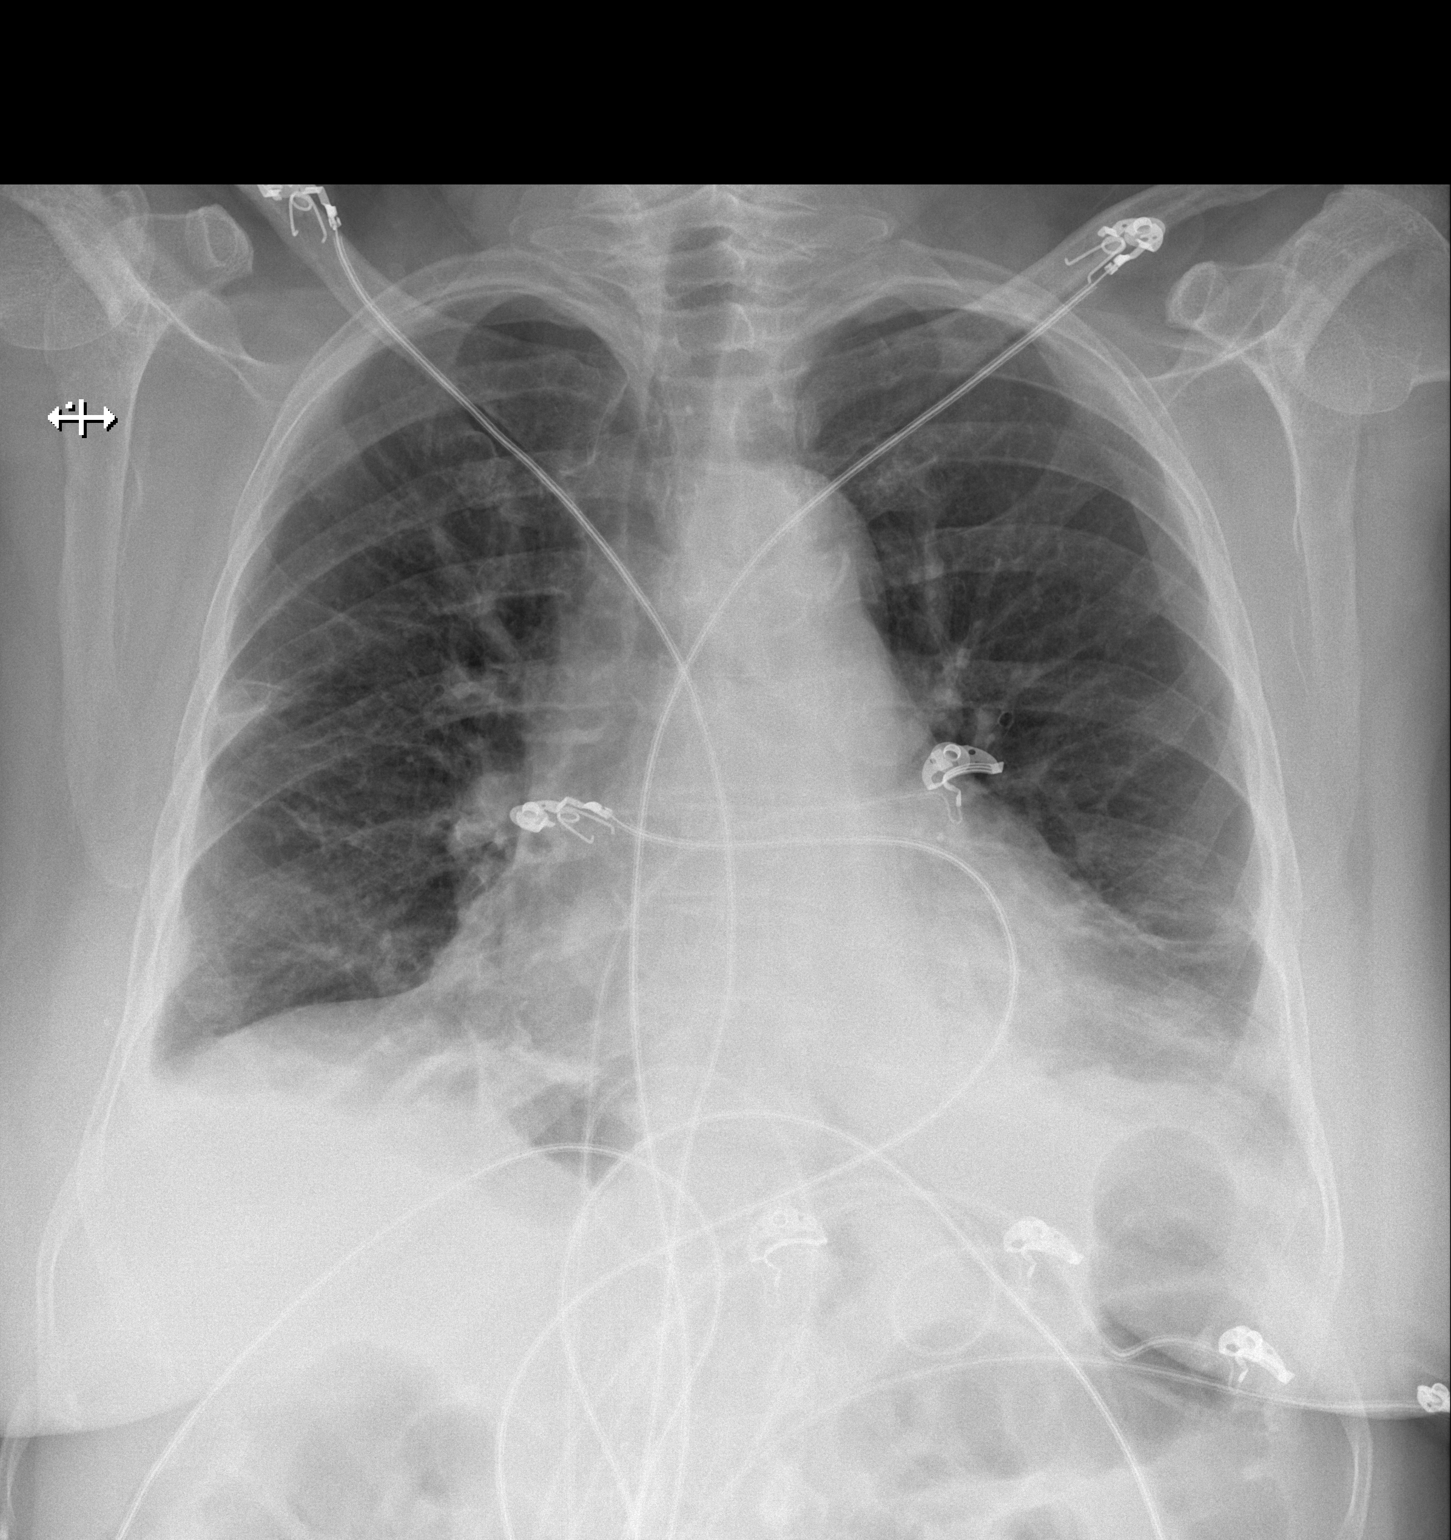

[w chest lat]
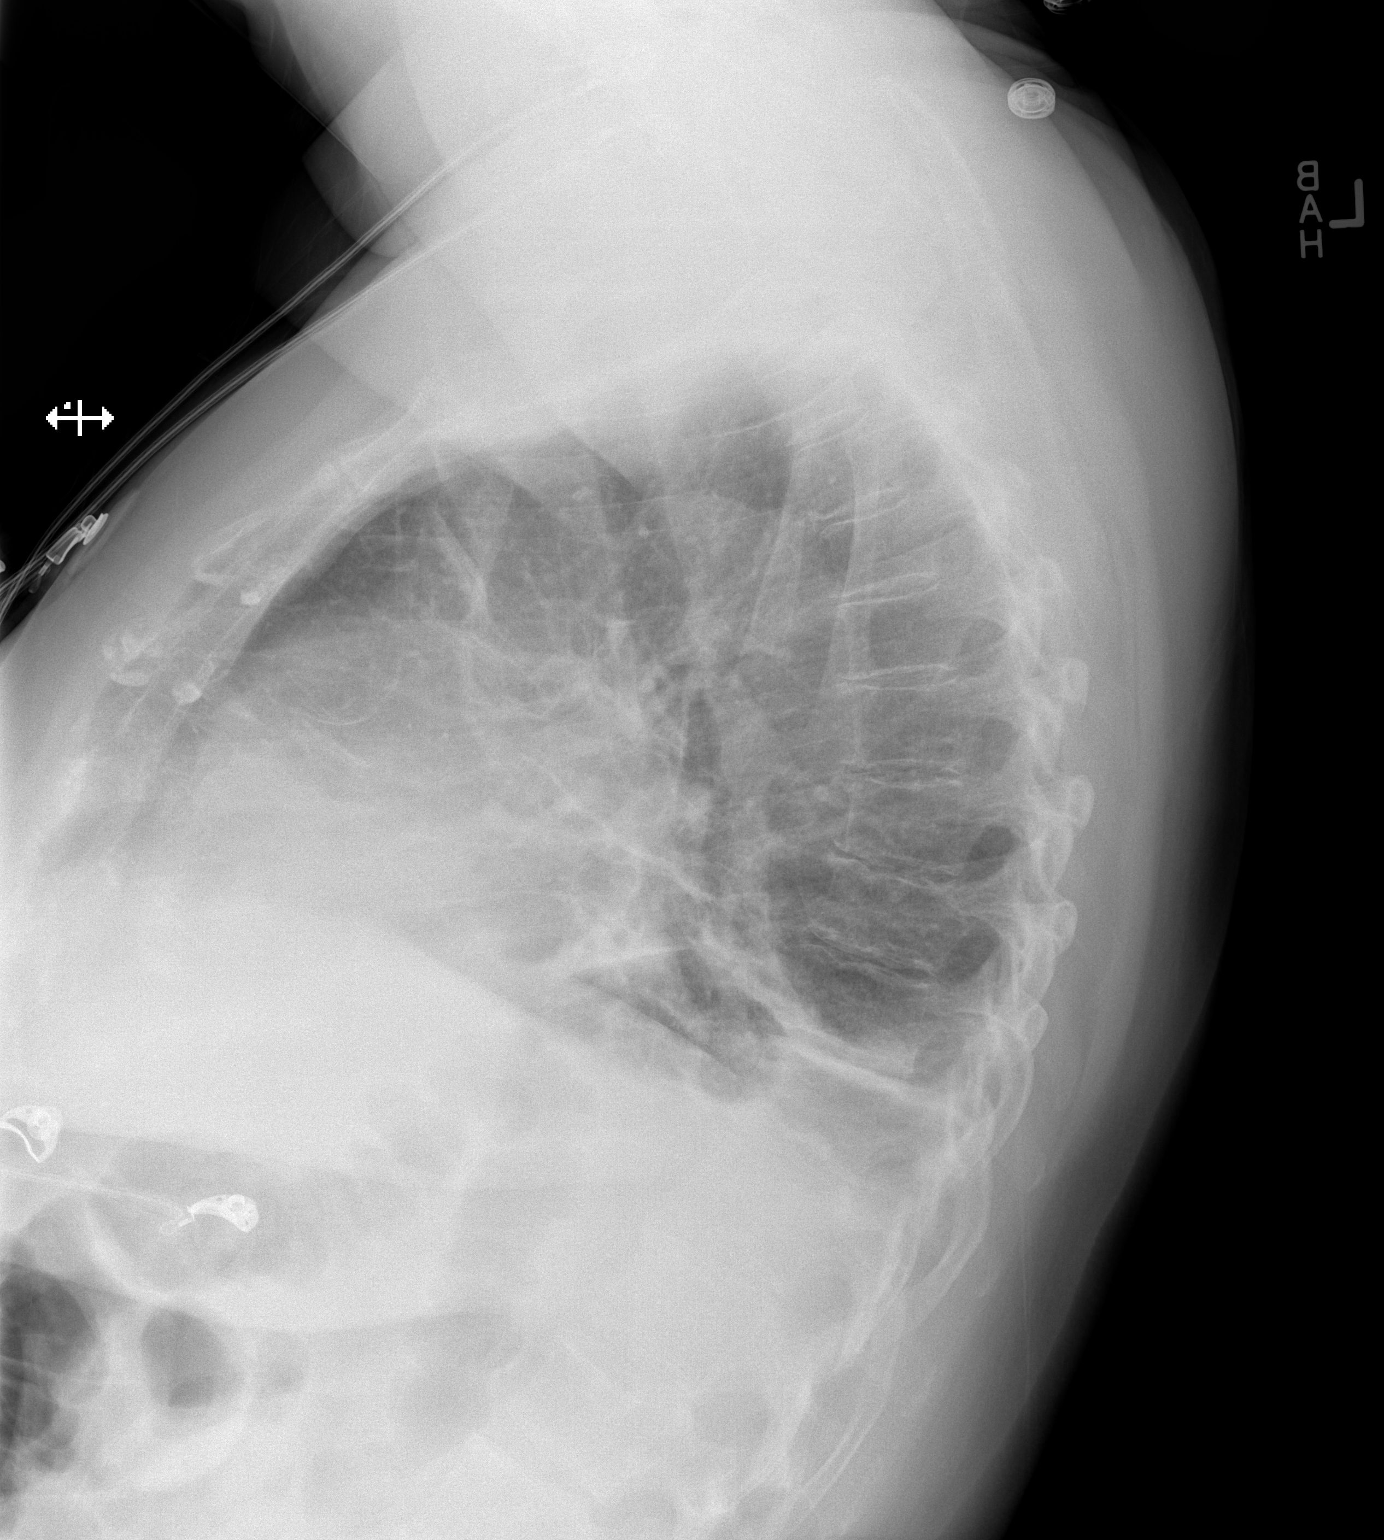

[2 of 2 positions shown; findings below may reference images not displayed]

FINDINGS: The lungs are adequately inflated. The interstitial markings are
increased bilaterally. There is subsegmental atelectasis at the lung
bases. A small amount of pleural fluid blunts the costophrenic
angles. The cardiac silhouette is top-normal to mildly enlarged.
Prominence of the central pulmonary vascularity is present.
IMPRESSION: Since the earlier study bibasilar atelectasis and small effusions
have developed. This may reflect mild CHF given the appearance of
the cardiac silhouette and pulmonary vascularity. There is no
classic alveolar pneumonia.

## 2015-03-11 ENCOUNTER — Emergency Department (HOSPITAL_COMMUNITY)
Admission: EM | Admit: 2015-03-11 | Discharge: 2015-03-11 | Disposition: A | Discharge status: Home / Self Care | Payer: Medicare HMO | Attending: Emergency Medicine | Admitting: Emergency Medicine

## 2015-03-11 ENCOUNTER — Encounter (HOSPITAL_COMMUNITY): Payer: Self-pay | Admitting: Emergency Medicine

## 2015-03-11 DIAGNOSIS — Z87440 Personal history of urinary (tract) infections: Secondary | ICD-10-CM | POA: Insufficient documentation

## 2015-03-11 DIAGNOSIS — Z87891 Personal history of nicotine dependence: Secondary | ICD-10-CM | POA: Diagnosis not present

## 2015-03-11 DIAGNOSIS — Y9289 Other specified places as the place of occurrence of the external cause: Secondary | ICD-10-CM | POA: Diagnosis not present

## 2015-03-11 DIAGNOSIS — Y93G1 Activity, food preparation and clean up: Secondary | ICD-10-CM | POA: Insufficient documentation

## 2015-03-11 DIAGNOSIS — S61219A Laceration without foreign body of unspecified finger without damage to nail, initial encounter: Secondary | ICD-10-CM

## 2015-03-11 DIAGNOSIS — Y998 Other external cause status: Secondary | ICD-10-CM | POA: Insufficient documentation

## 2015-03-11 DIAGNOSIS — Z79899 Other long term (current) drug therapy: Secondary | ICD-10-CM | POA: Diagnosis not present

## 2015-03-11 DIAGNOSIS — W260XXA Contact with knife, initial encounter: Secondary | ICD-10-CM | POA: Diagnosis not present

## 2015-03-11 DIAGNOSIS — J45909 Unspecified asthma, uncomplicated: Secondary | ICD-10-CM | POA: Diagnosis not present

## 2015-03-11 DIAGNOSIS — S61213A Laceration without foreign body of left middle finger without damage to nail, initial encounter: Secondary | ICD-10-CM | POA: Diagnosis not present

## 2015-03-11 DIAGNOSIS — S6992XA Unspecified injury of left wrist, hand and finger(s), initial encounter: Secondary | ICD-10-CM | POA: Diagnosis present

## 2015-03-11 DIAGNOSIS — Z87442 Personal history of urinary calculi: Secondary | ICD-10-CM | POA: Insufficient documentation

## 2015-03-11 DIAGNOSIS — K219 Gastro-esophageal reflux disease without esophagitis: Secondary | ICD-10-CM | POA: Diagnosis not present

## 2015-03-11 DIAGNOSIS — M1990 Unspecified osteoarthritis, unspecified site: Secondary | ICD-10-CM | POA: Diagnosis not present

## 2015-03-11 DIAGNOSIS — Z86010 Personal history of colonic polyps: Secondary | ICD-10-CM | POA: Insufficient documentation

## 2015-03-11 DIAGNOSIS — M810 Age-related osteoporosis without current pathological fracture: Secondary | ICD-10-CM | POA: Insufficient documentation

## 2015-03-11 HISTORY — DX: Hypotension, unspecified: I95.9

## 2015-03-11 MED ORDER — LIDOCAINE HCL (PF) 1 % IJ SOLN: 2.0000 mL | Freq: Once | INTRAMUSCULAR | Status: DC

## 2015-03-11 NOTE — Discharge Instructions (Signed)
Your laceration was repaired with Dermabond. Please allow this to come off on its own in about 10 days. Please see your primary physician, or return to the emergency department if bleeding is not controlled, or if any signs of infection. Stitches, Staples, or Adhesive Wound Closure Health care providers use stitches (sutures), staples, and certain glue (skin adhesives) to hold skin together while it heals (wound closure). You may need this treatment after you have surgery or if you cut your skin accidentally. These methods help your skin to heal more quickly and make it less likely that you will have a scar. A wound may take several months to heal completely. The type of wound you have determines when your wound gets closed. In most cases, the wound is closed as soon as possible (primary skin closure). Sometimes, closure is delayed so the wound can be cleaned and allowed to heal naturally. This reduces the chance of infection. Delayed closure may be needed if your wound:  Is caused by a bite.  Happened more than 6 hours ago.  Involves loss of skin or the tissues under the skin.  Has dirt or debris in it that cannot be removed.  Is infected. WHAT ARE THE DIFFERENT KINDS OF WOUND CLOSURES? There are many options for wound closure. The one that your health care provider uses depends on how deep and how large your wound is. Adhesive Glue To use this type of glue to close a wound, your health care provider holds the edges of the wound together and paints the glue on the surface of your skin. You may need more than one layer of glue. Then the wound may be covered with a light bandage (dressing). This type of skin closure may be used for small wounds that are not deep (superficial). Using glue for wound closure is less painful than other methods. It does not require a medicine that numbs the area (local anesthetic). This method also leaves nothing to be removed. Adhesive glue is often used for children  and on facial wounds. Adhesive glue cannot be used for wounds that are deep, uneven, or bleeding. It is not used inside of a wound.  Adhesive Strips These strips are made of sticky (adhesive), porous paper. They are applied across your skin edges like a regular adhesive bandage. You leave them on until they fall off. Adhesive strips may be used to close very superficial wounds. They may also be used along with sutures to improve the closure of your skin edges.  Sutures Sutures are the oldest method of wound closure. Sutures can be made from natural substances, such as silk, or from synthetic materials, such as nylon and steel. They can be made from a material that your body can break down as your wound heals (absorbable), or they can be made from a material that needs to be removed from your skin (nonabsorbable). They come in many different strengths and sizes. Your health care provider attaches the sutures to a steel needle on one end. Sutures can be passed through your skin, or through the tissues beneath your skin. Then they are tied and cut. Your skin edges may be closed in one continuous stitch or in separate stitches. Sutures are strong and can be used for all kinds of wounds. Absorbable sutures may be used to close tissues under the skin. The disadvantage of sutures is that they may cause skin reactions that lead to infection. Nonabsorbable sutures need to be removed. Staples When surgical staples are used  to close a wound, the edges of your skin on both sides of the wound are brought close together. A staple is placed across the wound, and an instrument secures the edges together. Staples are often used to close surgical cuts (incisions). Staples are faster to use than sutures, and they cause less skin reaction. Staples need to be removed using a tool that bends the staples away from your skin. HOW DO I CARE FOR MY WOUND CLOSURE?  Take medicines only as directed by your health care  provider.  If you were prescribed an antibiotic medicine for your wound, finish it all even if you start to feel better.  Use ointments or creams only as directed by your health care provider.  Wash your hands with soap and water before and after touching your wound.  Do not soak your wound in water. Do not take baths, swim, or use a hot tub until your health care provider approves.  Ask your health care provider when you can start showering. Cover your wound if directed by your health care provider.  Do not take out your own sutures or staples.  Do not pick at your wound. Picking can cause an infection.  Keep all follow-up visits as directed by your health care provider. This is important. HOW LONG WILL I HAVE MY WOUND CLOSURE?  Leave adhesive glue on your skin until the glue peels away.  Leave adhesive strips on your skin until the strips fall off.  Absorbable sutures will dissolve within several days.  Nonabsorbable sutures and staples must be removed. The location of the wound will determine how long they stay in. This can range from several days to a couple of weeks. WHEN SHOULD I SEEK HELP FOR MY WOUND CLOSURE? Contact your health care provider if:  You have a fever.  You have chills.  You have drainage, redness, swelling, or pain at your wound.  There is a bad smell coming from your wound.  The skin edges of your wound start to separate after your sutures have been removed.  Your wound becomes thick, raised, and darker in color after your sutures come out (scarring).   This information is not intended to replace advice given to you by your health care provider. Make sure you discuss any questions you have with your health care provider.   Document Released: 01/08/2001 Document Revised: 05/06/2014 Document Reviewed: 09/22/2013 Elsevier Interactive Patient Education [employer].

## 2015-03-11 NOTE — ED Notes (Signed)
Pt lacerated her finger with a kitchen knife while making a salad. Middle finger of left hand - pt states she is on blood thinner and finger won't stopped bleed- steri strip x2 and bandaid applied by pt at home prior to coming to er

## 2015-03-11 NOTE — ED Notes (Signed)
Discharge papers given to pt, reviewed care of glued lac-- verbalized back too this nurse instructions provided by PA . Ambulated off unit to husband in [place] room .

## 2015-03-11 NOTE — ED Provider Notes (Signed)
CSN: [id number]     Arrival date & time 03/11/15  1730 History   First MD Initiated Contact with Patient 03/11/15 1817     Chief Complaint  Patient presents with  . Extremity Laceration     (Consider location/radiation/quality/duration/timing/severity/associated sxs/prior Treatment) HPI Comments: Patient is a 69 year old female who presents to the emergency department with complaint of a laceration to the middle finger of the left hand.  The patient states that she was talking on the phone and preparing a salad, when she cut her finger with a knife. It is of note that she is on Eliquis because of a cardiac condition. She attempted to apply pressure at home, as well as a Steri-Strip and a Band-Aid, but continued to have bleeding. She presents to the emergency department for assistance with this issue.   The history is provided by the patient.    Past Medical History  Diagnosis Date  . Asthma   . GERD (gastroesophageal reflux disease)   . Osteoporosis   . Urinary tract infection 08/29/11    Cipro per PCP- states is resolving  . Anxiety   . Kidney stone   . Polyp of colon, adenomatous     recurrent  . Arthritis     with fracture right great toe 08/30/11 from "stepping wrong"  . Dysrhythmia 06/21/2013    NEW ONSET ATRIAL FIBRILATION/RVR  . Hypotension    Past Surgical History  Procedure Laterality Date  . Tonsillectomy  age of 76  . Hand surgery  1994    cyst removed  . Bunionectomy  2002    right foot  . Knee surgery  1990  . Shoulder surgery  2007    right   . Abdominal hysterectomy  1984  . Back surgery  1997-1998    fell on ice and snow  . Uretheral dilitation    . Eye surgery  2000    cataract extraction with IOL  . Hemicolectomy  09/06/11  . Polypectomy     Family History  Problem Relation Age of Onset  . Cancer Mother     breast  . Cancer Maternal Uncle     colon  . Cancer Cousin     breast   Social History  Substance Use Topics  . Smoking status: Former  Smoker -- 1.50 packs/day for 10 years    Quit date: 09/01/1988  . Smokeless tobacco: Never Used  . Alcohol Use: No   OB History    No data available     Review of Systems  Musculoskeletal: Positive for arthralgias.  Skin: Positive for wound.  All other systems reviewed and are negative.     Allergies  Sulfur and Ampicillin  Home Medications   Prior to Admission medications   Medication Sig Start Date End Date Taking? Authorizing Provider  ADVAIR DISKUS 250-50 MCG/DOSE AEPB Inhale 1 puff into the lungs 2 (two) times daily. 02/17/15  Yes Historical Provider, MD  ALPRAZolam Duanne Moron) 0.5 MG tablet Take 0.25-0.5 mg by mouth 3 (three) times daily as needed for anxiety. Anxiety 06/17/11  Yes Historical Provider, MD  atorvastatin (LIPITOR) 10 MG tablet Take 1 tablet (10 mg total) by mouth daily. 11/22/14  Yes Belva Crome, MD  Calcium Carbonate-Vitamin D (CALCIUM-CARB 600 + D PO) Take 2 tablets by mouth daily.   Yes Historical Provider, MD  carboxymethylcellulose (REFRESH PLUS) 0.5 % SOLN Place 1 drop into both eyes 3 (three) times daily as needed. Dry eyes   Yes Historical Provider, MD  cetirizine (ZYRTEC) 10 MG tablet Take 10 mg by mouth daily.   Yes Historical Provider, MD  docusate sodium (COLACE) 100 MG capsule Take 100 mg by mouth at bedtime.    Yes Historical Provider, MD  ELIQUIS 5 MG TABS tablet TAKE 1 TABLET BY MOUTH TWICE DAILY 02/20/15  Yes Belva Crome, MD  furosemide (LASIX) 40 MG tablet Take 1 tablet (40 mg total) by mouth daily. 02/21/15  Yes Belva Crome, MD  Lutein 6 MG CAPS Take 6 mg by mouth daily.   Yes Historical Provider, MD  montelukast (SINGULAIR) 10 MG tablet Take 10 mg by mouth at bedtime.  07/17/11  Yes Historical Provider, MD  omeprazole (PRILOSEC) 40 MG capsule Take 40 mg by mouth daily.   Yes Historical Provider, MD  potassium chloride SA (K-DUR,KLOR-CON) 20 MEQ tablet TAKE 1 TABLET BY MOUTH EVERY DAY 01/17/15  Yes Belva Crome, MD  sodium chloride (OCEAN)  0.65 % nasal spray Place 1 spray into the nose 2 (two) times daily as needed. Allergies    Yes Historical Provider, MD  vitamin E 400 UNIT capsule Take 400 Units by mouth daily.   Yes Historical Provider, MD  amiodarone (PACERONE) 200 MG tablet TAKE 1 TABLET BY MOUTH TWICE DAILY Patient not taking: Reported on 03/11/2015 06/02/14   Belva Crome, MD   BP 134/69 mmHg  Pulse 76  Temp(Src) 98 F (36.7 C) (Oral)  Resp 20  Ht 5\' 1"  (1.549 m)  Wt 170 lb (77.111 kg)  BMI 32.14 kg/m2  SpO2 95% Physical Exam  Constitutional: She is oriented to person, place, and time. She appears well-developed and well-nourished.  Non-toxic appearance.  HENT:  Head: Normocephalic.  Right Ear: Tympanic membrane and external ear normal.  Left Ear: Tympanic membrane and external ear normal.  Eyes: EOM and lids are normal. Pupils are equal, round, and reactive to light.  Neck: Normal range of motion. Neck supple. Carotid bruit is not present.  Cardiovascular: Normal rate, regular rhythm, normal heart sounds, intact distal pulses and normal pulses.   Pulmonary/Chest: Breath sounds normal. No respiratory distress.  Abdominal: Soft. Bowel sounds are normal. There is no tenderness. There is no guarding.  Musculoskeletal: Normal range of motion.  Patient has a 1 cm laceration to the medial aspect of the distal left middle finger. There is active bleeding present. There is no bone or tendon involvement. The nailbed disappeared.  Lymphadenopathy:       Head (right side): No submandibular adenopathy present.       Head (left side): No submandibular adenopathy present.    She has no cervical adenopathy.  Neurological: She is alert and oriented to person, place, and time. She has normal strength. No cranial nerve deficit or sensory deficit.  Skin: Skin is warm and dry.  Psychiatric: She has a normal mood and affect. Her speech is normal.  Nursing note and vitals reviewed.   ED Course  .Marland KitchenLaceration Repair Date/Time:  03/11/2015 6:50 PM Performed by: Lily Kocher Authorized by: Lily Kocher Consent: Verbal consent obtained. Risks and benefits: risks, benefits and alternatives were discussed Consent given by: patient Patient understanding: patient states understanding of the procedure being performed Patient identity confirmed: arm band Time out: Immediately prior to procedure a "time out" was called to verify the correct patient, procedure, equipment, support staff and site/side marked as required. Body area: upper extremity Location details: left long finger Laceration length: 1 cm Foreign bodies: no foreign bodies Tendon involvement: none Nerve involvement:  none Vascular damage: no Patient sedated: no Preparation: Patient was prepped and draped in the usual sterile fashion. Irrigation solution: saline Amount of cleaning: standard Skin closure: glue and Steri-Strips Approximation: close Approximation difficulty: simple Patient tolerance: Patient tolerated the procedure well with no immediate complications   (including critical care time) Labs Review Labs Reviewed - No data to display  Imaging Review No results found. I have personally reviewed and evaluated these images and lab results as part of my medical decision-making.   EKG Interpretation None      MDM  The patient's laceration to the finger was repaired with Steri-Strips and Dermabond to prevent additional trauma to the finger given the fact that the patient is on Eliquis. Bleeding was successfully stopped and laceration repaired with this technique. Discussed with the patient the need to return if bleeding restarts, or signs of infection. The patient acknowledges discharge instructions, and is in agreement with these discharge instructions. She is    Final diagnoses:  Laceration of finger of left hand, initial encounter    *I have reviewed nursing notes, vital signs, and all appropriate lab and imaging results for this  patient.[address] PA-C 03/11/15 [place], MD 03/12/15 [id number]

## 2015-03-27 ENCOUNTER — Ambulatory Visit (INDEPENDENT_AMBULATORY_CARE_PROVIDER_SITE_OTHER): Payer: Medicare HMO

## 2015-03-27 DIAGNOSIS — I480 Paroxysmal atrial fibrillation: Secondary | ICD-10-CM | POA: Diagnosis not present

## 2015-04-03 ENCOUNTER — Telehealth: Payer: Self-pay

## 2015-04-03 NOTE — Telephone Encounter (Signed)
Pt aware of holter monitor results. No atrial fibrillation is noted. Confirmed that she is off amiodarone. Pt verbalized understanding. Pt sts that she d/c Amio in Sept 2016

## 2015-04-03 NOTE — Telephone Encounter (Signed)
-----   Message from Belva Crome, MD sent at 03/30/2015  6:08 PM EST ----- No atrial fibrillation is noted. Confirmed that she is off amiodarone.

## 2015-04-17 NOTE — Progress Notes (Signed)
Cardiology Office Note   Date:  04/18/2015   ID:  Katherine Floyd, DOB 10/25/1945, MRN [id number]  PCP:  Tula Nakayama  Cardiologist:  Sinclair Grooms, MD   Chief Complaint  Patient presents with  . Atrial Fibrillation      History of Present Illness: Katherine Floyd is a 69 y.o. female who presents for pericarditis, PAF, chronic diastolic heart failure, essential hypertension, and chronic anticoagulation therapy.  She has a history of atrial fibrillation induced by acute pericarditis. This in turn cause acute diastolic heart failure. Atrial fibrillation was persistent. Therapy required amiodarone and anticoagulation therapy followed by electrical cardioversion. She is now greater than one half years post the episode and his been off of amiodarone for 3 months. Recent Holter did not demonstrate recurrent atrial fibrillation.  Past Medical History  Diagnosis Date  . Asthma   . GERD (gastroesophageal reflux disease)   . Osteoporosis   . Urinary tract infection 08/29/11    Cipro per PCP- states is resolving  . Anxiety   . Kidney stone   . Polyp of colon, adenomatous     recurrent  . Arthritis     with fracture right great toe 08/30/11 from "stepping wrong"  . Dysrhythmia 06/21/2013    NEW ONSET ATRIAL FIBRILATION/RVR  . Hypotension     Past Surgical History  Procedure Laterality Date  . Tonsillectomy  age of 44  . Hand surgery  1994    cyst removed  . Bunionectomy  2002    right foot  . Knee surgery  1990  . Shoulder surgery  2007    right   . Abdominal hysterectomy  1984  . Back surgery  1997-1998    fell on ice and snow  . Uretheral dilitation    . Eye surgery  2000    cataract extraction with IOL  . Hemicolectomy  09/06/11  . Polypectomy       Current Outpatient Prescriptions  Medication Sig Dispense Refill  . ACETAMINOPHEN PO Take 500 mg by mouth every 6 (six) hours as needed. (PAIN)    . ADVAIR DISKUS 250-50 MCG/DOSE AEPB Inhale 1 puff into  the lungs 2 (two) times daily.    Marland Kitchen ALPRAZolam (XANAX) 0.5 MG tablet Take 0.25-0.5 mg by mouth 3 (three) times daily as needed for anxiety. Anxiety    . atorvastatin (LIPITOR) 10 MG tablet Take 1 tablet (10 mg total) by mouth daily. 30 tablet 5  . Calcium Carbonate-Vitamin D (CALCIUM-CARB 600 + D PO) Take 2 tablets by mouth daily.    . carboxymethylcellulose (REFRESH PLUS) 0.5 % SOLN Place 1 drop into both eyes 3 (three) times daily as needed. Dry eyes    . cetirizine (ZYRTEC) 10 MG tablet Take 10 mg by mouth daily.    Marland Kitchen docusate sodium (COLACE) 100 MG capsule Take 100 mg by mouth at bedtime.     Marland Kitchen ELIQUIS 5 MG TABS tablet TAKE 1 TABLET BY MOUTH TWICE DAILY 60 tablet 5  . furosemide (LASIX) 40 MG tablet Take 1 tablet (40 mg total) by mouth daily. 30 tablet 11  . Lutein 6 MG CAPS Take 6 mg by mouth daily.    . montelukast (SINGULAIR) 10 MG tablet Take 10 mg by mouth at bedtime.     Marland Kitchen omeprazole (PRILOSEC) 40 MG capsule Take 40 mg by mouth daily.    . potassium chloride SA (K-DUR,KLOR-CON) 20 MEQ tablet TAKE 1 TABLET BY MOUTH EVERY DAY 90 tablet 0  .  sodium chloride (OCEAN) 0.65 % nasal spray Place 1 spray into the nose 2 (two) times daily as needed. Allergies     . vitamin E 400 UNIT capsule Take 400 Units by mouth daily.     No current facility-administered medications for this visit.    Allergies:   Sulfur and Ampicillin    Social History:  The patient  reports that she quit smoking about 26 years ago. She has never used smokeless tobacco. She reports that she does not drink alcohol or use illicit drugs.   Family History:  The patient's family history includes Cancer in her cousin, maternal uncle, and mother.    ROS:  Please see the history of present illness.   Otherwise, review of systems are positive for minimal ankle swelling and easy bruising and bleeding. Some anxiety..   All other systems are reviewed and negative.    PHYSICAL EXAM: VS:  BP 132/70 mmHg  Pulse 62  Ht 5\' 1"   (1.549 m)  Wt 179 lb 1.9 oz (81.248 kg)  BMI 33.86 kg/m2  SpO2 98% , BMI Body mass index is 33.86 kg/(m^2). GEN: Well nourished, well developed, in no acute distress HEENT: normal Neck: no JVD, carotid bruits, or masses Cardiac: RRR.  There is no murmur, rub, or gallop. There is no edema. Respiratory:  clear to auscultation bilaterally, normal work of breathing. GI: soft, nontender, nondistended, + BS MS: no deformity or atrophy Skin: warm and dry, no rash Neuro:  Strength and sensation are intact Psych: euthymic mood, full affect   EKG:  EKG is not ordered today.    Recent Labs: 01/11/2015: ALT 14    Lipid Panel    Component Value Date/Time   CHOL 179 01/11/2015 0829   TRIG 66.0 01/11/2015 0829   HDL 85.90 01/11/2015 0829   CHOLHDL 2 01/11/2015 0829   VLDL 13.2 01/11/2015 0829   LDLCALC 80 01/11/2015 0829      Wt Readings from Last 3 Encounters:  04/18/15 179 lb 1.9 oz (81.248 kg)  03/11/15 170 lb (77.111 kg)  01/11/15 175 lb 12.8 oz (79.742 kg)      Other studies Reviewed: Additional studies/ records that were reviewed today include: Recent Holter. The findings include the 48 hour Holter did not reveal atrial fib.    ASSESSMENT AND PLAN:  1. Paroxysmal atrial fibrillation (HCC) Presumed secondary to acute pericarditis. Off amiodarone now for greater than 3 months with no recurrence of atrial fibrillation.  2. Chronic diastolic HF (heart failure) ([place]) Resolved after resolution of true fibrillation  3. Encounter for monitoring amiodarone therapy Discontinued  4. Hyperlipidemia Stable and followed by primary care  5. Essential hypertension Controlled   Current medicines are reviewed at length with the patient today.  The patient has the following concerns regarding medicines: None.  The following changes/actions have been instituted:    Discontinue Eliquis  Start aspirin 81 mg daily  ECG in 6 months along with office visit  Labs/ tests  ordered today include:  No orders of the defined types were placed in this encounter.     Disposition:   FU with HS in 6 months  Signed, Sinclair Grooms, MD  04/18/2015 8:50 AM    [facility] [place], [place], [place]  [address] Phone: [phone]; Fax: [phone]

## 2015-04-18 ENCOUNTER — Encounter: Payer: Self-pay | Admitting: Interventional Cardiology

## 2015-04-18 ENCOUNTER — Ambulatory Visit (INDEPENDENT_AMBULATORY_CARE_PROVIDER_SITE_OTHER): Payer: Medicare HMO | Admitting: Interventional Cardiology

## 2015-04-18 VITALS — BP 132/70 | HR 62 | Ht 61.0 in | Wt 179.1 lb

## 2015-04-18 DIAGNOSIS — I5032 Chronic diastolic (congestive) heart failure: Secondary | ICD-10-CM

## 2015-04-18 DIAGNOSIS — Z79899 Other long term (current) drug therapy: Secondary | ICD-10-CM

## 2015-04-18 DIAGNOSIS — Z5181 Encounter for therapeutic drug level monitoring: Secondary | ICD-10-CM

## 2015-04-18 DIAGNOSIS — I480 Paroxysmal atrial fibrillation: Secondary | ICD-10-CM

## 2015-04-18 DIAGNOSIS — E785 Hyperlipidemia, unspecified: Secondary | ICD-10-CM | POA: Diagnosis not present

## 2015-04-18 DIAGNOSIS — I10 Essential (primary) hypertension: Secondary | ICD-10-CM

## 2015-04-18 MED ORDER — ASPIRIN EC 81 MG PO TBEC: 81.0000 mg | DELAYED_RELEASE_TABLET | Freq: Every day | ORAL | Status: DC

## 2015-04-18 NOTE — Patient Instructions (Signed)
Medication Instructions:  Your physician has recommended you make the following change in your medication:  1) STOP Eliquis 2) START Aspirin 81mg  daily on 04/21/15   Labwork: None ordered  Testing/Procedures: None ordered  Follow-Up: Your physician wants you to follow-up in: 6 months with Dr.Smith You will receive a reminder letter in the mail two months in advance. If you don't receive a letter, please call our office to schedule the follow-up appointment.   Any Other Special Instructions Will Be Listed Below (If Applicable).     If you need a refill on your cardiac medications before your next appointment, please call your pharmacy.

## 2015-04-22 ENCOUNTER — Other Ambulatory Visit: Payer: Self-pay | Admitting: Interventional Cardiology

## 2015-05-22 ENCOUNTER — Telehealth: Payer: Self-pay | Admitting: Interventional Cardiology

## 2015-05-22 NOTE — Telephone Encounter (Signed)
New message     Pt stopped eliquis on 04-20-15.  Can she now donate blood to the red cross?

## 2015-05-22 NOTE — Telephone Encounter (Signed)
Returned pt call. Adv pt that she should check with the [facility] for their medication guidelines for donating blood. Pt verbalized understanding.

## 2015-05-23 ENCOUNTER — Other Ambulatory Visit: Payer: Self-pay | Admitting: Interventional Cardiology

## 2015-09-28 ENCOUNTER — Ambulatory Visit (INDEPENDENT_AMBULATORY_CARE_PROVIDER_SITE_OTHER): Payer: Medicare HMO | Admitting: Interventional Cardiology

## 2015-09-28 ENCOUNTER — Encounter: Payer: Self-pay | Admitting: Interventional Cardiology

## 2015-09-28 VITALS — BP 132/72 | HR 65 | Ht 61.0 in | Wt 175.0 lb

## 2015-09-28 DIAGNOSIS — I480 Paroxysmal atrial fibrillation: Secondary | ICD-10-CM

## 2015-09-28 DIAGNOSIS — I5032 Chronic diastolic (congestive) heart failure: Secondary | ICD-10-CM

## 2015-09-28 DIAGNOSIS — I10 Essential (primary) hypertension: Secondary | ICD-10-CM

## 2015-09-28 DIAGNOSIS — Z7901 Long term (current) use of anticoagulants: Secondary | ICD-10-CM

## 2015-09-28 DIAGNOSIS — E785 Hyperlipidemia, unspecified: Secondary | ICD-10-CM

## 2015-09-28 NOTE — Patient Instructions (Signed)

## 2015-09-28 NOTE — Progress Notes (Signed)
Cardiology Office Note    Date:  09/28/2015   ID:  Katherine Floyd, DOB [birth year], MRN [id number]  PCP:  Tula Nakayama  Cardiologist: Sinclair Grooms, MD   Chief Complaint  Patient presents with  . Atrial Fibrillation  . Congestive Heart Failure    History of Present Illness:  Katherine Floyd is a 70 y.o. female follow-up of paroxysmal atrial fibrillation, diastolic heart failure, and essential hypertension.  The patient denies palpitations. There is been no lower extremity swelling, orthopnea, or PND. She did have a recent fall that led to a severe sprain of her left ankle which now requires that she walk with a 4 prong walker. Otherwise there are no complaints. No medication side effects.  Past Medical History  Diagnosis Date  . Asthma   . GERD (gastroesophageal reflux disease)   . Osteoporosis   . Urinary tract infection 08/29/11    Cipro per PCP- states is resolving  . Anxiety   . Kidney stone   . Polyp of colon, adenomatous     recurrent  . Arthritis     with fracture right great toe 08/30/11 from "stepping wrong"  . Dysrhythmia 06/21/2013    NEW ONSET ATRIAL FIBRILATION/RVR  . Hypotension     Past Surgical History  Procedure Laterality Date  . Tonsillectomy  age of 78  . Hand surgery  1994    cyst removed  . Bunionectomy  2002    right foot  . Knee surgery  1990  . Shoulder surgery  2007    right   . Abdominal hysterectomy  1984  . Back surgery  1997-1998    fell on ice and snow  . Uretheral dilitation    . Eye surgery  2000    cataract extraction with IOL  . Hemicolectomy  09/06/11  . Polypectomy      Current Medications: Outpatient Prescriptions Prior to Visit  Medication Sig Dispense Refill  . ACETAMINOPHEN PO Take 500 mg by mouth every 6 (six) hours as needed. (PAIN)    . ADVAIR DISKUS 250-50 MCG/DOSE AEPB Inhale 1 puff into the lungs 2 (two) times daily.    Marland Kitchen ALPRAZolam (XANAX) 0.5 MG tablet Take 0.25-0.5 mg by mouth 3 (three) times  daily as needed for anxiety. Anxiety    . aspirin EC 81 MG tablet Take 1 tablet (81 mg total) by mouth daily.    Marland Kitchen atorvastatin (LIPITOR) 10 MG tablet TAKE 1 TABLET(10 MG) BY MOUTH DAILY 30 tablet 10  . Calcium Carbonate-Vitamin D (CALCIUM-CARB 600 + D PO) Take 2 tablets by mouth daily.    . carboxymethylcellulose (REFRESH PLUS) 0.5 % SOLN Place 1 drop into both eyes 3 (three) times daily as needed. Dry eyes    . cetirizine (ZYRTEC) 10 MG tablet Take 10 mg by mouth daily.    Marland Kitchen docusate sodium (COLACE) 100 MG capsule Take 100 mg by mouth at bedtime.     . furosemide (LASIX) 40 MG tablet Take 1 tablet (40 mg total) by mouth daily. 30 tablet 11  . Lutein 6 MG CAPS Take 6 mg by mouth daily.    . montelukast (SINGULAIR) 10 MG tablet Take 10 mg by mouth at bedtime.     Marland Kitchen omeprazole (PRILOSEC) 40 MG capsule Take 40 mg by mouth daily.    . potassium chloride SA (K-DUR,KLOR-CON) 20 MEQ tablet TAKE 1 TABLET BY MOUTH EVERY DAY 90 tablet 1  . sodium chloride (OCEAN) 0.65 % nasal spray Place  1 spray into the nose 2 (two) times daily as needed. Allergies     . vitamin E 400 UNIT capsule Take 400 Units by mouth daily.     No facility-administered medications prior to visit.     Allergies:   Sulfur and Ampicillin   Social History   Social History  . Marital Status: Married    Spouse Name: N/A  . Number of Children: N/A  . Years of Education: N/A   Social History Main Topics  . Smoking status: Former Smoker -- 1.50 packs/day for 10 years    Quit date: 09/01/1988  . Smokeless tobacco: Never Used  . Alcohol Use: No  . Drug Use: No  . Sexual Activity: Not Asked   Other Topics Concern  . None   Social History Narrative     Family History:  The patient's family history includes Cancer in her cousin, maternal uncle, and mother.   ROS:   Please see the history of present illness.    Severe left ankle sprain. Relatively low hemoglobin when last checked.All other systems reviewed and are  negative.   PHYSICAL EXAM:   VS:  BP 132/72 mmHg  Pulse 65  Ht 5\' 1"  (1.549 m)  Wt 175 lb (79.379 kg)  BMI 33.08 kg/m2  SpO2 97%   GEN: Well nourished, well developed, in no acute distress HEENT: normal Neck: no JVD, carotid bruits, or masses Cardiac:  RRR; no murmurs, rubs, or gallops,no edema  Respiratory:  clear to auscultation bilaterally, normal work of breathing GI: soft, nontender, nondistended, + BS MS: no deformity or atrophy Skin: warm and dry, no rash Neuro:  Alert and Oriented x 3, Strength and sensation are intact Psych: euthymic mood, full affect  Wt Readings from Last 3 Encounters:  09/28/15 175 lb (79.379 kg)  04/18/15 179 lb 1.9 oz (81.248 kg)  03/11/15 170 lb (77.111 kg)      Studies/Labs Reviewed:   EKG: is not performed on today's visit.  Recent Labs: 01/11/2015: ALT 14   Lipid Panel    Component Value Date/Time   CHOL 179 01/11/2015 0829   TRIG 66.0 01/11/2015 0829   HDL 85.90 01/11/2015 0829   CHOLHDL 2 01/11/2015 0829   VLDL 13.2 01/11/2015 0829   LDLCALC 80 01/11/2015 0829    Additional studies/ records that were reviewed today include: Previous office notes were reviewed. 1. Paroxysmal atrial fibrillation ([place])   2. Chronic diastolic HF (heart failure) ([place])   3. Essential hypertension   4. Chronic anticoagulation   5. Hyperlipidemia      PLAN:  In order of problems listed above: 1. We will continue to clinically monitor for recurrence of atrial fibrillation. Her previous symptoms of been exertional fatigue and dyspnea. 2. Low-salt diet is recommended 3. Medication compliance is recommended and it appeared to by the patient. 2 g sodium diet 4. Aspirin has been substituted for anticoagulation therapy.   Medication Adjustments/Labs and Tests Ordered: Current medicines are reviewed at length with the patient today.  Concerns regarding medicines are outlined above.  Medication changes, Labs and Tests ordered today are listed in the  Patient Instructions below. There are no Patient Instructions on file for this visit.   Signed, Sinclair Grooms, MD  09/28/2015 9:29 AM    [facility] [place], [place], [place]  [address] Phone: [phone]; Fax: [phone]

## 2015-10-06 ENCOUNTER — Other Ambulatory Visit: Payer: Self-pay | Admitting: Orthopedic Surgery

## 2015-10-06 DIAGNOSIS — M25572 Pain in left ankle and joints of left foot: Secondary | ICD-10-CM

## 2015-10-11 ENCOUNTER — Ambulatory Visit
Admission: RE | Admit: 2015-10-11 | Discharge: 2015-10-11 | Disposition: A | Discharge status: Home / Self Care | Payer: Medicare HMO | Source: Ambulatory Visit | Attending: Orthopedic Surgery | Admitting: Orthopedic Surgery

## 2015-10-11 DIAGNOSIS — M25572 Pain in left ankle and joints of left foot: Secondary | ICD-10-CM

## 2015-10-17 ENCOUNTER — Other Ambulatory Visit: Payer: Medicare HMO

## 2015-10-19 ENCOUNTER — Other Ambulatory Visit: Payer: Self-pay | Admitting: Interventional Cardiology

## 2016-02-12 ENCOUNTER — Other Ambulatory Visit: Payer: Self-pay | Admitting: Interventional Cardiology

## 2016-10-08 NOTE — Progress Notes (Signed)
Cardiology Office Note    Date:  10/09/2016   ID:  Katherine Floyd, DOB 11/10/1945, MRN [id number]  PCP:  Aletha Halim., PA-C  Cardiologist: Sinclair Grooms, MD   Chief Complaint  Patient presents with  . Follow-up  . Atrial Fibrillation    History of Present Illness:  Katherine Floyd is a 71 y.o. female  follow-up of paroxysmal atrial fibrillation, diastolic heart failure, and essential hypertension  She is doing well. She wonders if she can use nonsteroidal anti-inflammatory agents. She is off chronic anticoagulation. She has had no recurrences of atrial fibrillation. She denies orthopnea, PND, exertional dyspnea, and edema.  Past Medical History:  Diagnosis Date  . Anxiety   . Arthritis    with fracture right great toe 08/30/11 from "stepping wrong"  . Asthma   . Dysrhythmia 06/21/2013   NEW ONSET ATRIAL FIBRILATION/RVR  . GERD (gastroesophageal reflux disease)   . Hypotension   . Kidney stone   . Osteoporosis   . Polyp of colon, adenomatous    recurrent  . Urinary tract infection 08/29/11   Cipro per PCP- states is resolving    Past Surgical History:  Procedure Laterality Date  . ABDOMINAL HYSTERECTOMY  1984  . BACK SURGERY  1997-1998   fell on ice and snow  . BUNIONECTOMY  2002   right foot  . EYE SURGERY  2000   cataract extraction with IOL  . HAND SURGERY  1994   cyst removed  . HEMICOLECTOMY  09/06/11  . KNEE SURGERY  1990  . POLYPECTOMY    . SHOULDER SURGERY  2007   right   . TONSILLECTOMY  age of 1  . uretheral dilitation      Current Medications: Outpatient Medications Prior to Visit  Medication Sig Dispense Refill  . ACETAMINOPHEN PO Take 500 mg by mouth every 6 (six) hours as needed. (PAIN)    . ALPRAZolam (XANAX) 0.5 MG tablet Take 0.25 mg by mouth daily. Anxiety    . aspirin EC 81 MG tablet Take 1 tablet (81 mg total) by mouth daily.    Marland Kitchen atorvastatin (LIPITOR) 10 MG tablet TAKE 1 TABLET(10 MG) BY MOUTH DAILY 30 tablet 10  .  Calcium Carbonate-Vitamin D (CALCIUM-CARB 600 + D PO) Take 2 tablets by mouth daily.    . carboxymethylcellulose (REFRESH PLUS) 0.5 % SOLN Place 1 drop into both eyes 3 (three) times daily as needed. Dry eyes    . cetirizine (ZYRTEC) 10 MG tablet Take 10 mg by mouth daily.    Marland Kitchen docusate sodium (COLACE) 100 MG capsule Take 100 mg by mouth at bedtime.     . furosemide (LASIX) 40 MG tablet TAKE 1 TABLET(40 MG) BY MOUTH DAILY 30 tablet 8  . Lutein 6 MG CAPS Take 6 mg by mouth daily.    . montelukast (SINGULAIR) 10 MG tablet Take 10 mg by mouth at bedtime.     Marland Kitchen omeprazole (PRILOSEC) 40 MG capsule Take 40 mg by mouth daily.    . potassium chloride SA (K-DUR,KLOR-CON) 20 MEQ tablet TAKE 1 TABLET BY MOUTH EVERY DAY 90 tablet 3  . vitamin E 400 UNIT capsule Take 400 Units by mouth daily.    Marland Kitchen ADVAIR DISKUS 250-50 MCG/DOSE AEPB Inhale 1 puff into the lungs 2 (two) times daily.    . ferrous gluconate (FERGON) 240 (27 FE) MG tablet Take 240 mg by mouth daily.    . sodium chloride (OCEAN) 0.65 % nasal spray Place  1 spray into the nose 2 (two) times daily as needed. Allergies      No facility-administered medications prior to visit.      Allergies:   Prednisone; Rofecoxib; Sulfamethoxazole; Sulfur; and Ampicillin   Social History   Social History  . Marital status: Married    Spouse name: N/A  . Number of children: N/A  . Years of education: N/A   Social History Main Topics  . Smoking status: Former Smoker    Packs/day: 1.50    Years: 10.00    Quit date: 09/01/1988  . Smokeless tobacco: Never Used  . Alcohol use No  . Drug use: No  . Sexual activity: Not Asked   Other Topics Concern  . None   Social History Narrative  . None     Family History:  The patient's family history includes Cancer in her cousin, maternal uncle, and mother.   ROS:   Please see the history of present illness.    Constipation. Arthritis and back discomfort for which she was to use nonsteroidal therapy. Fatigue  and sweating.  All other systems reviewed and are negative.   PHYSICAL EXAM:   VS:  BP 120/68   Pulse 64   Ht 5\' 1"  (1.549 m)   Wt 166 lb (75.3 kg)   BMI 31.37 kg/m    GEN: Well nourished, well developed, in no acute distress  HEENT: normal  Neck: no JVD, carotid bruits, or masses Cardiac: RRR; no murmurs, rubs, or gallops,no edema  Respiratory:  clear to auscultation bilaterally, normal work of breathing GI: soft, nontender, nondistended, + BS MS: no deformity or atrophy  Skin: warm and dry, no rash Neuro:  Alert and Oriented x 3, Strength and sensation are intact Psych: euthymic mood, full affect  Wt Readings from Last 3 Encounters:  10/09/16 166 lb (75.3 kg)  09/28/15 175 lb (79.4 kg)  04/18/15 179 lb 1.9 oz (81.2 kg)      Studies/Labs Reviewed:   EKG:  EKG  Normal sinus rhythm with nonspecific T-wave flattening  Recent Labs: No results found for requested labs within last 8760 hours.   Lipid Panel    Component Value Date/Time   CHOL 179 01/11/2015 0829   TRIG 66.0 01/11/2015 0829   HDL 85.90 01/11/2015 0829   CHOLHDL 2 01/11/2015 0829   VLDL 13.2 01/11/2015 0829   LDLCALC 80 01/11/2015 0829    Additional studies/ records that were reviewed today include:  None    ASSESSMENT:    1. Paroxysmal atrial fibrillation (HCC)   2. Chronic diastolic HF (heart failure) ([place])   3. Essential hypertension      PLAN:  In order of problems listed above:  1. No clinical recurrences have been noted over the past couple years. 2. No evidence of volume overload. She requested and explaination of diastolic heart failure. We covered this successfully. 3. Excellent blood pressure control.  Overall doing well. Being bothered more by arthritis and anything else. I gave her permission to use nonsteroidal anti-inflammatory therapy.  Medication Adjustments/Labs and Tests Ordered: Current medicines are reviewed at length with the patient today.  Concerns regarding  medicines are outlined above.  Medication changes, Labs and Tests ordered today are listed in the Patient Instructions below. Patient Instructions  Medication Instructions:  None  Labwork: None  Testing/Procedures: None  Follow-Up: Your physician wants you to follow-up in: 1 year with Dr. Tamala Julian.  You will receive a reminder letter in the mail two months in advance. If  you don't receive a letter, please call our office to schedule the follow-up appointment.     Any Other Special Instructions Will Be Listed Below (If Applicable).     If you need a refill on your cardiac medications before your next appointment, please call your pharmacy.      Signed, Sinclair Grooms, MD  10/09/2016 11:42 AM    [facility] [place], [place], [place]  [address] Phone: [phone]; Fax: [phone]

## 2016-10-09 ENCOUNTER — Encounter: Payer: Self-pay | Admitting: Interventional Cardiology

## 2016-10-09 ENCOUNTER — Ambulatory Visit (INDEPENDENT_AMBULATORY_CARE_PROVIDER_SITE_OTHER): Payer: Medicare HMO | Admitting: Interventional Cardiology

## 2016-10-09 VITALS — BP 120/68 | HR 64 | Ht 61.0 in | Wt 166.0 lb

## 2016-10-09 DIAGNOSIS — I10 Essential (primary) hypertension: Secondary | ICD-10-CM | POA: Diagnosis not present

## 2016-10-09 DIAGNOSIS — I480 Paroxysmal atrial fibrillation: Secondary | ICD-10-CM

## 2016-10-09 DIAGNOSIS — I5032 Chronic diastolic (congestive) heart failure: Secondary | ICD-10-CM | POA: Diagnosis not present

## 2016-10-09 NOTE — Patient Instructions (Signed)

## 2016-10-17 ENCOUNTER — Other Ambulatory Visit: Payer: Self-pay | Admitting: Interventional Cardiology

## 2017-01-02 ENCOUNTER — Ambulatory Visit: Payer: Self-pay | Admitting: General Surgery

## 2017-01-02 NOTE — H&P (Signed)
Katherine Floyd 12/31/2016 9:18 AM Location: [facility] Patient #: [id number] DOB: [birth year] Married / Language: English / Race: White Female   History of Present Illness [name] MD; 12/31/2016 12:29 PM) The patient is a 71 year old female.  Note:She is referred by Bing Matter, PA for consultation regarding an abdominal wall hernia. She underwent a laparoscopic-assisted partial colectomy in 2013. She has noticed a bulge at the superior aspect of the limited upper midline incision that is become larger and uncomfortable at times. No obstructive symptoms. She does have chronic constipation and takes intermittent MiraLAX for this. She is here with her husband for further evaluation. She does have allergies and sometimes has to take inhalers. She also has a history of paroxysmal atrial fibrillation and chronic diastolic heart failure. She has been followed by Dr. Daneen Schick for this.  Past Surgical History Alean Rinne, [place]; 12/31/2016 9:18 AM) Cataract Surgery  Left. Foot Surgery  Bilateral. Hysterectomy (not due to cancer) - Complete  Hysterectomy (not due to cancer) - Partial  Knee Surgery  Left. Oral Surgery  Resection of Small Bowel  Spinal Surgery - Lower Back  Tonsillectomy   Diagnostic Studies History Alean Rinne, [place]; 12/31/2016 9:18 AM) Mammogram  1-3 years ago Pap Smear  >5 years ago  Allergies Alean Rinne, RMA; 12/31/2016 9:23 AM) PredniSONE *CORTICOSTEROIDS*  Rofecoxib *ANALGESICS - ANTI-INFLAMMATORY*  Sulfamethoxazole *SULFONAMIDES*  Sulfur (Antiseborrheic) *DERMATOLOGICALS*  Ampicillin *PENICILLINS*   Medication History Alean Rinne, RMA; 12/31/2016 9:25 AM) ALPRAZolam (0.5MG  Tablet, Oral) Active. Atorvastatin Calcium (10MG  Tablet, Oral) Active. Fluticasone Propionate (50MCG/ACT Suspension, Nasal) Active. Furosemide (40MG  Tablet, Oral) Active. Montelukast Sodium (10MG  Tablet, Oral) Active. Potassium Chloride  Crys ER (20MEQ Tablet ER, Oral) Active. Symbicort (160-4.5MCG/ACT Aerosol, Inhalation) Active. Cetirizine HCl (10MG  Tablet, Oral daily) Active. Colace (100MG  Capsule, Oral) Active.  Social History Alean Rinne, [place]; 12/31/2016 9:18 AM) Alcohol use  Remotely quit alcohol use. Caffeine use  Coffee, Tea. No drug use  Tobacco use  Former smoker.  Family History Alean Rinne, [place]; 12/31/2016 9:18 AM) Arthritis  Father, Mother. Bleeding disorder  Mother. Breast Cancer  Mother. Colon Polyps  Mother. Heart Disease  Mother. Heart disease in female family member before age 21   Pregnancy / Birth History Alean Rinne, [place]; 12/31/2016 9:18 AM) Age at menarche  67 years. Gravida  0  Other Problems Alean Rinne, [place]; 12/31/2016 9:18 AM) Anxiety Disorder  Arthritis  Asthma  Back Pain  Bladder Problems  Congestive Heart Failure  Gastroesophageal Reflux Disease  Hemorrhoids  Oophorectomy     Review of Systems Alean Rinne RMA; 12/31/2016 9:18 AM) General Present- Chills, Fatigue and Night Sweats. Not Present- Appetite Loss, Fever, Weight Gain and Weight Loss. Skin Present- Change in Wart/Mole and Dryness. Not Present- Hives, Jaundice, New Lesions, Non-Healing Wounds, Rash and Ulcer. HEENT Present- Earache and Hoarseness. Not Present- Hearing Loss, Nose Bleed, Oral Ulcers, Ringing in the Ears, Seasonal Allergies, Sinus Pain, Sore Throat, Visual Disturbances, Wears glasses/contact lenses and Yellow Eyes. Respiratory Present- Wheezing. Not Present- Bloody sputum, Chronic Cough, Difficulty Breathing and Snoring. Cardiovascular Present- Shortness of Breath. Not Present- Chest Pain, Difficulty Breathing Lying Down, Leg Cramps, Palpitations, Rapid Heart Rate and Swelling of Extremities. Gastrointestinal Present- Abdominal Pain, Bloating, Constipation, Excessive gas and Gets full quickly at meals. Not Present- Bloody Stool, Change in Bowel Habits, Chronic diarrhea,  Difficulty Swallowing, Hemorrhoids, Indigestion, Nausea, Rectal Pain and Vomiting. Musculoskeletal Present- Back Pain, Joint Pain, Muscle Pain and Swelling of Extremities. Not Present- Joint Stiffness and  Muscle Weakness. Neurological Present- Headaches. Not Present- Decreased Memory, Fainting, Numbness, Seizures, Tingling, Tremor, Trouble walking and Weakness. Psychiatric Present- Change in Sleep Pattern. Not Present- Anxiety, Bipolar, Depression, Fearful and Frequent crying. Endocrine Present- Hot flashes. Not Present- Cold Intolerance, Excessive Hunger, Hair Changes, Heat Intolerance and New Diabetes. Hematology Present- Easy Bruising. Not Present- Blood Thinners, Excessive bleeding, Gland problems, HIV and Persistent Infections.   Physical Exam [name] MD; 12/31/2016 10:16 AM) The physical exam findings are as follows: Note:GENERAL APPEARANCE: Overweight female in NAD. Pleasant and cooperative.  EARS, NOSE, MOUTH THROAT: [place]/AT external ears: no lesions or deformities external nose: no lesions or deformities hearing: grossly normal lips: moist, no deformities EYES external: conjunctiva, lids, sclerae normal pupils: equal, round glasses: no   CV ascultation: RRR, no murmur extremity edema: no extremity varicosities: no   RESP/CHEST auscultation: breath sounds equal and clear respiratory effort: normal  GASTROINTESTINAL abdomen: Soft, non-tender, non-distended, no masses liver and spleen: not enlarged. 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 She is interested in repair.  Plan: We discussed laparoscopic incisional hernia repair with mesh. I have discussed the procedure, risks, and aftercare. Risks include but are not limited to bleeding, infection, wound healing problems, anesthesia, recurrence, accidental injury to intra-abdominal organs-such as intestine, liver, spleen, bladder, etc. We also discussed the rare complication of mesh rejection. All questions were answered.  Jackolyn Confer, M.D.

## 2017-01-23 NOTE — Progress Notes (Signed)
10-09-16 (EPIC) EKG

## 2017-01-23 NOTE — Patient Instructions (Signed)
Katherine Floyd  01/23/2017   Your procedure is scheduled on: 01-31-17   Report to [facility] Main  Entrance Take [place]  Elevators to 3rd floor to  [place] at 5:30 AM.   Call this number if you have problems the morning of surgery [phone]    Remember: ONLY 1 PERSON MAY GO WITH YOU TO SHORT STAY TO GET  READY MORNING OF [place].  Do not eat food or drink liquids :After Midnight.     Take these medicines the morning of surgery with A SIP OF WATER: Cetirizine (Zyrtec) and Omeprazole (Prilosec). You may also bring and use your inhaler and nasal spray as needed.                                You may not have any metal on your body including hair pins and              piercings  Do not wear jewelry, make-up, lotions, powders or perfumes, deodorant             Do not wear nail polish.  Do not shave  48 hours prior to surgery.                Do not bring valuables to the hospital. [place].  Contacts, dentures or bridgework may not be worn into surgery.  Leave suitcase in the car. After surgery it may be brought to your room.                  Please read over the following fact sheets you were given: _____________________________________________________________________             [facility] - Preparing for Surgery Before surgery, you can play an important role.  Because skin is not sterile, your skin needs to be as free of germs as possible.  You can reduce the number of germs on your skin by washing with CHG (chlorahexidine gluconate) soap before surgery.  CHG is an antiseptic cleaner which kills germs and bonds with the skin to continue killing germs even after washing. Please DO NOT use if you have an allergy to CHG or antibacterial soaps.  If your skin becomes reddened/irritated stop using the CHG and inform your nurse when you arrive at Short Stay. Do not shave (including legs and  underarms) for at least 48 hours prior to the first CHG shower.  You may shave your face/neck. Please follow these instructions carefully:  1.  Shower with CHG Soap the night before surgery and the  morning of Surgery.  2.  If you choose to wash your hair, wash your hair first as usual with your  normal  shampoo.  3.  After you shampoo, rinse your hair and body thoroughly to remove the  shampoo.                           4.  Use CHG as you would any other liquid soap.  You can apply chg directly  to the skin and wash  Gently with a scrungie or clean washcloth.  5.  Apply the CHG Soap to your body ONLY FROM THE NECK DOWN.   Do not use on face/ open                           Wound or open sores. Avoid contact with eyes, ears mouth and genitals (private parts).                       Wash face,  Genitals (private parts) with your normal soap.             6.  Wash thoroughly, paying special attention to the area where your surgery  will be performed.  7.  Thoroughly rinse your body with warm water from the neck down.  8.  DO NOT shower/wash with your normal soap after using and rinsing off  the CHG Soap.                9.  Pat yourself dry with a clean towel.            10.  Wear clean pajamas.            11.  Place clean sheets on your bed the night of your first shower and do not  sleep with pets. Day of Surgery : Do not apply any lotions/deodorants the morning of surgery.  Please wear clean clothes to the hospital/surgery center.  FAILURE TO FOLLOW THESE INSTRUCTIONS MAY RESULT IN THE CANCELLATION OF YOUR SURGERY PATIENT SIGNATURE_________________________________  NURSE SIGNATURE__________________________________  ________________________________________________________________________

## 2017-01-24 ENCOUNTER — Encounter (HOSPITAL_COMMUNITY)
Admission: RE | Admit: 2017-01-24 | Discharge: 2017-01-24 | Disposition: A | Discharge status: Home / Self Care | Payer: Medicare HMO | Source: Ambulatory Visit | Attending: General Surgery | Admitting: General Surgery

## 2017-01-24 ENCOUNTER — Encounter (HOSPITAL_COMMUNITY): Payer: Self-pay

## 2017-01-24 DIAGNOSIS — K439 Ventral hernia without obstruction or gangrene: Secondary | ICD-10-CM | POA: Diagnosis not present

## 2017-01-24 DIAGNOSIS — Z01812 Encounter for preprocedural laboratory examination: Secondary | ICD-10-CM | POA: Insufficient documentation

## 2017-01-24 LAB — COMPREHENSIVE METABOLIC PANEL
ALT: 18 U/L (ref 14–54)
AST: 21 U/L (ref 15–41)
Albumin: 4.2 g/dL (ref 3.5–5.0)
Alkaline Phosphatase: 72 U/L (ref 38–126)
Anion gap: 9 (ref 5–15)
BILIRUBIN TOTAL: 0.8 mg/dL (ref 0.3–1.2)
BUN: 13 mg/dL (ref 6–20)
CHLORIDE: 102 mmol/L (ref 101–111)
CO2: 30 mmol/L (ref 22–32)
CREATININE: 0.98 mg/dL (ref 0.44–1.00)
Calcium: 10.1 mg/dL (ref 8.9–10.3)
GFR calc Af Amer: >60 mL/min (ref >60)
GFR, EST NON AFRICAN AMERICAN: 56 mL/min — AB (ref >60)
Glucose, Bld: 100 mg/dL — ABNORMAL HIGH (ref 65–99)
Potassium: 4.5 mmol/L (ref 3.5–5.1)
Sodium: 141 mmol/L (ref 135–145)
Total Protein: 7.2 g/dL (ref 6.5–8.1)

## 2017-01-24 LAB — CBC WITH DIFFERENTIAL/PLATELET
BASOS ABS: 0 10*3/uL (ref 0.0–0.1)
Basophils Relative: 0 %
Eosinophils Absolute: 0.1 10*3/uL (ref 0.0–0.7)
Eosinophils Relative: 2 %
HEMATOCRIT: 40.9 % (ref 36.0–46.0)
Hemoglobin: 13.4 g/dL (ref 12.0–15.0)
LYMPHS PCT: 44 %
Lymphs Abs: 3.2 10*3/uL (ref 0.7–4.0)
MCH: 29.4 pg (ref 26.0–34.0)
MCHC: 32.8 g/dL (ref 30.0–36.0)
MCV: 89.7 fL (ref 78.0–100.0)
Monocytes Absolute: 0.6 10*3/uL (ref 0.1–1.0)
Monocytes Relative: 8 %
NEUTROS ABS: 3.4 10*3/uL (ref 1.7–7.7)
NEUTROS PCT: 46 %
PLATELETS: 233 10*3/uL (ref 150–400)
RBC: 4.56 MIL/uL (ref 3.87–5.11)
RDW: 13.8 % (ref 11.5–15.5)
WBC: 7.3 10*3/uL (ref 4.0–10.5)

## 2017-01-31 ENCOUNTER — Observation Stay (HOSPITAL_COMMUNITY)
Admission: RE | Admit: 2017-01-31 | Discharge: 2017-02-02 | Disposition: A | Discharge status: Home / Self Care | Payer: Medicare HMO | Source: Ambulatory Visit | Attending: General Surgery | Admitting: General Surgery

## 2017-01-31 ENCOUNTER — Encounter (HOSPITAL_COMMUNITY)
Admission: RE | Disposition: A | Discharge status: Home / Self Care | Payer: Self-pay | Source: Ambulatory Visit | Attending: General Surgery

## 2017-01-31 ENCOUNTER — Ambulatory Visit (HOSPITAL_COMMUNITY): Payer: Medicare HMO | Admitting: Certified Registered Nurse Anesthetist

## 2017-01-31 ENCOUNTER — Encounter (HOSPITAL_COMMUNITY): Payer: Self-pay | Admitting: *Deleted

## 2017-01-31 DIAGNOSIS — K439 Ventral hernia without obstruction or gangrene: Secondary | ICD-10-CM | POA: Diagnosis present

## 2017-01-31 DIAGNOSIS — Z888 Allergy status to other drugs, medicaments and biological substances status: Secondary | ICD-10-CM | POA: Diagnosis not present

## 2017-01-31 DIAGNOSIS — Z882 Allergy status to sulfonamides status: Secondary | ICD-10-CM | POA: Diagnosis not present

## 2017-01-31 DIAGNOSIS — Z880 Allergy status to penicillin: Secondary | ICD-10-CM | POA: Insufficient documentation

## 2017-01-31 DIAGNOSIS — I480 Paroxysmal atrial fibrillation: Secondary | ICD-10-CM | POA: Diagnosis not present

## 2017-01-31 DIAGNOSIS — I5032 Chronic diastolic (congestive) heart failure: Secondary | ICD-10-CM | POA: Insufficient documentation

## 2017-01-31 DIAGNOSIS — Z87891 Personal history of nicotine dependence: Secondary | ICD-10-CM | POA: Insufficient documentation

## 2017-01-31 DIAGNOSIS — Z7982 Long term (current) use of aspirin: Secondary | ICD-10-CM | POA: Diagnosis not present

## 2017-01-31 DIAGNOSIS — Z803 Family history of malignant neoplasm of breast: Secondary | ICD-10-CM | POA: Insufficient documentation

## 2017-01-31 DIAGNOSIS — Z832 Family history of diseases of the blood and blood-forming organs and certain disorders involving the immune mechanism: Secondary | ICD-10-CM | POA: Insufficient documentation

## 2017-01-31 DIAGNOSIS — K5909 Other constipation: Secondary | ICD-10-CM | POA: Insufficient documentation

## 2017-01-31 DIAGNOSIS — Z8249 Family history of ischemic heart disease and other diseases of the circulatory system: Secondary | ICD-10-CM | POA: Insufficient documentation

## 2017-01-31 DIAGNOSIS — K567 Ileus, unspecified: Secondary | ICD-10-CM | POA: Diagnosis not present

## 2017-01-31 DIAGNOSIS — K432 Incisional hernia without obstruction or gangrene: Secondary | ICD-10-CM | POA: Diagnosis present

## 2017-01-31 DIAGNOSIS — F419 Anxiety disorder, unspecified: Secondary | ICD-10-CM | POA: Diagnosis not present

## 2017-01-31 DIAGNOSIS — Z79899 Other long term (current) drug therapy: Secondary | ICD-10-CM | POA: Insufficient documentation

## 2017-01-31 DIAGNOSIS — Z9049 Acquired absence of other specified parts of digestive tract: Secondary | ICD-10-CM | POA: Insufficient documentation

## 2017-01-31 DIAGNOSIS — Z8261 Family history of arthritis: Secondary | ICD-10-CM | POA: Insufficient documentation

## 2017-01-31 DIAGNOSIS — Z90721 Acquired absence of ovaries, unilateral: Secondary | ICD-10-CM | POA: Diagnosis not present

## 2017-01-31 DIAGNOSIS — M1990 Unspecified osteoarthritis, unspecified site: Secondary | ICD-10-CM | POA: Insufficient documentation

## 2017-01-31 DIAGNOSIS — Z8371 Family history of colonic polyps: Secondary | ICD-10-CM | POA: Insufficient documentation

## 2017-01-31 DIAGNOSIS — Z90710 Acquired absence of both cervix and uterus: Secondary | ICD-10-CM | POA: Insufficient documentation

## 2017-01-31 HISTORY — PX: INCISIONAL HERNIA REPAIR: SHX193

## 2017-01-31 HISTORY — PX: INSERTION OF MESH: SHX5868

## 2017-01-31 LAB — SURGICAL PCR SCREEN
MRSA, PCR: NEGATIVE
Staphylococcus aureus: NEGATIVE

## 2017-01-31 SURGERY — REPAIR, HERNIA, INCISIONAL, LAPAROSCOPIC
Anesthesia: General

## 2017-01-31 MED ORDER — METHOCARBAMOL 750 MG PO TABS: 750.0000 mg | ORAL_TABLET | Freq: Three times a day (TID) | ORAL | 2 refills | Status: DC

## 2017-01-31 MED ORDER — FENTANYL CITRATE (PF) 100 MCG/2ML IJ SOLN
INTRAMUSCULAR | Status: AC
  Administered 2017-01-31: 09:00:00
  Filled 2017-01-31: qty 2

## 2017-01-31 MED ORDER — POTASSIUM CHLORIDE CRYS ER 20 MEQ PO TBCR
20.0000 meq | EXTENDED_RELEASE_TABLET | Freq: Every day | ORAL | Status: DC
  Administered 2017-02-01 – 2017-02-02 (×2): 20 meq via ORAL
  Filled 2017-01-31 (×2): qty 1

## 2017-01-31 MED ORDER — OXYCODONE HCL 5 MG PO TABS: 5.0000 mg | ORAL_TABLET | Freq: Four times a day (QID) | ORAL | 0 refills | Status: DC | PRN

## 2017-01-31 MED ORDER — OXYCODONE HCL 5 MG PO TABS: 5.0000 mg | ORAL_TABLET | Freq: Once | ORAL | Status: DC | PRN

## 2017-01-31 MED ORDER — PROPOFOL 10 MG/ML IV BOLUS
INTRAVENOUS | Status: AC
  Filled 2017-01-31: qty 20

## 2017-01-31 MED ORDER — FENTANYL CITRATE (PF) 100 MCG/2ML IJ SOLN
25.0000 ug | INTRAMUSCULAR | Status: DC | PRN
  Administered 2017-01-31 (×2): 50 ug via INTRAVENOUS

## 2017-01-31 MED ORDER — SUGAMMADEX SODIUM 200 MG/2ML IV SOLN
INTRAVENOUS | Status: AC
  Filled 2017-01-31: qty 2

## 2017-01-31 MED ORDER — FENTANYL CITRATE (PF) 100 MCG/2ML IJ SOLN: 25.0000 ug | INTRAMUSCULAR | Status: DC | PRN

## 2017-01-31 MED ORDER — ONDANSETRON HCL 4 MG/2ML IJ SOLN: 4.0000 mg | Freq: Once | INTRAMUSCULAR | Status: DC | PRN

## 2017-01-31 MED ORDER — HYDROMORPHONE HCL-NACL 0.5-0.9 MG/ML-% IV SOSY
PREFILLED_SYRINGE | INTRAVENOUS | Status: AC
  Administered 2017-01-31: 10:00:00
  Filled 2017-01-31: qty 3

## 2017-01-31 MED ORDER — LIDOCAINE 2% (20 MG/ML) 5 ML SYRINGE
INTRAMUSCULAR | Status: DC | PRN
  Administered 2017-01-31: 80 mg via INTRAVENOUS

## 2017-01-31 MED ORDER — LACTATED RINGERS IV SOLN: INTRAVENOUS | Status: DC

## 2017-01-31 MED ORDER — CHLORHEXIDINE GLUCONATE CLOTH 2 % EX PADS
6.0000 | MEDICATED_PAD | Freq: Once | CUTANEOUS | Status: DC
  Administered 2017-01-31: 6 via TOPICAL

## 2017-01-31 MED ORDER — SUCCINYLCHOLINE CHLORIDE 200 MG/10ML IV SOSY
PREFILLED_SYRINGE | INTRAVENOUS | Status: AC
  Filled 2017-01-31: qty 10

## 2017-01-31 MED ORDER — PANTOPRAZOLE SODIUM 40 MG PO TBEC
40.0000 mg | DELAYED_RELEASE_TABLET | Freq: Every day | ORAL | Status: DC
  Administered 2017-02-01 – 2017-02-02 (×2): 40 mg via ORAL
  Filled 2017-01-31 (×2): qty 1

## 2017-01-31 MED ORDER — ONDANSETRON 4 MG PO TBDP: 4.0000 mg | ORAL_TABLET | Freq: Four times a day (QID) | ORAL | Status: DC | PRN

## 2017-01-31 MED ORDER — ONDANSETRON HCL 4 MG/2ML IJ SOLN
INTRAMUSCULAR | Status: AC
  Filled 2017-01-31: qty 2

## 2017-01-31 MED ORDER — ONDANSETRON HCL 4 MG PO TABS: 4.0000 mg | ORAL_TABLET | ORAL | 0 refills | Status: DC | PRN

## 2017-01-31 MED ORDER — OXYCODONE HCL 5 MG/5ML PO SOLN
5.0000 mg | Freq: Once | ORAL | Status: DC | PRN
  Filled 2017-01-31: qty 5

## 2017-01-31 MED ORDER — LIDOCAINE 2% (20 MG/ML) 5 ML SYRINGE
INTRAMUSCULAR | Status: AC
  Filled 2017-01-31: qty 5

## 2017-01-31 MED ORDER — OXYCODONE HCL 5 MG PO TABS
5.0000 mg | ORAL_TABLET | ORAL | Status: DC | PRN
  Administered 2017-01-31 – 2017-02-01 (×4): 5 mg via ORAL
  Filled 2017-01-31 (×4): qty 1

## 2017-01-31 MED ORDER — FENTANYL CITRATE (PF) 100 MCG/2ML IJ SOLN
INTRAMUSCULAR | Status: DC | PRN
  Administered 2017-01-31 (×4): 50 ug via INTRAVENOUS

## 2017-01-31 MED ORDER — BUPIVACAINE HCL (PF) 0.5 % IJ SOLN
INTRAMUSCULAR | Status: AC
  Filled 2017-01-31: qty 30

## 2017-01-31 MED ORDER — OXYCODONE HCL 5 MG/5ML PO SOLN: 5.0000 mg | Freq: Once | ORAL | Status: DC | PRN

## 2017-01-31 MED ORDER — MONTELUKAST SODIUM 10 MG PO TABS
10.0000 mg | ORAL_TABLET | Freq: Every day | ORAL | Status: DC
  Administered 2017-01-31 – 2017-02-01 (×2): 10 mg via ORAL
  Filled 2017-01-31 (×2): qty 1

## 2017-01-31 MED ORDER — SUCCINYLCHOLINE CHLORIDE 200 MG/10ML IV SOSY
PREFILLED_SYRINGE | INTRAVENOUS | Status: DC | PRN
  Administered 2017-01-31: 100 mg via INTRAVENOUS

## 2017-01-31 MED ORDER — ALBUTEROL SULFATE (2.5 MG/3ML) 0.083% IN NEBU: 2.5000 mg | INHALATION_SOLUTION | Freq: Four times a day (QID) | RESPIRATORY_TRACT | Status: DC | PRN

## 2017-01-31 MED ORDER — ROCURONIUM BROMIDE 50 MG/5ML IV SOSY
PREFILLED_SYRINGE | INTRAVENOUS | Status: AC
  Filled 2017-01-31: qty 5

## 2017-01-31 MED ORDER — PHENYLEPHRINE 40 MCG/ML (10ML) SYRINGE FOR IV PUSH (FOR BLOOD PRESSURE SUPPORT)
PREFILLED_SYRINGE | INTRAVENOUS | Status: DC | PRN
  Administered 2017-01-31: 40 ug via INTRAVENOUS
  Administered 2017-01-31 (×2): 80 ug via INTRAVENOUS
  Administered 2017-01-31: 40 ug via INTRAVENOUS

## 2017-01-31 MED ORDER — ACETAMINOPHEN 325 MG PO TABS: 650.0000 mg | ORAL_TABLET | Freq: Four times a day (QID) | ORAL | Status: DC | PRN

## 2017-01-31 MED ORDER — ENOXAPARIN SODIUM 40 MG/0.4ML ~~LOC~~ SOLN
40.0000 mg | SUBCUTANEOUS | Status: DC
  Administered 2017-02-01 – 2017-02-02 (×2): 40 mg via SUBCUTANEOUS
  Filled 2017-01-31 (×2): qty 0.4

## 2017-01-31 MED ORDER — HYDROMORPHONE HCL-NACL 0.5-0.9 MG/ML-% IV SOSY
0.2500 mg | PREFILLED_SYRINGE | INTRAVENOUS | Status: DC | PRN
  Administered 2017-01-31 (×3): 0.5 mg via INTRAVENOUS

## 2017-01-31 MED ORDER — MORPHINE SULFATE (PF) 2 MG/ML IV SOLN: 2.0000 mg | INTRAVENOUS | Status: DC | PRN

## 2017-01-31 MED ORDER — KCL IN DEXTROSE-NACL 20-5-0.9 MEQ/L-%-% IV SOLN
INTRAVENOUS | Status: DC
  Administered 2017-01-31 – 2017-02-01 (×2): via INTRAVENOUS
  Filled 2017-01-31 (×3): qty 1000

## 2017-01-31 MED ORDER — METHOCARBAMOL 1000 MG/10ML IJ SOLN
500.0000 mg | Freq: Three times a day (TID) | INTRAVENOUS | Status: DC
  Administered 2017-01-31 – 2017-02-02 (×6): 500 mg via INTRAVENOUS
  Filled 2017-01-31 (×4): qty 550
  Filled 2017-01-31: qty 5
  Filled 2017-01-31 (×2): qty 550

## 2017-01-31 MED ORDER — DEXAMETHASONE SODIUM PHOSPHATE 10 MG/ML IJ SOLN
INTRAMUSCULAR | Status: DC | PRN
  Administered 2017-01-31: 10 mg via INTRAVENOUS

## 2017-01-31 MED ORDER — BUPIVACAINE HCL (PF) 0.5 % IJ SOLN
INTRAMUSCULAR | Status: DC | PRN
  Administered 2017-01-31: 9 mL

## 2017-01-31 MED ORDER — DEXAMETHASONE SODIUM PHOSPHATE 10 MG/ML IJ SOLN
INTRAMUSCULAR | Status: AC
  Filled 2017-01-31: qty 1

## 2017-01-31 MED ORDER — ROCURONIUM BROMIDE 50 MG/5ML IV SOSY
PREFILLED_SYRINGE | INTRAVENOUS | Status: DC | PRN
  Administered 2017-01-31: 40 mg via INTRAVENOUS

## 2017-01-31 MED ORDER — FUROSEMIDE 40 MG PO TABS
40.0000 mg | ORAL_TABLET | Freq: Every day | ORAL | Status: DC
  Administered 2017-02-01 – 2017-02-02 (×2): 40 mg via ORAL
  Filled 2017-01-31 (×2): qty 1

## 2017-01-31 MED ORDER — LACTATED RINGERS IV SOLN
INTRAVENOUS | Status: DC | PRN
  Administered 2017-01-31 (×2): via INTRAVENOUS

## 2017-01-31 MED ORDER — CEFAZOLIN SODIUM-DEXTROSE 2-4 GM/100ML-% IV SOLN
2.0000 g | Freq: Three times a day (TID) | INTRAVENOUS | Status: AC
  Administered 2017-01-31: 2 g via INTRAVENOUS
  Filled 2017-01-31: qty 100

## 2017-01-31 MED ORDER — FENTANYL CITRATE (PF) 250 MCG/5ML IJ SOLN
INTRAMUSCULAR | Status: AC
  Filled 2017-01-31: qty 5

## 2017-01-31 MED ORDER — CEFAZOLIN SODIUM-DEXTROSE 2-4 GM/100ML-% IV SOLN
INTRAVENOUS | Status: AC
  Filled 2017-01-31: qty 100

## 2017-01-31 MED ORDER — ALPRAZOLAM 0.25 MG PO TABS: 0.2500 mg | ORAL_TABLET | Freq: Two times a day (BID) | ORAL | Status: DC | PRN

## 2017-01-31 MED ORDER — DOCUSATE SODIUM 100 MG PO CAPS
100.0000 mg | ORAL_CAPSULE | Freq: Two times a day (BID) | ORAL | Status: DC
  Administered 2017-01-31 – 2017-02-02 (×4): 100 mg via ORAL
  Filled 2017-01-31 (×4): qty 1

## 2017-01-31 MED ORDER — ONDANSETRON HCL 4 MG/2ML IJ SOLN
4.0000 mg | INTRAMUSCULAR | Status: DC | PRN
  Administered 2017-01-31: 4 mg via INTRAVENOUS
  Filled 2017-01-31: qty 2

## 2017-01-31 MED ORDER — PROPOFOL 10 MG/ML IV BOLUS
INTRAVENOUS | Status: DC | PRN
  Administered 2017-01-31: 130 mg via INTRAVENOUS

## 2017-01-31 MED ORDER — CEFAZOLIN SODIUM-DEXTROSE 2-4 GM/100ML-% IV SOLN
2.0000 g | INTRAVENOUS | Status: AC
  Administered 2017-01-31: 2 g via INTRAVENOUS

## 2017-01-31 MED ORDER — SUGAMMADEX SODIUM 200 MG/2ML IV SOLN
INTRAVENOUS | Status: DC | PRN
  Administered 2017-01-31: 200 mg via INTRAVENOUS

## 2017-01-31 SURGICAL SUPPLY — 38 items
APL SKNCLS STERI-STRIP NONHPOA (GAUZE/BANDAGES/DRESSINGS) ×1
BANDAGE ADH SHEER 1  50/CT (GAUZE/BANDAGES/DRESSINGS) ×12 IMPLANT
BENZOIN TINCTURE PRP APPL 2/3 (GAUZE/BANDAGES/DRESSINGS) ×3 IMPLANT
BINDER ABDOMINAL 12 ML 46-62 (SOFTGOODS) IMPLANT
CABLE HIGH FREQUENCY MONO STRZ (ELECTRODE) ×3 IMPLANT
CLOSURE WOUND 1/2 X4 (GAUZE/BANDAGES/DRESSINGS) ×1
DECANTER SPIKE VIAL GLASS SM (MISCELLANEOUS) ×3 IMPLANT
DEVICE TROCAR PUNCTURE CLOSURE (ENDOMECHANICALS) ×3 IMPLANT
DISSECTOR BLUNT TIP ENDO 5MM (MISCELLANEOUS) IMPLANT
DRAPE INCISE IOBAN 66X45 STRL (DRAPES) ×3 IMPLANT
DRSG TEGADERM 2-3/8X2-3/4 SM (GAUZE/BANDAGES/DRESSINGS) IMPLANT
ELECT REM PT RETURN 15FT ADLT (MISCELLANEOUS) ×3 IMPLANT
GAUZE SPONGE 2X2 8PLY STRL LF (GAUZE/BANDAGES/DRESSINGS) ×1 IMPLANT
GLOVE ECLIPSE 8.0 STRL XLNG CF (GLOVE) ×3 IMPLANT
GLOVE INDICATOR 8.0 STRL GRN (GLOVE) ×3 IMPLANT
GOWN STRL REUS W/TWL XL LVL3 (GOWN DISPOSABLE) ×9 IMPLANT
KIT BASIN OR (CUSTOM PROCEDURE TRAY) ×3 IMPLANT
MARKER SKIN DUAL TIP RULER LAB (MISCELLANEOUS) ×3 IMPLANT
MESH VENTRALIGHT ST 8X10 (Mesh General) ×2 IMPLANT
NDL SPNL 22GX3.5 QUINCKE BK (NEEDLE) ×1 IMPLANT
NEEDLE SPNL 22GX3.5 QUINCKE BK (NEEDLE) ×3 IMPLANT
PAD POSITIONING PINK XL (MISCELLANEOUS) ×3 IMPLANT
SCISSORS LAP 5X35 DISP (ENDOMECHANICALS) ×3 IMPLANT
SET IRRIG TUBING LAPAROSCOPIC (IRRIGATION / IRRIGATOR) IMPLANT
SHEARS HARMONIC ACE PLUS 36CM (ENDOMECHANICALS) IMPLANT
SLEEVE XCEL OPT CAN 5 100 (ENDOMECHANICALS) ×3 IMPLANT
SPONGE GAUZE 2X2 STER 10/PKG (GAUZE/BANDAGES/DRESSINGS) ×2
STRIP CLOSURE SKIN 1/2X4 (GAUZE/BANDAGES/DRESSINGS) ×2 IMPLANT
SUT MNCRL AB 4-0 PS2 18 (SUTURE) ×3 IMPLANT
SUT NOVA NAB DX-16 0-1 5-0 T12 (SUTURE) ×3 IMPLANT
TACKER 5MM HERNIA 3.5CML NAB (ENDOMECHANICALS) ×7 IMPLANT
TOWEL OR 17X26 10 PK STRL BLUE (TOWEL DISPOSABLE) ×3 IMPLANT
TOWEL OR NON WOVEN STRL DISP B (DISPOSABLE) ×3 IMPLANT
TRAY LAPAROSCOPIC (CUSTOM PROCEDURE TRAY) ×3 IMPLANT
TROCAR BLADELESS OPT 5 100 (ENDOMECHANICALS) ×3 IMPLANT
TROCAR XCEL BLUNT TIP 100MML (ENDOMECHANICALS) IMPLANT
TROCAR XCEL NON-BLD 11X100MML (ENDOMECHANICALS) IMPLANT
TUBING INSUF HEATED (TUBING) ×3 IMPLANT

## 2017-01-31 NOTE — Interval H&P Note (Signed)
History and Physical Interval Note:  01/31/2017 7:21 AM  Katherine Floyd  has presented today for surgery, with the diagnosis of ventral incisional hernia.  I communicated with her cardiologist and he did not feel she needed a preop cardiac evaluation.  The various methods of treatment have been discussed with the patient and family. After consideration of risks, benefits and other options for treatment, the patient has consented to  Procedure(s): [place] (N/A) INSERTION OF MESH (N/A) as a surgical intervention .  The patient's history has been reviewed, patient examined, no change in status, stable for surgery.  I have reviewed the patient's chart and labs.  Questions were answered to the patient's satisfaction.     Katherine Floyd

## 2017-01-31 NOTE — Op Note (Signed)
OPERATIVE NOTE:  Laparoscopic repair of ventral incisional hernia with mesh  PREOPERATIVE DX:  Ventral incisional hernia  POSTOPERATIVE DX:  Same  PROCEDURE:   Laparoscopic repair of ventral hernia with mesh Katherine Floyd)        Surgeon: [name]   Assistants: Romana Juniper, M.D.  Anesthesia: General endotracheal anesthesia  Indications:   This is a 71 year old female 1 year and a laparoscopic-assisted partial colectomy in 2013. She is developed an incisional hernia through the limited midline epigastric extraction site incision. It is symptomatic and getting larger. She now presents for repair.    Procedure Detail:  She was brought to the operating room, placed supine on the operating table, and a general anesthetic was given.An orogastric tube was inserted.  The abdominal wall was widely sterilely prepped and draped. A timeout was performed.  She was placed in slight reverse Trendelenburg position. A 5 mm incision was made in the left subcostal area. Using a 5 mm Optiview trocar and laparoscope, access was gained into the peritoneal cavity. A pneumoperitoneum was created by insufflation of carbon dioxide gas. The laparoscope was introduced and there is no evidence of organ injury or bleeding under the trocar. The epigastric incisional hernia was identified containing omentum that was viable.   A 5 mm trocar was placed in the left lower quadrant.  An 11 mm trocar was placed in the right upper quadrant. A 5 mm trocar was placed in the right mid lateral abdomen.  Using blunt dissection and the Harmonic scalpel the omentum was reduced from the hernia. Omental adhesions to the abdominal wall were mobilized using the harmonic scalpel and blunt dissection.  Using blunt dissection and the Harmonic scalpel, the abdominal wall was cleared of adhesions for approximate 5 cm around the hernia. The peripheries of the hernia was marked and I measured 4 cm away from this. A piece of Ventralite  mesh was brought into the field. It was cut into a 19 cm long by 15 cm wide piece which would allow for good coverage of the hernia with adequate overlap. The rough side was marked. Four anchoring sutures of #1 Novofil were placed around the mesh.  The mesh was hydrated. The mesh was then placed into the peritoneal cavity through the 11 mm trocar. It was deployed with the nonadherent side facing the viscera. Four wounds were placed in the abdominal wall to coincide with the anchoring sutures. All anchoring sutures were then brought up across the fascial bridge using a suture passer and then they were tied down anchoring the mesh to the abdominal wall. The mesh was then further anchored to the abdominal wall with an outer circular rim and inner circular rim of spiral tacks. This provided for adequate coverage and good overlap of the hernia defect.  A four-quadrant and central inspection was then performed. There was no evidence of bleeding or organ injury.  The pneumoperitoneum was released and the remaining trocars were removed.  All skin incisions were closed with 4-0 Monocryl subcuticular stitches followed by Steri-Strips and sterile dressings.  She tolerated the procedure well, without any apparent complications, and was taken to the recovery room in satisfactory condition.  Estimated Blood Loss:  Less than 100 ml         Drains: none        Complications:  * No complications entered in OR log *         Disposition: PACU - hemodynamically stable.         Condition:  stable

## 2017-01-31 NOTE — H&P (View-Only) (Signed)
Katherine Floyd 12/31/2016 9:18 AM Location: [facility] Patient #: [id number] DOB: [birth year] Married / Language: English / Race: White Female   History of Present Illness Katherine Hollingshead MD; 12/31/2016 12:29 PM) The patient is a 71 year old female.  Note:She is referred by Katherine Matter, PA for consultation regarding an abdominal wall hernia. She underwent a laparoscopic-assisted partial colectomy in 2013. She has noticed a bulge at the superior aspect of the limited upper midline incision that is become larger and uncomfortable at times. No obstructive symptoms. She does have chronic constipation and takes intermittent MiraLAX for this. She is here with her husband for further evaluation. She does have allergies and sometimes has to take inhalers. She also has a history of paroxysmal atrial fibrillation and chronic diastolic heart failure. She has been followed by Dr. Daneen Floyd for this.  Past Surgical History Katherine Floyd, [place]; 12/31/2016 9:18 AM) Cataract Surgery  Left. Foot Surgery  Bilateral. Hysterectomy (not due to cancer) - Complete  Hysterectomy (not due to cancer) - Partial  Knee Surgery  Left. Oral Surgery  Resection of Small Bowel  Spinal Surgery - Lower Back  Tonsillectomy   Diagnostic Studies History Katherine Floyd, [place]; 12/31/2016 9:18 AM) Mammogram  1-3 years ago Pap Smear  >5 years ago  Allergies Katherine Floyd, RMA; 12/31/2016 9:23 AM) PredniSONE *CORTICOSTEROIDS*  Rofecoxib *ANALGESICS - ANTI-INFLAMMATORY*  Sulfamethoxazole *SULFONAMIDES*  Sulfur (Antiseborrheic) *DERMATOLOGICALS*  Ampicillin *PENICILLINS*   Medication History Katherine Floyd, RMA; 12/31/2016 9:25 AM) ALPRAZolam (0.5MG  Tablet, Oral) Active. Atorvastatin Calcium (10MG  Tablet, Oral) Active. Fluticasone Propionate (50MCG/ACT Suspension, Nasal) Active. Furosemide (40MG  Tablet, Oral) Active. Montelukast Sodium (10MG  Tablet, Oral) Active. Potassium Chloride  Crys ER (20MEQ Tablet ER, Oral) Active. Symbicort (160-4.5MCG/ACT Aerosol, Inhalation) Active. Cetirizine HCl (10MG  Tablet, Oral daily) Active. Colace (100MG  Capsule, Oral) Active.  Social History Katherine Floyd, [place]; 12/31/2016 9:18 AM) Alcohol use  Remotely quit alcohol use. Caffeine use  Coffee, Tea. No drug use  Tobacco use  Former smoker.  Family History Katherine Floyd, [place]; 12/31/2016 9:18 AM) Arthritis  Father, Mother. Bleeding disorder  Mother. Breast Cancer  Mother. Colon Polyps  Mother. Heart Disease  Mother. Heart disease in female family member before age 54   Pregnancy / Birth History Katherine Floyd, [place]; 12/31/2016 9:18 AM) Age at menarche  37 years. Gravida  0  Other Problems Katherine Floyd, [place]; 12/31/2016 9:18 AM) Anxiety Disorder  Arthritis  Asthma  Back Pain  Bladder Problems  Congestive Heart Failure  Gastroesophageal Reflux Disease  Hemorrhoids  Oophorectomy     Review of Systems Katherine Floyd RMA; 12/31/2016 9:18 AM) General Present- Chills, Fatigue and Night Sweats. Not Present- Appetite Loss, Fever, Weight Gain and Weight Loss. Skin Present- Change in Wart/Mole and Dryness. Not Present- Hives, Jaundice, New Lesions, Non-Healing Wounds, Rash and Ulcer. HEENT Present- Earache and Hoarseness. Not Present- Hearing Loss, Nose Bleed, Oral Ulcers, Ringing in the Ears, Seasonal Allergies, Sinus Pain, Sore Throat, Visual Disturbances, Wears glasses/contact lenses and Yellow Eyes. Respiratory Present- Wheezing. Not Present- Bloody sputum, Chronic Cough, Difficulty Breathing and Snoring. Cardiovascular Present- Shortness of Breath. Not Present- Chest Pain, Difficulty Breathing Lying Down, Leg Cramps, Palpitations, Rapid Heart Rate and Swelling of Extremities. Gastrointestinal Present- Abdominal Pain, Bloating, Constipation, Excessive gas and Gets full quickly at meals. Not Present- Bloody Stool, Change in Bowel Habits, Chronic diarrhea,  Difficulty Swallowing, Hemorrhoids, Indigestion, Nausea, Rectal Pain and Vomiting. Musculoskeletal Present- Back Pain, Joint Pain, Muscle Pain and Swelling of Extremities. Not Present- Joint Stiffness and  Muscle Weakness. Neurological Present- Headaches. Not Present- Decreased Memory, Fainting, Numbness, Seizures, Tingling, Tremor, Trouble walking and Weakness. Psychiatric Present- Change in Sleep Pattern. Not Present- Anxiety, Bipolar, Depression, Fearful and Frequent crying. Endocrine Present- Hot flashes. Not Present- Cold Intolerance, Excessive Hunger, Hair Changes, Heat Intolerance and New Diabetes. Hematology Present- Easy Bruising. Not Present- Blood Thinners, Excessive bleeding, Gland problems, HIV and Persistent Infections.   Physical Exam Katherine Hollingshead MD; 12/31/2016 10:16 AM) The physical exam findings are as follows: Note:GENERAL APPEARANCE: Overweight female in NAD. Pleasant and cooperative.  EARS, NOSE, MOUTH THROAT: [place]/AT external ears: no lesions or deformities external nose: no lesions or deformities hearing: grossly normal lips: moist, no deformities EYES external: conjunctiva, lids, sclerae normal pupils: equal, round glasses: no   CV ascultation: RRR, no murmur extremity edema: no extremity varicosities: no   RESP/CHEST auscultation: breath sounds equal and clear respiratory effort: normal  GASTROINTESTINAL abdomen: Soft, non-tender, non-distended, no masses liver and spleen: not enlarged. hernia: epigastric, reducible at superior aspect of scar scar: epigastric  MUSCULOSKELETAL station and gait: normal deformities: none instability: none  SKIN jaundice: none  NEUROLOGIC speech: normal  PSYCHIATRIC alertness and orientation: normal mood/affect/behavior: normal judgement and insight: normal  Assessment & Plan Katherine Hollingshead MD; 12/31/2016 12:30 PM) INCISIONAL HERNIA OF ANTERIOR ABDOMINAL WALL WITHOUT OBSTRUCTION OR GANGRENE  (K43.2) Impression: This is getting larger and more symptomatic. She is interested in repair.  Plan: We discussed laparoscopic incisional hernia repair with mesh. I have discussed the procedure, risks, and aftercare. Risks include but are not limited to bleeding, infection, wound healing problems, anesthesia, recurrence, accidental injury to intra-abdominal organs-such as intestine, liver, spleen, bladder, etc. We also discussed the rare complication of mesh rejection. All questions were answered.  Jackolyn Confer, M.D.

## 2017-01-31 NOTE — Anesthesia Procedure Notes (Signed)
Procedure Name: Intubation Date/Time: 01/31/2017 7:41 AM Performed by: Montel Clock Pre-anesthesia Checklist: Patient identified, Emergency Drugs available, Suction available, Patient being monitored and Timeout performed Patient Re-evaluated:Patient Re-evaluated prior to induction Oxygen Delivery Method: Circle system utilized Preoxygenation: Pre-oxygenation with 100% oxygen Induction Type: IV induction Ventilation: Mask ventilation without difficulty and Oral airway inserted - appropriate to patient size Laryngoscope Size: Mac and 3 Grade View: Grade II Tube type: Oral Tube size: 7.0 mm Number of attempts: 1 Airway Equipment and Method: Stylet Placement Confirmation: ETT inserted through vocal cords under direct vision,  positive ETCO2 and breath sounds checked- equal and bilateral Secured at: 20 cm Tube secured with: Tape Dental Injury: Teeth and Oropharynx as per pre-operative assessment  Comments: Grade 2 view with downward laryngeal pressure.

## 2017-01-31 NOTE — Anesthesia Postprocedure Evaluation (Signed)
Anesthesia Post Note  Patient: Katherine Floyd  Procedure(s) Performed: LAPAROSCOPIC INCISIONAL HERNIA REPAIR WITH MESH (N/A ) INSERTION OF MESH (N/A )     Patient location during evaluation: PACU Anesthesia Type: General Level of consciousness: awake, awake and alert and oriented Pain management: pain level controlled Vital Signs Assessment: post-procedure vital signs reviewed and stable Respiratory status: spontaneous breathing, nonlabored ventilation and respiratory function stable Cardiovascular status: blood pressure returned to baseline Anesthetic complications: no    Last Vitals:  Vitals:   01/31/17 1400 01/31/17 1500  BP: 132/74 (!) 141/65  Pulse: 81 74  Resp: 14 14  Temp: 37.1 C 37.3 C  SpO2: 94% 96%    Last Pain:  Vitals:   01/31/17 1541  TempSrc:   PainSc: 2                  Shellby Schlink COKER

## 2017-01-31 NOTE — Discharge Instructions (Signed)
[facility] _______[facility]   VENTRAL (ABDOMINAL WALL) HERNIA REPAIR: POST OP INSTRUCTIONS  Always review your discharge instruction sheet given to you by the facility where your surgery was performed. IF YOU HAVE DISABILITY OR FAMILY LEAVE FORMS, YOU MUST BRING THEM TO THE OFFICE FOR PROCESSING.   DO NOT GIVE THEM TO YOUR DOCTOR.  1. A  prescription for pain medication may be given to you upon discharge.  Take your pain medication as prescribed, if needed.  If narcotic pain medicine is not needed, then you may take acetaminophen (Tylenol) or ibuprofen (Advil) as needed. 2. Take your usually prescribed medications unless otherwise directed. 3. If you need a refill on your pain medication, please contact your pharmacy.  They will contact our office to request authorization. Prescriptions will not be filled after 5 pm or on week-ends. 4. You should follow a light diet the first 24 hours after arrival home, such as soup and crackers, etc.  Be sure to include lots of fluids daily.  Resume your normal diet the day after surgery. 5. Most patients will experience some swelling and bruising around the hernia repair site.  Ice packs and reclining will help.  Swelling and bruising can take many days to resolve.  6. It is common to experience some constipation if taking pain medication after surgery.  Increasing fluid intake and taking a stool softener (such as Colace) will usually help or prevent this problem from occurring.  A mild laxative (Milk of Magnesia or Miralax) should be taken according to package directions if there are no bowel movements after 48 hours. 7. Unless discharge instructions indicate otherwise, you may remove your bandages 72 hours after surgery, and you may shower at that time.  You may have steri-strips (small skin tapes) in place directly over the incision.  These strips should be left on the skin.  If your surgeon used skin glue on the incision, you may shower in 24 hours.   The glue will flake off over the next 2-3 weeks.  Any sutures or staples will be removed at the office during your follow-up visit. 8. ACTIVITIES:  You may resume regular (light) daily activities beginning the next day--such as daily self-care, walking, climbing stairs--gradually increasing activities as tolerated.  You may have sexual intercourse when it is comfortable.  Refrain from any heavy lifting or straining-nothing over 10 pounds for 6 weeks.  a. You may drive when you are no longer taking prescription pain medication, you can comfortably wear a seatbelt, and you can safely maneuver your car and apply brakes. b. RETURN TO WORK:  Desk work/Light work in 2 weeks if pain-free, full duty in 6 weeks._________________________________________________________ 9. You should see your doctor in the office for a follow-up appointment approximately 2-3 weeks after your surgery.  Make sure that you call for this appointment within a day or two after you arrive home to insure a convenient appointment time. 10. OTHER INSTRUCTIONS:  ___Wear abdominal binder.  Use the lift-chair.______________________________________________________________________________________________________________________________________________________________________________________  WHEN TO CALL YOUR DOCTOR: 1. Fever over 101.0 2. Inability to urinate 3. Nausea and/or vomiting 4. Extreme swelling or bruising 5. Continued bleeding from incision. 6. Increased pain, redness, or drainage from the incision  The clinic staff is available to answer your questions during regular business hours.  Please dont hesitate to call and ask to speak to one of the nurses for clinical concerns.  If you have a medical emergency, go to the nearest emergency room or call 911.  A  surgeon from [facility] is always on call at the hospital   [address], [place], [place], [place]  [address] ?  [address] [place], [place], [place] [phone] ? [phone] ? FAX [phone] Web site: [email or web address]

## 2017-01-31 NOTE — Transfer of Care (Signed)
Immediate Anesthesia Transfer of Care Note  Patient: Katherine Floyd  Procedure(s) Performed: Procedure(s): LAPAROSCOPIC INCISIONAL HERNIA REPAIR WITH MESH (N/A) INSERTION OF MESH (N/A)  Patient Location: PACU  Anesthesia Type:General  Level of Consciousness:  alert, patient cooperative and responds to stimulation  Airway & Oxygen Therapy:Patient Spontanous Breathing and Patient connected to face mask oxgen  Post-op Assessment:  Report given to PACU RN and Post -op Vital signs reviewed and stable  Post vital signs:  Reviewed and stable  Last Vitals:  Vitals:   01/31/17 0516  BP: (!) 141/78  Pulse: 71  Resp: 18  Temp: 36.6 C  SpO2: 46%    Complications: No apparent anesthesia complications

## 2017-01-31 NOTE — Anesthesia Preprocedure Evaluation (Addendum)
Anesthesia Evaluation  Patient identified by MRN, date of birth, ID band Patient awake    Reviewed: Allergy & Precautions, NPO status , Patient's Chart, lab work & pertinent test results  Airway Mallampati: II  TM Distance: >3 FB Neck ROM: Full    Dental  (+) Teeth Intact, Dental Advisory Given   Pulmonary former smoker,    breath sounds clear to auscultation       Cardiovascular  Rhythm:Regular Rate:Normal     Neuro/Psych    GI/Hepatic   Endo/Other    Renal/GU      Musculoskeletal   Abdominal   Peds  Hematology   Anesthesia Other Findings   Reproductive/Obstetrics                             Anesthesia Physical Anesthesia Plan  ASA: III  Anesthesia Plan: General   Post-op Pain Management:    Induction: Intravenous  PONV Risk Score and Plan: Ondansetron and Dexamethasone  Airway Management Planned: Oral ETT  Additional Equipment:   Intra-op Plan:   Post-operative Plan: Extubation in OR  Informed Consent: I have reviewed the patients History and Physical, chart, labs and discussed the procedure including the risks, benefits and alternatives for the proposed anesthesia with the patient or authorized representative who has indicated his/her understanding and acceptance.   Dental advisory given  Plan Discussed with: CRNA and Anesthesiologist  Anesthesia Plan Comments:         Anesthesia Quick Evaluation  

## 2017-02-01 DIAGNOSIS — K432 Incisional hernia without obstruction or gangrene: Secondary | ICD-10-CM | POA: Diagnosis not present

## 2017-02-01 MED ORDER — CALCIUM CARBONATE ANTACID 500 MG PO CHEW
400.0000 mg | CHEWABLE_TABLET | Freq: Four times a day (QID) | ORAL | Status: DC | PRN
Start: 2017-02-01 — End: 2017-02-02

## 2017-02-01 NOTE — Progress Notes (Signed)
Verbal order from MD [facility] to place order for Tums for patient as needed, order entered Neta Mends RN 12:50 PM 02-01-2017

## 2017-02-01 NOTE — Progress Notes (Signed)
1 Day Post-Op   Subjective/Chief Complaint: Belching a fair amount.  No flatus yet.  Has been walking, 7 laps already.    Objective: Vital signs in last 24 hours: Temp:  [97.7 F (36.5 C)-99.2 F (37.3 C)] 97.8 F (36.6 C) (10/06 0510) Pulse Rate:  [63-81] 63 (10/06 0510) Resp:  [12-18] 18 (10/06 0510) BP: (117-153)/(60-78) 121/60 (10/06 0510) SpO2:  [93 %-100 %] 100 % (10/06 0510) Last BM Date: 01/30/17  Intake/Output from previous day: 10/05 0701 - 10/06 0700 In: 4567.5 [P.O.:2080; I.V.:2222.5; IV Piggyback:265] Out: 1925 [PFXTK:2409; Blood:5] Intake/Output this shift: No intake/output data recorded.  General appearance: alert, cooperative and no distress Resp: breathing comfortably GI: soft, binder in place.  incision c/d/i.  mild distention. Extremities: extremities normal, atraumatic, no cyanosis or edema  Lab Results:  No results for input(s): WBC, HGB, HCT, PLT in the last 72 hours. BMET No results for input(s): NA, K, CL, CO2, GLUCOSE, BUN, CREATININE, CALCIUM in the last 72 hours. PT/INR No results for input(s): LABPROT, INR in the last 72 hours. ABG No results for input(s): PHART, HCO3 in the last 72 hours.  Invalid input(s): PCO2, PO2  Studies/Results: No results found.  Anti-infectives: Anti-infectives    Start     Dose/Rate Route Frequency Ordered Stop   01/31/17 1400  ceFAZolin (ANCEF) IVPB 2g/100 mL premix     2 g 200 mL/hr over 30 Minutes Intravenous Every 8 hours 01/31/17 1114 01/31/17 1537   01/31/17 0702  ceFAZolin (ANCEF) 2-4 GM/100ML-% IVPB    Comments:  Katherine Floyd   : cabinet override      01/31/17 0702 01/31/17 0745   01/31/17 0515  ceFAZolin (ANCEF) IVPB 2g/100 mL premix     2 g 200 mL/hr over 30 Minutes Intravenous On call to O.R. 01/31/17 0515 01/31/17 0750      Assessment/Plan: s/p Procedure(s): LAPAROSCOPIC INCISIONAL HERNIA REPAIR WITH MESH (N/A) INSERTION OF MESH (N/A) mild ileus, not unexpected.  Would keep until  tomorrow since no flatus and having a lot of belching.   LOS: 0 days    Katherine Floyd 02/01/2017

## 2017-02-02 DIAGNOSIS — K432 Incisional hernia without obstruction or gangrene: Secondary | ICD-10-CM | POA: Diagnosis not present

## 2017-02-02 NOTE — Progress Notes (Signed)
IV discontinued. No foley present. Voiding qs. Discharge instructions discussed with patient until no further questions ask. Able to answer questions about when to call MD , Lifting restrictions, and wound care. Has a follow up appointment in place. Discharged via wheelchair to private vehicle with husband

## 2017-02-02 NOTE — Discharge Summary (Signed)
Physician Discharge Summary  Patient ID: RAYCHELLE HUDMAN MRN: [id number] DOB/AGE: [birth year] 71 y.o.  Admit date: 01/31/2017 Discharge date: 02/02/2017  Admission Diagnoses: incisional hernia  Discharge Diagnoses:  Active Problems:   Ventral hernia without obstruction or gangrene   Discharged Condition: good  Hospital Course: She was admitted for observation following laparoscopic ventral hernia repair. On POD 1 she had some belching and reflux but pain was well controlled and she was walking the halls without issue. This improved over the course of the day and on POD 2 she was tolerating PO and starting to pass some flatus. She was deemed stable for discharge.  Consults: None  Significant Diagnostic Studies: see epic  Treatments: surgery: laparoscopic ventral hernia repair  Discharge Exam: Blood pressure 139/70, pulse 75, temperature 98.5 F (36.9 C), temperature source Oral, resp. rate 16, height 5' 1.25" (1.556 m), weight 74.4 kg (164 lb), SpO2 95 %. Alert and well appearing Unlabored respirations, clear bilaterally Regular rate and rhythm Abdomen soft, appropriately tender, incisions healing well without any signs of infection  Disposition: 01-Home or Self Care   Allergies as of 02/02/2017      Reactions   Prednisone Other (See Comments)   Patient states that 20 mgs of prednisone feel loopy.   Rofecoxib Other (See Comments)   Unknown   Sulfur Itching   Ampicillin Itching, Rash, Other (See Comments)   Unknown      Medication List    TAKE these medications   acetaminophen 650 MG CR tablet Commonly known as:  TYLENOL Take 1,300 mg by mouth every 8 (eight) hours as needed for pain.   albuterol (2.5 MG/3ML) 0.083% nebulizer solution Commonly known as:  PROVENTIL Take 2.5 mg by nebulization every 6 (six) hours as needed for wheezing or shortness of breath.   ALPRAZolam 0.5 MG tablet Commonly known as:  XANAX Take 0.25 mg by mouth 2 (two) times daily as needed  for anxiety. Anxiety   aspirin EC 81 MG tablet Take 1 tablet (81 mg total) by mouth daily.   atorvastatin 10 MG tablet Commonly known as:  LIPITOR TAKE 1 TABLET BY MOUTH EVERY NIGHT AT BEDTIME   B-12-SL SL Place 1,500 mcg under the tongue daily.   CALCIUM-CARB 600 + D PO Take 2 tablets by mouth daily.   carboxymethylcellulose 0.5 % Soln Commonly known as:  REFRESH PLUS Place 1 drop into both eyes 2 (two) times daily as needed. Dry eyes   cetirizine 10 MG tablet Commonly known as:  ZYRTEC Take 10 mg by mouth daily.   docusate sodium 100 MG capsule Commonly known as:  COLACE Take 100 mg by mouth 2 (two) times daily.   fluticasone 50 MCG/ACT nasal spray Commonly known as:  FLONASE Place 2 sprays into both nostrils 2 (two) times daily as needed for allergies.   furosemide 40 MG tablet Commonly known as:  LASIX TAKE 1 TABLET(40 MG) BY MOUTH DAILY   Lutein 6 MG Caps Take 6 mg by mouth daily.   methocarbamol 750 MG tablet Commonly known as:  ROBAXIN Take 1 tablet (750 mg total) by mouth 3 (three) times daily.   montelukast 10 MG tablet Commonly known as:  SINGULAIR Take 10 mg by mouth at bedtime.   multivitamin with minerals tablet Take 1 tablet by mouth daily.   omeprazole 20 MG capsule Commonly known as:  PRILOSEC Take 20 mg by mouth daily.   ondansetron 4 MG tablet Commonly known as:  ZOFRAN Take 1 tablet (4 mg  total) by mouth every 4 (four) hours as needed for nausea or vomiting.   oxyCODONE 5 MG immediate release tablet Commonly known as:  Oxy IR/ROXICODONE Take 1 tablet (5 mg total) by mouth every 6 (six) hours as needed for moderate pain, severe pain or breakthrough pain.   potassium chloride SA 20 MEQ tablet Commonly known as:  K-DUR,KLOR-CON TAKE 1 TABLET BY MOUTH EVERY DAY   SYMBICORT 160-4.5 MCG/ACT inhaler Generic drug:  budesonide-formoterol Inhale 2 puffs into the lungs 2 (two) times daily.   vitamin E 400 UNIT capsule Take 400 Units by  mouth daily.        Signed: Clovis Riley 02/02/2017, 9:56 AM

## 2017-09-09 ENCOUNTER — Other Ambulatory Visit: Payer: Self-pay | Admitting: *Deleted

## 2017-09-09 MED ORDER — ATORVASTATIN CALCIUM 10 MG PO TABS: 10.0000 mg | ORAL_TABLET | Freq: Every day | ORAL | 0 refills | Status: DC

## 2017-09-09 NOTE — Telephone Encounter (Signed)
PT NEEDED  ATORVASTATIN 10 MG QD RX FILLED UP TO 11-2017 APPT WITH DR Tamala Julian

## 2017-10-06 ENCOUNTER — Other Ambulatory Visit: Payer: Self-pay | Admitting: Interventional Cardiology

## 2017-12-09 NOTE — Progress Notes (Signed)
Cardiology Office Note:    Date:  12/10/2017   ID:  Katherine Floyd, DOB February 15, 1946, MRN [id number]  PCP:  Aletha Halim., PA-C  Cardiologist:  No primary care provider on file.   Referring MD: Aletha Halim., PA-C   Chief Complaint  Patient presents with  . Atrial Fibrillation  . Congestive Heart Failure    History of Present Illness:    Katherine Floyd is a 72 y.o. female with a hx of paroxysmal atrial fibrillation, diastolic heart failure, and essential hypertension.  She is doing well.  She denies recurrent episodes of shortness of breath, orthopnea, PND.  She has not had syncope.  She is able to lie flat without dyspnea.  Education side effects.  Amiodarone and anticoagulation therapy has been discontinued.  Past Medical History:  Diagnosis Date  . Anxiety   . Arthritis    with fracture right great toe 08/30/11 from "stepping wrong"  . Asthma   . Dysrhythmia 06/21/2013   NEW ONSET ATRIAL FIBRILATION/RVR  . GERD (gastroesophageal reflux disease)   . Hypotension   . Kidney stone   . Osteoporosis   . Polyp of colon, adenomatous    recurrent  . Urinary tract infection 08/29/11   Cipro per PCP- states is resolving    Past Surgical History:  Procedure Laterality Date  . ABDOMINAL HYSTERECTOMY  1984  . BACK SURGERY  1997-1998   fell on ice and snow  . BUNIONECTOMY  2002   right foot  . EYE SURGERY  2000   cataract extraction with IOL  . HAND SURGERY  1994   cyst removed  . HEMICOLECTOMY  09/06/11  . INCISIONAL HERNIA REPAIR N/A 01/31/2017   Procedure: LAPAROSCOPIC INCISIONAL HERNIA REPAIR WITH MESH;  Surgeon: Jackolyn Confer, MD;  Location: [facility];  Service: General;  Laterality: N/A;  . INSERTION OF MESH N/A 01/31/2017   Procedure: INSERTION OF MESH;  Surgeon: Jackolyn Confer, MD;  Location: [facility];  Service: General;  Laterality: N/A;  . [place]  . POLYPECTOMY    . SHOULDER SURGERY  2007   right   . TONSILLECTOMY  age of 17  . uretheral  dilitation      Current Medications: Current Meds  Medication Sig  . acetaminophen (TYLENOL) 650 MG CR tablet Take 1,300 mg by mouth every 8 (eight) hours as needed for pain.  Marland Kitchen albuterol (PROVENTIL) (2.5 MG/3ML) 0.083% nebulizer solution Take 2.5 mg by nebulization every 6 (six) hours as needed for wheezing or shortness of breath.   . ALPRAZolam (XANAX) 0.5 MG tablet Take 0.25 mg by mouth 2 (two) times daily as needed for anxiety. Anxiety  . aspirin EC 81 MG tablet Take 1 tablet (81 mg total) by mouth daily.  Marland Kitchen atorvastatin (LIPITOR) 10 MG tablet Take 1 tablet (10 mg total) by mouth at bedtime.  . budesonide-formoterol (SYMBICORT) 160-4.5 MCG/ACT inhaler Inhale 2 puffs into the lungs 2 (two) times daily.  . Calcium Carbonate-Vitamin D (CALCIUM-CARB 600 + D PO) Take 2 tablets by mouth daily.  . carboxymethylcellulose (REFRESH PLUS) 0.5 % SOLN Place 1 drop into both eyes 2 (two) times daily as needed. Dry eyes  . cetirizine (ZYRTEC) 10 MG tablet Take 10 mg by mouth daily.  . Cyanocobalamin (B-12-SL SL) Place 1,500 mcg under the tongue daily.  Marland Kitchen docusate sodium (COLACE) 100 MG capsule Take 100 mg by mouth 2 (two) times daily.   . fluticasone (FLONASE) 50 MCG/ACT nasal spray Place 2 sprays into  both nostrils 2 (two) times daily as needed for allergies.   . furosemide (LASIX) 40 MG tablet Take 1 tablet (40 mg total) by mouth daily. Please keep upcoming appt in August for future refills. Thank you  . Lutein 6 MG CAPS Take 6 mg by mouth daily.  . methocarbamol (ROBAXIN) 750 MG tablet Take 1 tablet (750 mg total) by mouth 3 (three) times daily.  . montelukast (SINGULAIR) 10 MG tablet Take 10 mg by mouth at bedtime.   . Multiple Vitamins-Minerals (MULTIVITAMIN WITH MINERALS) tablet Take 1 tablet by mouth daily.  Marland Kitchen omeprazole (PRILOSEC) 20 MG capsule Take 20 mg by mouth daily.  . potassium chloride SA (K-DUR,KLOR-CON) 20 MEQ tablet Take 1 tablet (20 mEq total) by mouth daily. Please keep upcoming appt  in August for future refills. Thank you  . vitamin E 400 UNIT capsule Take 400 Units by mouth daily.     Allergies:   Prednisone; Rofecoxib; Sulfur; and Ampicillin   Social History   Socioeconomic History  . Marital status: Married    Spouse name: Not on file  . Number of children: Not on file  . Years of education: Not on file  . Highest education level: Not on file  Occupational History  . Not on file  Social Needs  . Financial resource strain: Not on file  . Food insecurity:    Worry: Not on file    Inability: Not on file  . Transportation needs:    Medical: Not on file    Non-medical: Not on file  Tobacco Use  . Smoking status: Former Smoker    Packs/day: 1.50    Years: 10.00    Pack years: 15.00    Last attempt to quit: 09/01/1988    Years since quitting: 29.2  . Smokeless tobacco: Never Used  Substance and Sexual Activity  . Alcohol use: No  . Drug use: No  . Sexual activity: Not on file  Lifestyle  . Physical activity:    Days per week: Not on file    Minutes per session: Not on file  . Stress: Not on file  Relationships  . Social connections:    Talks on phone: Not on file    Gets together: Not on file    Attends religious service: Not on file    Active member of club or organization: Not on file    Attends meetings of clubs or organizations: Not on file    Relationship status: Not on file  Other Topics Concern  . Not on file  Social History Narrative  . Not on file     Family History: The patient's family history includes Cancer in her cousin, maternal uncle, and mother.  ROS:   Please see the history of present illness.    Arthritis in both knees.  All other systems reviewed and are negative.  EKGs/Labs/Other Studies Reviewed:    The following studies were reviewed today: No new data. Most recent laboratory data performed out of our system by [facility] demonstrates normal kidney function.  Potassium 4.3.  LDL cholesterol of  92.  EKG:  EKG is  ordered today.  The ekg ordered today demonstrates sinus rhythm, low voltage, otherwise unremarkable.  Prior tracing from 10/09/2016 demonstrates no significant difference.  Recent Labs: 01/24/2017: ALT 18; BUN 13; Creatinine, Ser 0.98; Hemoglobin 13.4; Platelets 233; Potassium 4.5; Sodium 141  Recent Lipid Panel    Component Value Date/Time   CHOL 179 01/11/2015 0829   TRIG  66.0 01/11/2015 0829   HDL 85.90 01/11/2015 0829   CHOLHDL 2 01/11/2015 0829   VLDL 13.2 01/11/2015 0829   LDLCALC 80 01/11/2015 0829    Physical Exam:    VS:  BP 126/74   Pulse 62   Ht 5\' 1"  (1.549 m)   Wt 159 lb 12.8 oz (72.5 kg)   BMI 30.19 kg/m     Wt Readings from Last 3 Encounters:  12/10/17 159 lb 12.8 oz (72.5 kg)  01/31/17 164 lb (74.4 kg)  01/24/17 164 lb 4 oz (74.5 kg)     GEN:  Well nourished, well developed in no acute distress HEENT: Normal NECK: No JVD. LYMPHATICS: No lymphadenopathy CARDIAC: RRR, no murmur, no gallop, no  edema. VASCULAR: Radial and posterior tibial 2+ pulses.  No bruits. RESPIRATORY:  Clear to auscultation without rales, wheezing or rhonchi  ABDOMEN: Soft, non-tender, non-distended, No pulsatile mass, MUSCULOSKELETAL: No deformity  SKIN: Warm and dry NEUROLOGIC:  Alert and oriented x 3 PSYCHIATRIC:  Normal affect   ASSESSMENT:    1. Paroxysmal atrial fibrillation (HCC)   2. Chronic diastolic HF (heart failure) ([place])   3. Essential hypertension    PLAN:    In order of problems listed above:  1. No clinical recurrence of atrial fibrillation.  Now off amiodarone and anticoagulation therapy for greater than 2 years. 2. No evidence of volume overload.  Only episode of diastolic heart failure was during atrial fibrillation.  She should probably have a repeat echocardiogram done prior to the next office visit given the low voltage noted on EKG to exclude progression in LV wall thickening which would raise the question of amyloid. 3. Excellent  blood pressure control with target 130/80 mmHg or less.  Clinical follow-up in 1 year.  Consider repeat echocardiogram to assess LV wall thickness and help consider the potential diagnosis of amyloid.  Concern is due to the presence of low voltage on EKG that is progressing.   Medication Adjustments/Labs and Tests Ordered: Current medicines are reviewed at length with the patient today.  Concerns regarding medicines are outlined above.  Orders Placed This Encounter  Procedures  . EKG 12-Lead   No orders of the defined types were placed in this encounter.   Patient Instructions  Medication Instructions:  Your physician recommends that you continue on your current medications as directed. Please refer to the Current Medication list given to you today.  Labwork: None  Testing/Procedures: None  Follow-Up: Your physician wants you to follow-up in: 1 year with Dr. Tamala Julian.  You will receive a reminder letter in the mail two months in advance. If you don't receive a letter, please call our office to schedule the follow-up appointment.   Any Other Special Instructions Will Be Listed Below (If Applicable).     If you need a refill on your cardiac medications before your next appointment, please call your pharmacy.      Signed, Sinclair Grooms, MD  12/10/2017 1:07 PM    [place]

## 2017-12-10 ENCOUNTER — Telehealth: Payer: Self-pay | Admitting: *Deleted

## 2017-12-10 ENCOUNTER — Ambulatory Visit: Payer: Medicare HMO | Admitting: Interventional Cardiology

## 2017-12-10 ENCOUNTER — Encounter: Payer: Self-pay | Admitting: Interventional Cardiology

## 2017-12-10 VITALS — BP 126/74 | HR 62 | Ht 61.0 in | Wt 159.8 lb

## 2017-12-10 DIAGNOSIS — I480 Paroxysmal atrial fibrillation: Secondary | ICD-10-CM

## 2017-12-10 DIAGNOSIS — I5032 Chronic diastolic (congestive) heart failure: Secondary | ICD-10-CM | POA: Diagnosis not present

## 2017-12-10 DIAGNOSIS — I10 Essential (primary) hypertension: Secondary | ICD-10-CM | POA: Diagnosis not present

## 2017-12-10 NOTE — Telephone Encounter (Signed)
Spoke with pt and went over recommendations.  Pt verbalized understanding and was in agreement with this plan.  

## 2017-12-10 NOTE — Telephone Encounter (Signed)
-----   Message from Belva Crome, MD sent at 12/10/2017  1:15 PM EDT ----- Regarding: Low voltage on EKG When I compare the current EKG with prior, she is developing progressive low voltage.  This raises the question of amyloidosis.  Please have her get an echocardiogram sometime between now and her next office visit with me.  No rush.

## 2017-12-10 NOTE — Patient Instructions (Addendum)

## 2018-01-04 ENCOUNTER — Other Ambulatory Visit: Payer: Self-pay | Admitting: Interventional Cardiology

## 2018-01-08 ENCOUNTER — Ambulatory Visit (HOSPITAL_COMMUNITY): Payer: Medicare HMO | Attending: Cardiology

## 2018-01-08 ENCOUNTER — Other Ambulatory Visit: Payer: Self-pay

## 2018-01-08 DIAGNOSIS — Z87891 Personal history of nicotine dependence: Secondary | ICD-10-CM | POA: Diagnosis not present

## 2018-01-08 DIAGNOSIS — I351 Nonrheumatic aortic (valve) insufficiency: Secondary | ICD-10-CM | POA: Insufficient documentation

## 2018-01-08 DIAGNOSIS — I5032 Chronic diastolic (congestive) heart failure: Secondary | ICD-10-CM | POA: Diagnosis not present

## 2018-01-08 DIAGNOSIS — I480 Paroxysmal atrial fibrillation: Secondary | ICD-10-CM | POA: Insufficient documentation

## 2018-01-08 DIAGNOSIS — I959 Hypotension, unspecified: Secondary | ICD-10-CM | POA: Diagnosis not present

## 2018-06-01 ENCOUNTER — Other Ambulatory Visit: Payer: Self-pay | Admitting: Family Medicine

## 2018-06-01 ENCOUNTER — Ambulatory Visit
Admission: RE | Admit: 2018-06-01 | Discharge: 2018-06-01 | Disposition: A | Discharge status: Home / Self Care | Payer: Medicare HMO | Source: Ambulatory Visit | Attending: Family Medicine | Admitting: Family Medicine

## 2018-06-01 DIAGNOSIS — J22 Unspecified acute lower respiratory infection: Secondary | ICD-10-CM

## 2018-11-03 ENCOUNTER — Ambulatory Visit
Admission: RE | Admit: 2018-11-03 | Discharge: 2018-11-03 | Disposition: A | Discharge status: Home / Self Care | Payer: Medicare HMO | Source: Ambulatory Visit | Attending: Family Medicine | Admitting: Family Medicine

## 2018-11-03 ENCOUNTER — Other Ambulatory Visit: Payer: Self-pay | Admitting: Family Medicine

## 2018-11-03 DIAGNOSIS — M25432 Effusion, left wrist: Secondary | ICD-10-CM

## 2018-11-03 DIAGNOSIS — W101XXA Fall (on)(from) sidewalk curb, initial encounter: Secondary | ICD-10-CM

## 2018-11-29 ENCOUNTER — Other Ambulatory Visit: Payer: Self-pay | Admitting: Interventional Cardiology

## 2018-12-14 NOTE — Progress Notes (Signed)
Cardiology Office Note:    Date:  12/16/2018   ID:  Katherine Floyd, DOB 1945/06/28, MRN [id number]  PCP:  Aletha Halim., PA-C  Cardiologist:  Sinclair Grooms, MD   Referring MD: Aletha Halim., PA-C   Chief Complaint  Patient presents with  . Atrial Fibrillation  . Shortness of Breath    History of Present Illness:    Katherine Floyd is a 73 y.o. female with a hx of  a hx of paroxysmal atrial fibrillation, diastolic heart failure, and essential hypertension.  Katherine Floyd is doing well and has not suffered any symptomatic episodes of atrial fibrillation.  She is physically active.  She is doing well.  She denies orthopnea, PND, lower extremity swelling.  No claudication.  Medications are being taken as prescribed.  Past Medical History:  Diagnosis Date  . Anxiety   . Arthritis    with fracture right great toe 08/30/11 from "stepping wrong"  . Asthma   . Dysrhythmia 06/21/2013   NEW ONSET ATRIAL FIBRILATION/RVR  . GERD (gastroesophageal reflux disease)   . Hypotension   . Kidney stone   . Osteoporosis   . Polyp of colon, adenomatous    recurrent  . Urinary tract infection 08/29/11   Cipro per PCP- states is resolving    Past Surgical History:  Procedure Laterality Date  . ABDOMINAL HYSTERECTOMY  1984  . BACK SURGERY  1997-1998   fell on ice and snow  . BUNIONECTOMY  2002   right foot  . EYE SURGERY  2000   cataract extraction with IOL  . HAND SURGERY  1994   cyst removed  . HEMICOLECTOMY  09/06/11  . INCISIONAL HERNIA REPAIR N/A 01/31/2017   Procedure: LAPAROSCOPIC INCISIONAL HERNIA REPAIR WITH MESH;  Surgeon: Jackolyn Confer, MD;  Location: [facility];  Service: General;  Laterality: N/A;  . INSERTION OF MESH N/A 01/31/2017   Procedure: INSERTION OF MESH;  Surgeon: Jackolyn Confer, MD;  Location: [facility];  Service: General;  Laterality: N/A;  . [place]  . POLYPECTOMY    . SHOULDER SURGERY  2007   right   . TONSILLECTOMY  age of 63  . uretheral  dilitation      Current Medications: Current Meds  Medication Sig  . acetaminophen (TYLENOL) 650 MG CR tablet Take 1,300 mg by mouth every 8 (eight) hours as needed for pain.  Marland Kitchen albuterol (PROVENTIL) (2.5 MG/3ML) 0.083% nebulizer solution Take 2.5 mg by nebulization every 6 (six) hours as needed for wheezing or shortness of breath.   . ALPRAZolam (XANAX) 0.5 MG tablet Take 0.25 mg by mouth 2 (two) times daily as needed for anxiety. Anxiety  . aspirin EC 81 MG tablet Take 1 tablet (81 mg total) by mouth daily.  Marland Kitchen atorvastatin (LIPITOR) 10 MG tablet TAKE 1 TABLET(10 MG) BY MOUTH AT BEDTIME  . B Complex Vitamins (VITAMIN B-COMPLEX) TABS Take 1 tablet by mouth daily.  . budesonide-formoterol (SYMBICORT) 160-4.5 MCG/ACT inhaler Inhale 2 puffs into the lungs 2 (two) times daily.  . Calcium Carbonate-Vitamin D (CALCIUM-CARB 600 + D PO) Take 2 tablets by mouth daily.  . carboxymethylcellulose (REFRESH PLUS) 0.5 % SOLN Place 1 drop into both eyes 2 (two) times daily as needed. Dry eyes  . cetirizine (ZYRTEC) 10 MG tablet Take 10 mg by mouth daily.  . Cyanocobalamin (B-12-SL SL) Place 1,500 mcg under the tongue daily.  Marland Kitchen docusate sodium (COLACE) 100 MG capsule Take 100 mg by mouth 2 (two)  times daily.   . fluticasone (FLONASE) 50 MCG/ACT nasal spray Place 2 sprays into both nostrils 2 (two) times daily as needed for allergies.   . furosemide (LASIX) 40 MG tablet TAKE 1 TABLET BY MOUTH DAILY  . HYDROcodone-acetaminophen (NORCO/VICODIN) 5-325 MG tablet Take 1 tablet by mouth as needed.  . Lutein 6 MG CAPS Take 6 mg by mouth daily.  . montelukast (SINGULAIR) 10 MG tablet Take 10 mg by mouth at bedtime.   . Multiple Vitamins-Minerals (MULTIVITAMIN WITH MINERALS) tablet Take 1 tablet by mouth daily.  Marland Kitchen omeprazole (PRILOSEC) 20 MG capsule Take 20 mg by mouth daily.  . potassium chloride SA (K-DUR) 20 MEQ tablet TAKE 1 TABLET BY MOUTH DAILY  . vitamin E 400 UNIT capsule Take 400 Units by mouth daily.      Allergies:   Prednisone, Rofecoxib, Sulfur, and Ampicillin   Social History   Socioeconomic History  . Marital status: Married    Spouse name: Not on file  . Number of children: Not on file  . Years of education: Not on file  . Highest education level: Not on file  Occupational History  . Not on file  Social Needs  . Financial resource strain: Not on file  . Food insecurity    Worry: Not on file    Inability: Not on file  . Transportation needs    Medical: Not on file    Non-medical: Not on file  Tobacco Use  . Smoking status: Former Smoker    Packs/day: 1.50    Years: 10.00    Pack years: 15.00    Quit date: 09/01/1988    Years since quitting: 30.3  . Smokeless tobacco: Never Used  Substance and Sexual Activity  . Alcohol use: No  . Drug use: No  . Sexual activity: Not on file  Lifestyle  . Physical activity    Days per week: Not on file    Minutes per session: Not on file  . Stress: Not on file  Relationships  . Social Herbalist on phone: Not on file    Gets together: Not on file    Attends religious service: Not on file    Active member of club or organization: Not on file    Attends meetings of clubs or organizations: Not on file    Relationship status: Not on file  Other Topics Concern  . Not on file  Social History Narrative  . Not on file     Family History: The patient's family history includes Cancer in her cousin, maternal uncle, and mother.  ROS:   Please see the history of present illness.    She recently fell due to a misstep and fractured her left fifth finger.  She is wearing an orthopedic device to support the finger and immobilize.  All other systems reviewed and are negative.  EKGs/Labs/Other Studies Reviewed:    The following studies were reviewed today: No recent cardiac evaluation has been necessary.  EKG:  EKG performed December 16, 2018, reveals sinus rhythm, nonspecific T wave flattening, low voltage, and poor R wave  progression.  Recent Labs: No results found for requested labs within last 8760 hours.  Recent Lipid Panel    Component Value Date/Time   CHOL 179 01/11/2015 0829   TRIG 66.0 01/11/2015 0829   HDL 85.90 01/11/2015 0829   CHOLHDL 2 01/11/2015 0829   VLDL 13.2 01/11/2015 0829   LDLCALC 80 01/11/2015 0829    Physical Exam:  VS:  BP 124/68   Pulse 71   Ht 5\' 1"  (1.549 m)   Wt 157 lb 3.2 oz (71.3 kg)   SpO2 97%   BMI 29.70 kg/m     Wt Readings from Last 3 Encounters:  12/16/18 157 lb 3.2 oz (71.3 kg)  12/10/17 159 lb 12.8 oz (72.5 kg)  01/31/17 164 lb (74.4 kg)     GEN: Obesity. No acute distress HEENT: Normal NECK: No JVD. LYMPHATICS: No lymphadenopathy CARDIAC:  RRR without murmur, gallop, or edema. VASCULAR:  Normal Pulses. No bruits. RESPIRATORY:  Clear to auscultation without rales, wheezing or rhonchi  ABDOMEN: Soft, non-tender, non-distended, No pulsatile mass, MUSCULOSKELETAL: No deformity  SKIN: Warm and dry NEUROLOGIC:  Alert and oriented x 3 PSYCHIATRIC:  Normal affect   ASSESSMENT:    1. Chronic diastolic HF (heart failure) (HCC)   2. Paroxysmal atrial fibrillation ([place])   3. Essential hypertension   4. Nonspecific abnormal electrocardiogram (ECG) (EKG)   5. Educated About Covid-19 Virus Infection    PLAN:    In order of problems listed above:  1. No evidence of volume overload 2. Normal sinus rhythm on EKG without symptoms to suggest recurrence.  Anticoagulation therapy has been discontinued. 3. Excellent current blood pressure control.  Target 130/80 mmHg. 4. Relatively low voltage on EKG under appropriate circumstances could suggest infiltrative process but currently no reason to pursue with further evaluation. 5. Hand washing, mask wearing, and social distancing discussed in detail.   Medication Adjustments/Labs and Tests Ordered: Current medicines are reviewed at length with the patient today.  Concerns regarding medicines are outlined  above.  Orders Placed This Encounter  Procedures  . EKG 12-Lead   No orders of the defined types were placed in this encounter.   Patient Instructions  Medication Instructions:  Your physician recommends that you continue on your current medications as directed. Please refer to the Current Medication list given to you today.  If you need a refill on your cardiac medications before your next appointment, please call your pharmacy.   Lab work: None If you have labs (blood work) drawn today and your tests are completely normal, you will receive your results only by: Marland Kitchen MyChart Message (if you have MyChart) OR . A paper copy in the mail If you have any lab test that is abnormal or we need to change your treatment, we will call you to review the results.  Testing/Procedures: None  Follow-Up: At [facility], you and your health needs are our priority.  As part of our continuing mission to provide you with exceptional heart care, we have created designated Provider Care Teams.  These Care Teams include your primary Cardiologist (physician) and Advanced Practice Providers (APPs -  Physician Assistants and Nurse Practitioners) who all work together to provide you with the care you need, when you need it. You will need a follow up appointment in 12 months.  Please call our office 2 months in advance to schedule this appointment.  You may see Dr. Tamala Julian or one of the following Advanced Practice Providers on your designated Care Team:   Truitt Merle, NP Cecilie Kicks, NP . Kathyrn Drown, NP  Any Other Special Instructions Will Be Listed Below (If Applicable).       Signed, Sinclair Grooms, MD  12/16/2018 11:13 AM    [place]

## 2018-12-16 ENCOUNTER — Ambulatory Visit (INDEPENDENT_AMBULATORY_CARE_PROVIDER_SITE_OTHER): Payer: Medicare HMO | Admitting: Interventional Cardiology

## 2018-12-16 ENCOUNTER — Encounter: Payer: Self-pay | Admitting: Interventional Cardiology

## 2018-12-16 ENCOUNTER — Other Ambulatory Visit: Payer: Self-pay

## 2018-12-16 VITALS — BP 124/68 | HR 71 | Ht 61.0 in | Wt 157.2 lb

## 2018-12-16 DIAGNOSIS — R9431 Abnormal electrocardiogram [ECG] [EKG]: Secondary | ICD-10-CM | POA: Diagnosis not present

## 2018-12-16 DIAGNOSIS — I480 Paroxysmal atrial fibrillation: Secondary | ICD-10-CM

## 2018-12-16 DIAGNOSIS — I10 Essential (primary) hypertension: Secondary | ICD-10-CM | POA: Diagnosis not present

## 2018-12-16 DIAGNOSIS — I5032 Chronic diastolic (congestive) heart failure: Secondary | ICD-10-CM

## 2018-12-16 DIAGNOSIS — Z7189 Other specified counseling: Secondary | ICD-10-CM

## 2018-12-16 NOTE — Patient Instructions (Signed)

## 2019-03-19 ENCOUNTER — Other Ambulatory Visit: Payer: Self-pay | Admitting: Interventional Cardiology

## 2019-03-19 MED ORDER — ATORVASTATIN CALCIUM 10 MG PO TABS: ORAL_TABLET | ORAL | 2 refills | Status: DC

## 2019-03-19 MED ORDER — POTASSIUM CHLORIDE CRYS ER 20 MEQ PO TBCR: 20.0000 meq | EXTENDED_RELEASE_TABLET | Freq: Every day | ORAL | 2 refills | Status: DC

## 2019-03-19 MED ORDER — FUROSEMIDE 40 MG PO TABS: 40.0000 mg | ORAL_TABLET | Freq: Every day | ORAL | 2 refills | Status: DC

## 2019-03-19 NOTE — Addendum Note (Signed)
Addended by: Derl Barrow on: 03/19/2019 09:43 AM   Modules accepted: Orders

## 2019-05-12 ENCOUNTER — Other Ambulatory Visit: Payer: Self-pay | Admitting: Radiology

## 2019-05-27 ENCOUNTER — Ambulatory Visit: Payer: Self-pay | Admitting: General Surgery

## 2019-05-27 ENCOUNTER — Telehealth: Payer: Self-pay | Admitting: *Deleted

## 2019-05-27 DIAGNOSIS — N6022 Fibroadenosis of left breast: Secondary | ICD-10-CM

## 2019-05-27 NOTE — Telephone Encounter (Signed)
   [facility] Pre-operative Risk Assessment    Request for surgical clearance:  1. What type of surgery is being performed? BREAST LUMPECTOMY   2. When is this surgery scheduled? TBD   3. What type of clearance is required (medical clearance vs. Pharmacy clearance to hold med vs. Both)? MEDICAL  4. Are there any medications that need to be held prior to surgery and how long? ASA    5. Practice name and name of physician performing surgery? [facility]; DR. PAUL TOTH   6. What is your office phone number [phone]    7.   What is your office fax number [phone] ATTN: MICHELLE BROOKS, CMA  8.   Anesthesia type (None, local, MAC, general) ? GENERAL   Katherine Floyd 05/27/2019, 5:05 PM  _________________________________________________________________   (provider comments below)

## 2019-05-28 NOTE — Telephone Encounter (Signed)
   Primary Cardiologist: Sinclair Grooms, MD  Chart reviewed as part of pre-operative protocol coverage. Patient last seen by Dr. Tamala Julian on 12/16/2018 and was doing well from a cardiac standpoint. I called patient today and she reports that she has continued to do well since her last visit. No chest pain, shortness of breath, palpitations, lightheadedness, dizziness, syncope, orthopnea, PND, or edema. She walks 1-2 miles every day (if weather permits) without any anginal symptoms. Able to complete >4.0 METS. Given past medical history and time since last visit, based on ACC/AHA guidelines, Katherine Floyd would be at acceptable risk for the planned procedure without further cardiovascular testing.   OK to hold Aspirin 5-7 days prior to surgery if needed with a plan to resume it as soon as felt to be feasible from a surgical standpoint in the post-operative period.  I will route this recommendation to the requesting party via Epic fax function and remove from pre-op pool.  Please call with questions.  Darreld Mclean, PA-C 05/28/2019, 9:11 AM

## 2019-06-02 ENCOUNTER — Ambulatory Visit: Payer: Self-pay | Admitting: General Surgery

## 2019-07-28 ENCOUNTER — Other Ambulatory Visit: Payer: Self-pay

## 2019-07-28 ENCOUNTER — Encounter (HOSPITAL_BASED_OUTPATIENT_CLINIC_OR_DEPARTMENT_OTHER): Payer: Self-pay | Admitting: General Surgery

## 2019-08-03 ENCOUNTER — Other Ambulatory Visit (HOSPITAL_COMMUNITY): Payer: Medicare HMO | Attending: General Surgery

## 2019-08-03 NOTE — Progress Notes (Signed)

## 2019-08-06 ENCOUNTER — Ambulatory Visit (HOSPITAL_BASED_OUTPATIENT_CLINIC_OR_DEPARTMENT_OTHER): Payer: Medicare HMO | Admitting: Certified Registered Nurse Anesthetist

## 2019-08-06 ENCOUNTER — Encounter (HOSPITAL_BASED_OUTPATIENT_CLINIC_OR_DEPARTMENT_OTHER): Payer: Self-pay | Admitting: General Surgery

## 2019-08-06 ENCOUNTER — Other Ambulatory Visit: Payer: Self-pay

## 2019-08-06 ENCOUNTER — Ambulatory Visit (HOSPITAL_BASED_OUTPATIENT_CLINIC_OR_DEPARTMENT_OTHER)
Admission: RE | Admit: 2019-08-06 | Discharge: 2019-08-06 | Disposition: A | Discharge status: Home / Self Care | Payer: Medicare HMO | Attending: General Surgery | Admitting: General Surgery

## 2019-08-06 ENCOUNTER — Encounter (HOSPITAL_BASED_OUTPATIENT_CLINIC_OR_DEPARTMENT_OTHER)
Admission: RE | Disposition: A | Discharge status: Home / Self Care | Payer: Self-pay | Source: Home / Self Care | Attending: General Surgery

## 2019-08-06 DIAGNOSIS — Z87891 Personal history of nicotine dependence: Secondary | ICD-10-CM | POA: Diagnosis not present

## 2019-08-06 DIAGNOSIS — I119 Hypertensive heart disease without heart failure: Secondary | ICD-10-CM | POA: Insufficient documentation

## 2019-08-06 DIAGNOSIS — J45909 Unspecified asthma, uncomplicated: Secondary | ICD-10-CM | POA: Diagnosis not present

## 2019-08-06 DIAGNOSIS — N6489 Other specified disorders of breast: Secondary | ICD-10-CM | POA: Diagnosis not present

## 2019-08-06 DIAGNOSIS — K219 Gastro-esophageal reflux disease without esophagitis: Secondary | ICD-10-CM | POA: Diagnosis not present

## 2019-08-06 DIAGNOSIS — Z7951 Long term (current) use of inhaled steroids: Secondary | ICD-10-CM | POA: Insufficient documentation

## 2019-08-06 DIAGNOSIS — Z79899 Other long term (current) drug therapy: Secondary | ICD-10-CM | POA: Diagnosis not present

## 2019-08-06 DIAGNOSIS — Z803 Family history of malignant neoplasm of breast: Secondary | ICD-10-CM | POA: Diagnosis not present

## 2019-08-06 DIAGNOSIS — N6022 Fibroadenosis of left breast: Secondary | ICD-10-CM | POA: Insufficient documentation

## 2019-08-06 DIAGNOSIS — F419 Anxiety disorder, unspecified: Secondary | ICD-10-CM | POA: Diagnosis not present

## 2019-08-06 HISTORY — PX: BREAST LUMPECTOMY WITH RADIOACTIVE SEED LOCALIZATION: SHX6424

## 2019-08-06 SURGERY — BREAST LUMPECTOMY WITH RADIOACTIVE SEED LOCALIZATION
Anesthesia: General | Site: Breast | Laterality: Left

## 2019-08-06 MED ORDER — BUPIVACAINE HCL (PF) 0.25 % IJ SOLN
INTRAMUSCULAR | Status: AC
  Filled 2019-08-06: qty 30

## 2019-08-06 MED ORDER — VANCOMYCIN HCL IN DEXTROSE 1-5 GM/200ML-% IV SOLN
INTRAVENOUS | Status: AC
  Filled 2019-08-06: qty 200

## 2019-08-06 MED ORDER — LIDOCAINE HCL (CARDIAC) PF 100 MG/5ML IV SOSY
PREFILLED_SYRINGE | INTRAVENOUS | Status: DC | PRN
  Administered 2019-08-06: 80 mg via INTRAVENOUS

## 2019-08-06 MED ORDER — DEXAMETHASONE SODIUM PHOSPHATE 10 MG/ML IJ SOLN
INTRAMUSCULAR | Status: DC | PRN
  Administered 2019-08-06: 5 mg via INTRAVENOUS

## 2019-08-06 MED ORDER — DEXAMETHASONE SODIUM PHOSPHATE 10 MG/ML IJ SOLN
INTRAMUSCULAR | Status: AC
  Filled 2019-08-06: qty 1

## 2019-08-06 MED ORDER — ONDANSETRON HCL 4 MG/2ML IJ SOLN
INTRAMUSCULAR | Status: DC | PRN
  Administered 2019-08-06: 4 mg via INTRAVENOUS

## 2019-08-06 MED ORDER — FENTANYL CITRATE (PF) 100 MCG/2ML IJ SOLN
INTRAMUSCULAR | Status: DC | PRN
  Administered 2019-08-06 (×2): 25 ug via INTRAVENOUS

## 2019-08-06 MED ORDER — PHENYLEPHRINE 40 MCG/ML (10ML) SYRINGE FOR IV PUSH (FOR BLOOD PRESSURE SUPPORT)
PREFILLED_SYRINGE | INTRAVENOUS | Status: AC
  Filled 2019-08-06: qty 10

## 2019-08-06 MED ORDER — LACTATED RINGERS IV SOLN: INTRAVENOUS | Status: DC

## 2019-08-06 MED ORDER — PHENYLEPHRINE 40 MCG/ML (10ML) SYRINGE FOR IV PUSH (FOR BLOOD PRESSURE SUPPORT)
PREFILLED_SYRINGE | INTRAVENOUS | Status: DC | PRN
  Administered 2019-08-06: 80 ug via INTRAVENOUS
  Administered 2019-08-06: 120 ug via INTRAVENOUS
  Administered 2019-08-06: 80 ug via INTRAVENOUS

## 2019-08-06 MED ORDER — MIDAZOLAM HCL 2 MG/2ML IJ SOLN: 1.0000 mg | INTRAMUSCULAR | Status: DC | PRN

## 2019-08-06 MED ORDER — LIDOCAINE 2% (20 MG/ML) 5 ML SYRINGE
INTRAMUSCULAR | Status: AC
  Filled 2019-08-06: qty 5

## 2019-08-06 MED ORDER — ONDANSETRON HCL 4 MG/2ML IJ SOLN: 4.0000 mg | Freq: Once | INTRAMUSCULAR | Status: DC | PRN

## 2019-08-06 MED ORDER — GABAPENTIN 300 MG PO CAPS
300.0000 mg | ORAL_CAPSULE | ORAL | Status: AC
  Administered 2019-08-06: 300 mg via ORAL

## 2019-08-06 MED ORDER — CHLORHEXIDINE GLUCONATE CLOTH 2 % EX PADS: 6.0000 | MEDICATED_PAD | Freq: Once | CUTANEOUS | Status: DC

## 2019-08-06 MED ORDER — BUPIVACAINE-EPINEPHRINE (PF) 0.25% -1:200000 IJ SOLN
INTRAMUSCULAR | Status: DC | PRN
  Administered 2019-08-06: 20 mL via PERINEURAL

## 2019-08-06 MED ORDER — ACETAMINOPHEN 500 MG PO TABS
1000.0000 mg | ORAL_TABLET | ORAL | Status: AC
  Administered 2019-08-06: 1000 mg via ORAL

## 2019-08-06 MED ORDER — FENTANYL CITRATE (PF) 100 MCG/2ML IJ SOLN
INTRAMUSCULAR | Status: AC
  Filled 2019-08-06: qty 2

## 2019-08-06 MED ORDER — FENTANYL CITRATE (PF) 100 MCG/2ML IJ SOLN: 50.0000 ug | INTRAMUSCULAR | Status: DC | PRN

## 2019-08-06 MED ORDER — GABAPENTIN 300 MG PO CAPS
ORAL_CAPSULE | ORAL | Status: AC
  Filled 2019-08-06: qty 1

## 2019-08-06 MED ORDER — PROPOFOL 10 MG/ML IV BOLUS
INTRAVENOUS | Status: DC | PRN
  Administered 2019-08-06: 140 mg via INTRAVENOUS

## 2019-08-06 MED ORDER — PROPOFOL 500 MG/50ML IV EMUL
INTRAVENOUS | Status: AC
  Filled 2019-08-06: qty 50

## 2019-08-06 MED ORDER — FENTANYL CITRATE (PF) 100 MCG/2ML IJ SOLN: 25.0000 ug | INTRAMUSCULAR | Status: DC | PRN

## 2019-08-06 MED ORDER — ACETAMINOPHEN 500 MG PO TABS
ORAL_TABLET | ORAL | Status: AC
  Filled 2019-08-06: qty 2

## 2019-08-06 MED ORDER — HYDROCODONE-ACETAMINOPHEN 5-325 MG PO TABS: 1.0000 | ORAL_TABLET | Freq: Four times a day (QID) | ORAL | 0 refills | Status: DC | PRN

## 2019-08-06 MED ORDER — VANCOMYCIN HCL IN DEXTROSE 1-5 GM/200ML-% IV SOLN
1000.0000 mg | INTRAVENOUS | Status: AC
  Administered 2019-08-06: 1000 mg via INTRAVENOUS

## 2019-08-06 MED ORDER — ONDANSETRON HCL 4 MG/2ML IJ SOLN
INTRAMUSCULAR | Status: AC
  Filled 2019-08-06: qty 2

## 2019-08-06 SURGICAL SUPPLY — 52 items
ADH SKN CLS APL DERMABOND .7 (GAUZE/BANDAGES/DRESSINGS) ×1
APL PRP STRL LF DISP 70% ISPRP (MISCELLANEOUS) ×1
APPLIER CLIP 9.375 MED OPEN (MISCELLANEOUS)
APR CLP MED 9.3 20 MLT OPN (MISCELLANEOUS)
BLADE SURG 15 STRL LF DISP TIS (BLADE) ×1 IMPLANT
BLADE SURG 15 STRL SS (BLADE) ×3
CANISTER SUC SOCK COL 7IN (MISCELLANEOUS) ×3 IMPLANT
CANISTER SUCT 1200ML W/VALVE (MISCELLANEOUS) ×3 IMPLANT
CHLORAPREP W/TINT 26 (MISCELLANEOUS) ×3 IMPLANT
CLIP APPLIE 9.375 MED OPEN (MISCELLANEOUS) IMPLANT
COVER BACK TABLE 60X90IN (DRAPES) ×3 IMPLANT
COVER MAYO STAND STRL (DRAPES) ×3 IMPLANT
COVER PROBE W GEL 5X96 (DRAPES) ×3 IMPLANT
COVER WAND RF STERILE (DRAPES) IMPLANT
DECANTER SPIKE VIAL GLASS SM (MISCELLANEOUS) IMPLANT
DERMABOND ADVANCED (GAUZE/BANDAGES/DRESSINGS) ×2
DERMABOND ADVANCED .7 DNX12 (GAUZE/BANDAGES/DRESSINGS) ×1 IMPLANT
DRAPE LAPAROSCOPIC ABDOMINAL (DRAPES) ×3 IMPLANT
DRAPE UTILITY XL STRL (DRAPES) ×3 IMPLANT
ELECT COATED BLADE 2.86 ST (ELECTRODE) ×3 IMPLANT
ELECT REM PT RETURN 9FT ADLT (ELECTROSURGICAL) ×3
ELECTRODE REM PT RTRN 9FT ADLT (ELECTROSURGICAL) ×1 IMPLANT
GLOVE BIO SURGEON STRL SZ7.5 (GLOVE) ×6 IMPLANT
GLOVE BIOGEL PI IND STRL 6.5 (GLOVE) ×1 IMPLANT
GLOVE BIOGEL PI IND STRL 7.0 (GLOVE) IMPLANT
GLOVE BIOGEL PI INDICATOR 6.5 (GLOVE) ×2
GLOVE BIOGEL PI INDICATOR 7.0 (GLOVE) ×4
GLOVE SURG SS PI 6.5 STRL IVOR (GLOVE) ×2 IMPLANT
GLOVE SURG SYN 7.5  E (GLOVE) ×3
GLOVE SURG SYN 7.5 E (GLOVE) ×1 IMPLANT
GLOVE SURG SYN 7.5 PF PI (GLOVE) IMPLANT
GOWN STRL REUS W/ TWL LRG LVL3 (GOWN DISPOSABLE) ×2 IMPLANT
GOWN STRL REUS W/TWL LRG LVL3 (GOWN DISPOSABLE) ×6
ILLUMINATOR WAVEGUIDE N/F (MISCELLANEOUS) ×2 IMPLANT
KIT MARKER MARGIN INK (KITS) ×3 IMPLANT
LIGHT WAVEGUIDE WIDE FLAT (MISCELLANEOUS) IMPLANT
NDL HYPO 25X1 1.5 SAFETY (NEEDLE) IMPLANT
NEEDLE HYPO 25X1 1.5 SAFETY (NEEDLE) IMPLANT
NS IRRIG 1000ML POUR BTL (IV SOLUTION) IMPLANT
PACK BASIN DAY SURGERY FS (CUSTOM PROCEDURE TRAY) ×3 IMPLANT
PENCIL SMOKE EVACUATOR (MISCELLANEOUS) ×3 IMPLANT
SLEEVE SCD COMPRESS KNEE MED (MISCELLANEOUS) ×3 IMPLANT
SPONGE LAP 18X18 RF (DISPOSABLE) ×3 IMPLANT
SUT MON AB 4-0 PC3 18 (SUTURE) ×3 IMPLANT
SUT SILK 2 0 SH (SUTURE) IMPLANT
SUT VICRYL 3-0 CR8 SH (SUTURE) ×3 IMPLANT
SYR CONTROL 10ML LL (SYRINGE) IMPLANT
TOWEL GREEN STERILE FF (TOWEL DISPOSABLE) ×3 IMPLANT
TRAY FAXITRON CT DISP (TRAY / TRAY PROCEDURE) ×3 IMPLANT
TUBE CONNECTING 20'X1/4 (TUBING) ×1
TUBE CONNECTING 20X1/4 (TUBING) ×2 IMPLANT
YANKAUER SUCT BULB TIP NO VENT (SUCTIONS) IMPLANT

## 2019-08-06 NOTE — Interval H&P Note (Signed)
History and Physical Interval Note:  08/06/2019 8:20 AM  Katherine Floyd  has presented today for surgery, with the diagnosis of LEFT BREAST COMPLEX SCLEROSING LESION.  The various methods of treatment have been discussed with the patient and family. After consideration of risks, benefits and other options for treatment, the patient has consented to  Procedure(s): LEFT BREAST LUMPECTOMY WITH RADIOACTIVE SEED LOCALIZATION (Left) as a surgical intervention.  The patient's history has been reviewed, patient examined, no change in status, stable for surgery.  I have reviewed the patient's chart and labs.  Questions were answered to the patient's satisfaction.     [name]

## 2019-08-06 NOTE — Anesthesia Procedure Notes (Signed)
Procedure Name: LMA Insertion Date/Time: 08/06/2019 8:34 AM Performed by: Raenette Rover, CRNA Pre-anesthesia Checklist: Patient identified, Emergency Drugs available, Suction available and Patient being monitored Patient Re-evaluated:Patient Re-evaluated prior to induction Oxygen Delivery Method: Circle system utilized Preoxygenation: Pre-oxygenation with 100% oxygen Induction Type: IV induction LMA: LMA inserted LMA Size: 4.0 Number of attempts: 1 Placement Confirmation: positive ETCO2 and breath sounds checked- equal and bilateral Tube secured with: Tape Dental Injury: Teeth and Oropharynx as per pre-operative assessment

## 2019-08-06 NOTE — H&P (Signed)
Katherine Floyd  Location: [facility] Patient #: [id number] DOB: Jul 25, 1945 Married / Language: English / Race: White Female   History of Present Illness  The patient is a 74 year old female who presents with a complaint of Breast problems. We are asked to see the patient in consultation by Dr. Johnnette Gourd to evaluate her for a complex sclerosing lesion of the left breast. The patient is a 74 year old white female who recently went for a routine screening mammogram. At that time she was found to have a 8 mm area of calcification in the right breast and a 5 mm area of calcification in the left breast. Both of these were biopsied. The right side was completely benign. The left side did show a complex sclerosing lesion. She denies any breast pain or discharge from the nipple. She does have a family history significant for breast cancer in her mother and 3 cousins. She also apparently has some diastolic heart dysfunction and is followed by Dr. Pernell Dupre.  Her only other medical problem is asthma   Allergies PredniSONE *CORTICOSTEROIDS*  Rofecoxib *ANALGESICS - ANTI-INFLAMMATORY*  Sulfamethoxazole *SULFONAMIDES*  Sulfur (Antiseborrheic) *DERMATOLOGICALS*  Ampicillin *PENICILLINS*  Allergies Reconciled   Medication History  ALPRAZolam (0.5MG  Tablet, Oral) Active. Methocarbamol (750MG  Tablet, 1 (one) Tablet Oral three times daily, as needed, Taken starting 03/12/2017) Active. Ondansetron (4MG  Tablet, Oral) Active. Atorvastatin Calcium (10MG  Tablet, Oral) Active. Fluticasone Propionate (50MCG/ACT Suspension, Nasal) Active. Furosemide (40MG  Tablet, Oral) Active. Montelukast Sodium (10MG  Tablet, Oral) Active. Potassium Chloride Crys ER (20MEQ Tablet ER, Oral) Active. Symbicort (160-4.5MCG/ACT Aerosol, Inhalation) Active. Cetirizine HCl (10MG  Tablet, Oral daily) Active. Colace (100MG  Capsule, Oral) Active. Medications Reconciled    Review of  Systems General Not Present- Appetite Loss, Chills, Fatigue, Fever, Night Sweats, Weight Gain and Weight Loss. Note: All other systems negative (unless as noted in HPI & included Review of Systems) Skin Not Present- Change in Wart/Mole, Dryness, Hives, Jaundice, New Lesions, Non-Healing Wounds, Rash and Ulcer. HEENT Not Present- Earache, Hearing Loss, Hoarseness, Nose Bleed, Oral Ulcers, Ringing in the Ears, Seasonal Allergies, Sinus Pain, Sore Throat, Visual Disturbances, Wears glasses/contact lenses and Yellow Eyes. Respiratory Not Present- Bloody sputum, Chronic Cough, Difficulty Breathing, Snoring and Wheezing. Breast Not Present- Breast Mass, Breast Pain, Nipple Discharge and Skin Changes. Cardiovascular Not Present- Chest Pain, Difficulty Breathing Lying Down, Leg Cramps, Palpitations, Rapid Heart Rate, Shortness of Breath and Swelling of Extremities. Gastrointestinal Not Present- Abdominal Pain, Bloating, Bloody Stool, Change in Bowel Habits, Chronic diarrhea, Constipation, Difficulty Swallowing, Excessive gas, Gets full quickly at meals, Hemorrhoids, Indigestion, Nausea, Rectal Pain and Vomiting. Female Genitourinary Not Present- Frequency, Nocturia, Painful Urination, Pelvic Pain and Urgency. Musculoskeletal Not Present- Back Pain, Joint Pain, Joint Stiffness, Muscle Pain, Muscle Weakness and Swelling of Extremities. Neurological Not Present- Decreased Memory, Fainting, Headaches, Numbness, Seizures, Tingling, Tremor, Trouble walking and Weakness. Psychiatric Not Present- Anxiety, Bipolar, Change in Sleep Pattern, Depression, Fearful and Frequent crying. Endocrine Not Present- Cold Intolerance, Excessive Hunger, Hair Changes, Heat Intolerance, Hot flashes and New Diabetes. Hematology Not Present- Easy Bruising, Excessive bleeding, Gland problems, HIV and Persistent Infections.  Vitals  Weight: 153.6 lb Height: 61in Body Surface Area: 1.69 m Body Mass Index: 29.02 kg/m  Temp.:  39F(Tympanic)  Pulse: 77 (Regular)  BP: 112/72 (Sitting, Left Arm, Standard)       Physical Exam  General Mental Status-Alert. General Appearance-Consistent with stated age. Hydration-Well hydrated. Voice-Normal.  Head and Neck Head-normocephalic, atraumatic with no lesions or palpable masses. Trachea-midline. Thyroid  Gland Characteristics - normal size and consistency.  Eye Eyeball - Bilateral-Extraocular movements intact. Sclera/Conjunctiva - Bilateral-No scleral icterus.  Chest and Lung Exam Chest and lung exam reveals -quiet, even and easy respiratory effort with no use of accessory muscles and on auscultation, normal breath sounds, no adventitious sounds and normal vocal resonance. Inspection Chest Wall - Normal. Back - normal.  Breast Note: There is no palpable mass in either breast. There is no palpable axillary, supraclavicular, or cervical lymphadenopathy.   Cardiovascular Cardiovascular examination reveals -normal heart sounds, regular rate and rhythm with no murmurs and normal pedal pulses bilaterally.  Abdomen Inspection Inspection of the abdomen reveals - No Hernias. Skin - Scar - no surgical scars. Palpation/Percussion Palpation and Percussion of the abdomen reveal - Soft, Non Tender, No Rebound tenderness, No Rigidity (guarding) and No hepatosplenomegaly. Auscultation Auscultation of the abdomen reveals - Bowel sounds normal.  Neurologic Neurologic evaluation reveals -alert and oriented x 3 with no impairment of recent or remote memory. Mental Status-Normal.  Musculoskeletal Normal Exam - Left-Upper Extremity Strength Normal and Lower Extremity Strength Normal. Normal Exam - Right-Upper Extremity Strength Normal and Lower Extremity Strength Normal.  Lymphatic Head & Neck  General Head & Neck Lymphatics: Bilateral - Description - Normal. Axillary  General Axillary Region: Bilateral - Description - Normal.  Tenderness - Non Tender. Femoral & Inguinal  Generalized Femoral & Inguinal Lymphatics: Bilateral - Description - Normal. Tenderness - Non Tender.    Assessment & Plan  SCLEROSING ADENOSIS OF BREAST, LEFT (N60.22) Impression: The patient appears to have a 5 mm area of complex sclerosing lesion in the lateral aspect of the left breast. Because of its abnormal appearance and because there is a 5-10% chance of missing something more significant the recommendation is to have this area removed. She would also like to have this done. I have discussed with her in detail the risks and benefits of the operation as well as some of the technical aspects including the use of a radioactive seed for localization and she understands and wishes to proceed Current Plans Pt Education - Breast Diseases: discussed with patient and provided information.

## 2019-08-06 NOTE — Op Note (Signed)
08/06/2019  9:15 AM  PATIENT:  Katherine Floyd  74 y.o. female  PRE-OPERATIVE DIAGNOSIS:  LEFT BREAST COMPLEX SCLEROSING LESION  POST-OPERATIVE DIAGNOSIS:  LEFT BREAST COMPLEX SCLEROSING LESION  PROCEDURE:  Procedure(s): LEFT BREAST LUMPECTOMY WITH RADIOACTIVE SEED LOCALIZATION (Left)  SURGEON:  Surgeon(s) and Role:    Jovita Kussmaul, MD - Primary  PHYSICIAN ASSISTANT:   ASSISTANTS: none   ANESTHESIA:   local and general  EBL:  5 mL   BLOOD ADMINISTERED:none  DRAINS: none   LOCAL MEDICATIONS USED:  MARCAINE     SPECIMEN:  Source of Specimen:  left breast tissue  DISPOSITION OF SPECIMEN:  PATHOLOGY  COUNTS:  YES  TOURNIQUET:  * No tourniquets in log *  DICTATION: .Dragon Dictation   After informed consent was obtained the patient was brought to the operating room and placed in the supine position on the operating table.  After adequate induction of general anesthesia the patient's left breast was prepped with ChloraPrep, allowed to dry, and draped in usual sterile manner.  An appropriate timeout was performed.  Previously an I-125 seed was placed in the upper outer quadrant of the left breast to mark an area of complex sclerosing lesion.  The neoprobe was set to I-125 in the area of radioactivity was readily identified.  The area around this was infiltrated with quarter percent Marcaine.  A curvilinear incision was made along the outer edge of the areola of the left breast.  The incision was carried through the skin and subcutaneous tissue sharply with the electrocautery.  Dissection was then carried through the breast tissue towards the radioactive seed under the direction of the neoprobe.  Once I more closely approached the radioactive seed I then removed a circular portion of breast tissue sharply with the electrocautery around the radioactive seed while checking the area of radioactivity frequently.  Once the specimen was removed it was oriented with the appropriate paint  colors.  A specimen radiograph was obtained that showed the clip and seed to be near the center of the specimen.  The specimen was then sent to pathology for further evaluation.  Hemostasis was achieved using the Bovie electrocautery.  The wound was irrigated with saline and infiltrated with more quarter percent Marcaine.  The deep layer of the wound was then closed with layers of interrupted 3-0 Vicryl stitches.  The skin was closed with interrupted 4-0 Monocryl subcuticular stitches.  Dermabond dressings were applied.  The patient tolerated the procedure well.  At the end of the case all needle sponge and instrument counts were correct.  The patient was then awakened and taken to recovery in stable condition.  PLAN OF CARE: Discharge to home after PACU  PATIENT DISPOSITION:  PACU - hemodynamically stable.   Delay start of Pharmacological VTE agent (>24hrs) due to surgical blood loss or risk of bleeding: not applicable

## 2019-08-06 NOTE — Anesthesia Preprocedure Evaluation (Addendum)
Anesthesia Evaluation  Patient identified by MRN, date of birth, ID band Patient awake    Reviewed: Allergy & Precautions, NPO status , Patient's Chart, lab work & pertinent test results  History of Anesthesia Complications Negative for: history of anesthetic complications  Airway Mallampati: II  TM Distance: >3 FB Neck ROM: Full    Dental  (+) Teeth Intact, Dental Advisory Given   Pulmonary asthma , former smoker,    Pulmonary exam normal breath sounds clear to auscultation       Cardiovascular hypertension, Pt. on medications Normal cardiovascular exam+ dysrhythmias Atrial Fibrillation  Rhythm:Regular Rate:Normal     Neuro/Psych PSYCHIATRIC DISORDERS Anxiety negative neurological ROS     GI/Hepatic Neg liver ROS, GERD  Medicated and Controlled,  Endo/Other  negative endocrine ROS  Renal/GU negative Renal ROS     Musculoskeletal  (+) Arthritis , Osteoarthritis,    Abdominal   Peds  Hematology negative hematology ROS (+)   Anesthesia Other Findings Day of surgery medications reviewed with the patient.  LEFT BREAST COMPLEX SCLEROSING LESION  Reproductive/Obstetrics                            Anesthesia Physical Anesthesia Plan  ASA: II  Anesthesia Plan: General   Post-op Pain Management:    Induction: Intravenous  PONV Risk Score and Plan: 3 and Dexamethasone, Ondansetron and Treatment may vary due to age or medical condition  Airway Management Planned: LMA  Additional Equipment:   Intra-op Plan:   Post-operative Plan: Extubation in OR  Informed Consent: I have reviewed the patients History and Physical, chart, labs and discussed the procedure including the risks, benefits and alternatives for the proposed anesthesia with the patient or authorized representative who has indicated his/her understanding and acceptance.     Dental advisory given  Plan Discussed with:  CRNA  Anesthesia Plan Comments:         Anesthesia Quick Evaluation

## 2019-08-06 NOTE — Discharge Instructions (Signed)
No Tylenol until 1:45 PM on 08/06/2019      Post Anesthesia Home Care Instructions  Activity: Get plenty of rest for the remainder of the day. A responsible individual must stay with you for 24 hours following the procedure.  For the next 24 hours, DO NOT: -Drive a car -Paediatric nurse -Drink alcoholic beverages -Take any medication unless instructed by your physician -Make any legal decisions or sign important papers.  Meals: Start with liquid foods such as gelatin or soup. Progress to regular foods as tolerated. Avoid greasy, spicy, heavy foods. If nausea and/or vomiting occur, drink only clear liquids until the nausea and/or vomiting subsides. Call your physician if vomiting continues.  Special Instructions/Symptoms: Your throat may feel dry or sore from the anesthesia or the breathing tube placed in your throat during surgery. If this causes discomfort, gargle with warm salt water. The discomfort should disappear within 24 hours.  If you had a scopolamine patch placed behind your ear for the management of post- operative nausea and/or vomiting:  1. The medication in the patch is effective for 72 hours, after which it should be removed.  Wrap patch in a tissue and discard in the trash. Wash hands thoroughly with soap and water. 2. You may remove the patch earlier than 72 hours if you experience unpleasant side effects which may include dry mouth, dizziness or visual disturbances. 3. Avoid touching the patch. Wash your hands with soap and water after contact with the patch.

## 2019-08-06 NOTE — Transfer of Care (Signed)
Immediate Anesthesia Transfer of Care Note  Patient: Katherine Floyd  Procedure(s) Performed: LEFT BREAST LUMPECTOMY WITH RADIOACTIVE SEED LOCALIZATION (Left Breast)  Patient Location: PACU  Anesthesia Type:General  Level of Consciousness: awake, alert , oriented and patient cooperative  Airway & Oxygen Therapy: Patient Spontanous Breathing and Patient connected to face mask oxygen  Post-op Assessment: Report given to RN and Post -op Vital signs reviewed and stable  Post vital signs: Reviewed and stable  Last Vitals:  Vitals Value Taken Time  BP 109/64 08/06/19 0918  Temp    Pulse 70 08/06/19 0919  Resp 9 08/06/19 0919  SpO2 100 % 08/06/19 0919  Vitals shown include unvalidated device data.  Last Pain:  Vitals:   08/06/19 0741  TempSrc: Tympanic  PainSc: 0-No pain         Complications: No apparent anesthesia complications

## 2019-08-06 NOTE — Anesthesia Postprocedure Evaluation (Signed)
Anesthesia Post Note  Patient: Katherine Floyd  Procedure(s) Performed: LEFT BREAST LUMPECTOMY WITH RADIOACTIVE SEED LOCALIZATION (Left Breast)     Patient location during evaluation: PACU Anesthesia Type: General Level of consciousness: awake and alert Pain management: pain level controlled Vital Signs Assessment: post-procedure vital signs reviewed and stable Respiratory status: spontaneous breathing, nonlabored ventilation and respiratory function stable Cardiovascular status: blood pressure returned to baseline and stable Postop Assessment: no apparent nausea or vomiting Anesthetic complications: no    Last Vitals:  Vitals:   08/06/19 0930 08/06/19 0945  BP: 121/68 121/63  Pulse: 66 63  Resp: 17 19  Temp:    SpO2: 100% 98%    Last Pain:  Vitals:   08/06/19 0945  TempSrc:   PainSc: 0-No pain                 Catalina Gravel

## 2019-08-08 ENCOUNTER — Encounter: Payer: Self-pay | Admitting: *Deleted

## 2019-08-09 LAB — SURGICAL PATHOLOGY

## 2019-09-28 ENCOUNTER — Other Ambulatory Visit: Payer: Self-pay

## 2019-09-28 MED ORDER — FUROSEMIDE 40 MG PO TABS: 40.0000 mg | ORAL_TABLET | Freq: Every day | ORAL | 0 refills | Status: DC

## 2019-09-28 MED ORDER — ATORVASTATIN CALCIUM 10 MG PO TABS: ORAL_TABLET | ORAL | 0 refills | Status: DC

## 2019-10-26 ENCOUNTER — Telehealth: Payer: Self-pay | Admitting: Interventional Cardiology

## 2019-10-26 NOTE — Telephone Encounter (Signed)
Received phone message from pt to change her pharmacy to [facility].  Been changed.

## 2019-10-26 NOTE — Telephone Encounter (Signed)
   Pt called she said her new pharmacy is [facility] mail order

## 2019-10-28 ENCOUNTER — Telehealth: Payer: Self-pay | Admitting: Interventional Cardiology

## 2019-10-28 ENCOUNTER — Other Ambulatory Visit: Payer: Self-pay

## 2019-10-28 MED ORDER — POTASSIUM CHLORIDE CRYS ER 20 MEQ PO TBCR: 20.0000 meq | EXTENDED_RELEASE_TABLET | Freq: Every day | ORAL | 0 refills | Status: DC

## 2019-10-28 MED ORDER — FUROSEMIDE 40 MG PO TABS: 40.0000 mg | ORAL_TABLET | Freq: Every day | ORAL | 0 refills | Status: DC

## 2019-10-28 MED ORDER — ATORVASTATIN CALCIUM 10 MG PO TABS: ORAL_TABLET | ORAL | 0 refills | Status: DC

## 2019-10-28 NOTE — Telephone Encounter (Signed)
Sent to [facility] in error

## 2019-10-28 NOTE — Telephone Encounter (Signed)
Called and spoke to patient. Refills sent in to [facility]. Appointment made for patient to see Dr. Tamala Julian on 9/1.

## 2019-10-28 NOTE — Telephone Encounter (Signed)
Patient calling to update her pharmacy. She states the following medications should be sent to [facility] for future refills. Other medications can be sent to [facility]:   atorvastatin (LIPITOR) 10 MG tablet furosemide (LASIX) 40 MG tablet potassium chloride SA (KLOR-CON) 20 MEQ tablet   [facility] Address: [address] [place] [address] Phone: [phone]

## 2019-10-28 NOTE — Telephone Encounter (Signed)
No message needed °

## 2019-12-24 NOTE — Progress Notes (Signed)
Cardiology Office Note:    Date:  12/29/2019   ID:  Farrel Gordon, DOB Jan 12, 1946, MRN [id number]  PCP:  Aletha Halim., PA-C  Cardiologist:  Sinclair Grooms, MD   Referring MD: Aletha Halim., PA-C   Chief Complaint  Patient presents with  . Atrial Fibrillation  . Congestive Heart Failure    History of Present Illness:    Katherine Floyd is a 74 y.o. female with a hx of paroxysmal atrial fibrillation, diastolic heart failure, and essential hypertension.  Danne suffered [place] in January 2021.  Luckily she recovered without significant difficulty.  She has received Materna vaccination x2.  She denies cardiac complaints.  She specifically denies orthopnea, PND, exertional angina, palpitations, transient neurological symptoms, and edema.  Past Medical History:  Diagnosis Date  . Anxiety   . Arthritis    with fracture right great toe 08/30/11 from "stepping wrong"  . Asthma   . Dysrhythmia 06/21/2013   NEW ONSET ATRIAL FIBRILATION/RVR  . GERD (gastroesophageal reflux disease)   . Hypotension   . Kidney stone   . Osteoporosis   . Polyp of colon, adenomatous    recurrent  . Urinary tract infection 08/29/11   Cipro per PCP- states is resolving    Past Surgical History:  Procedure Laterality Date  . ABDOMINAL HYSTERECTOMY  1984  . BACK SURGERY  1997-1998   fell on ice and snow  . BREAST LUMPECTOMY WITH RADIOACTIVE SEED LOCALIZATION Left 08/06/2019   Procedure: LEFT BREAST LUMPECTOMY WITH RADIOACTIVE SEED LOCALIZATION;  Surgeon: Jovita Kussmaul, MD;  Location: [facility];  Service: General;  Laterality: Left;  . BUNIONECTOMY  2002   right foot  . EYE SURGERY  2000   cataract extraction with IOL  . HAND SURGERY  1994   cyst removed  . HEMICOLECTOMY  09/06/11  . INCISIONAL HERNIA REPAIR N/A 01/31/2017   Procedure: LAPAROSCOPIC INCISIONAL HERNIA REPAIR WITH MESH;  Surgeon: Jackolyn Confer, MD;  Location: [facility];  Service: General;  Laterality:  N/A;  . INSERTION OF MESH N/A 01/31/2017   Procedure: INSERTION OF MESH;  Surgeon: Jackolyn Confer, MD;  Location: [facility];  Service: General;  Laterality: N/A;  . [place]  . POLYPECTOMY    . SHOULDER SURGERY  2007   right   . TONSILLECTOMY  age of 40  . uretheral dilitation      Current Medications: Current Meds  Medication Sig  . acetaminophen (TYLENOL) 650 MG CR tablet Take 1,300 mg by mouth every 8 (eight) hours as needed for pain.  Marland Kitchen albuterol (PROVENTIL) (2.5 MG/3ML) 0.083% nebulizer solution Take 2.5 mg by nebulization every 6 (six) hours as needed for wheezing or shortness of breath.   . ALPRAZolam (XANAX) 0.5 MG tablet Take 0.25 mg by mouth 2 (two) times daily as needed for anxiety. Anxiety  . aspirin EC 81 MG tablet Take 1 tablet (81 mg total) by mouth daily.  Marland Kitchen atorvastatin (LIPITOR) 10 MG tablet TAKE 1 TABLET BY MOUTH AT BEDTIME  . B Complex Vitamins (VITAMIN B-COMPLEX) TABS Take 1 tablet by mouth daily.  . budesonide-formoterol (SYMBICORT) 160-4.5 MCG/ACT inhaler Inhale 2 puffs into the lungs 2 (two) times daily.  . Calcium Carbonate-Vitamin D (CALCIUM-CARB 600 + D PO) Take 2 tablets by mouth daily.  . carboxymethylcellulose (REFRESH PLUS) 0.5 % SOLN Place 1 drop into both eyes 2 (two) times daily as needed. Dry eyes  . cetirizine (ZYRTEC) 10 MG tablet Take 10 mg  by mouth daily.  . Cyanocobalamin (B-12-SL SL) Place 1,500 mcg under the tongue daily.  . fluticasone (FLONASE) 50 MCG/ACT nasal spray Place 2 sprays into both nostrils 2 (two) times daily as needed for allergies.   . furosemide (LASIX) 40 MG tablet TAKE 1 TABLET BY MOUTH EVERY DAY  . LINZESS 145 MCG CAPS capsule Take 1 capsule by mouth daily.  . Lutein 6 MG CAPS Take 6 mg by mouth daily.  . montelukast (SINGULAIR) 10 MG tablet Take 10 mg by mouth at bedtime.   . Multiple Vitamins-Minerals (MULTIVITAMIN WITH MINERALS) tablet Take 1 tablet by mouth daily.  Marland Kitchen omeprazole (PRILOSEC) 20 MG capsule Take 20 mg  by mouth daily.  . potassium chloride SA (KLOR-CON) 20 MEQ tablet Take 1 tablet (20 mEq total) by mouth daily. Please keep appointment with Dr. Tamala Julian in September  . vitamin E 400 UNIT capsule Take 400 Units by mouth daily.  . [DISCONTINUED] HYDROcodone-acetaminophen (NORCO/VICODIN) 5-325 MG tablet Take 1-2 tablets by mouth every 6 (six) hours as needed for moderate pain or severe pain.  . [DISCONTINUED] linaCLOtide (LINZESS PO) Take by mouth.     Allergies:   Prednisone, Rofecoxib, Sulfur, Ampicillin, and Doxycycline   Social History   Socioeconomic History  . Marital status: Married    Spouse name: Not on file  . Number of children: Not on file  . Years of education: Not on file  . Highest education level: Not on file  Occupational History  . Not on file  Tobacco Use  . Smoking status: Former Smoker    Packs/day: 1.50    Years: 10.00    Pack years: 15.00    Quit date: 09/01/1988    Years since quitting: 31.3  . Smokeless tobacco: Never Used  Vaping Use  . Vaping Use: Never used  Substance and Sexual Activity  . Alcohol use: No  . Drug use: No  . Sexual activity: Not on file  Other Topics Concern  . Not on file  Social History Narrative  . Not on file   Social Determinants of Health   Financial Resource Strain:   . Difficulty of Paying Living Expenses: Not on file  Food Insecurity:   . Worried About Charity fundraiser in the Last Year: Not on file  . Ran Out of Food in the Last Year: Not on file  Transportation Needs:   . Lack of Transportation (Medical): Not on file  . Lack of Transportation (Non-Medical): Not on file  Physical Activity:   . Days of Exercise per Week: Not on file  . Minutes of Exercise per Session: Not on file  Stress:   . Feeling of Stress : Not on file  Social Connections:   . Frequency of Communication with Friends and Family: Not on file  . Frequency of Social Gatherings with Friends and Family: Not on file  . Attends Religious Services:  Not on file  . Active Member of Clubs or Organizations: Not on file  . Attends Archivist Meetings: Not on file  . Marital Status: Not on file     Family History: The patient's family history includes Cancer in her cousin, maternal uncle, and mother.  ROS:   Please see the history of present illness.    She has had sinus infections.  She follow-up she was having a sinus infection when she developed COVID-19.  All other systems reviewed and are negative.  EKGs/Labs/Other Studies Reviewed:    The following studies were  reviewed today: No recent or new imaging.  EKG:  EKG relatively low voltage, sinus rhythm, with horizontal axis.  When compared to 12/17/2018, the axis is more rightward.  There is nonspecific T wave flattening  Recent Labs: No results found for requested labs within last 8760 hours.  Recent Lipid Panel    Component Value Date/Time   CHOL 179 01/11/2015 0829   TRIG 66.0 01/11/2015 0829   HDL 85.90 01/11/2015 0829   CHOLHDL 2 01/11/2015 0829   VLDL 13.2 01/11/2015 0829   LDLCALC 80 01/11/2015 0829    Physical Exam:    VS:  BP 126/72   Pulse 65   Ht 5\' 1"  (1.549 m)   Wt 156 lb 6.4 oz (70.9 kg)   SpO2 98%   BMI 29.55 kg/m     Wt Readings from Last 3 Encounters:  12/29/19 156 lb 6.4 oz (70.9 kg)  08/06/19 149 lb 7.6 oz (67.8 kg)  12/16/18 157 lb 3.2 oz (71.3 kg)     GEN: Moderate obesity. No acute distress HEENT: Normal NECK: No JVD. LYMPHATICS: No lymphadenopathy CARDIAC:  RRR without murmur, gallop, or edema. VASCULAR:  Normal Pulses. No bruits. RESPIRATORY:  Clear to auscultation without rales, wheezing or rhonchi  ABDOMEN: Soft, non-tender, non-distended, No pulsatile mass, MUSCULOSKELETAL: No deformity  SKIN: Warm and dry NEUROLOGIC:  Alert and oriented x 3 PSYCHIATRIC:  Normal affect   ASSESSMENT:    1. Paroxysmal atrial fibrillation (HCC)   2. Chronic diastolic HF (heart failure) ([place])   3. Essential hypertension   4.  Nonspecific abnormal electrocardiogram (ECG) (EKG)   5. Educated about COVID-19 virus infection    PLAN:    In order of problems listed above:  1. No recurrence of atrial fibrillation based upon symptoms.  Not on anticoagulation therapy. 2. She developed diastolic heart failure when in atrial fibrillation several years ago.  No difficulty since that time. 3. Blood pressure is well controlled.  She should continue furosemide 40 mg/day, K. Dur 20 mEq/day. 4. EKG today is very stable with horizontal axis and nonspecific T wave flattening 5. Covid vaccine and medication is being applied by thepatient.   Medication Adjustments/Labs and Tests Ordered: Current medicines are reviewed at length with the patient today.  Concerns regarding medicines are outlined above.  Orders Placed This Encounter  Procedures  . EKG 12-Lead   No orders of the defined types were placed in this encounter.   Patient Instructions  Medication Instructions:  Your physician recommends that you continue on your current medications as directed. Please refer to the Current Medication list given to you today.  *If you need a refill on your cardiac medications before your next appointment, please call your pharmacy*   Lab Work: None If you have labs (blood work) drawn today and your tests are completely normal, you will receive your results only by: Marland Kitchen MyChart Message (if you have MyChart) OR . A paper copy in the mail If you have any lab test that is abnormal or we need to change your treatment, we will call you to review the results.   Testing/Procedures: None   Follow-Up: At [facility], you and your health needs are our priority.  As part of our continuing mission to provide you with exceptional heart care, we have created designated Provider Care Teams.  These Care Teams include your primary Cardiologist (physician) and Advanced Practice Providers (APPs -  Physician Assistants and Nurse Practitioners) who  all work together to provide you with  the care you need, when you need it.  We recommend signing up for the patient portal called "MyChart".  Sign up information is provided on this After Visit Summary.  MyChart is used to connect with patients for Virtual Visits (Telemedicine).  Patients are able to view lab/test results, encounter notes, upcoming appointments, etc.  Non-urgent messages can be sent to your provider as well.   To learn more about what you can do with MyChart, go to [email or web address].    Your next appointment:   12 month(s)  The format for your next appointment:   In Person  Provider:   You may see Sinclair Grooms, MD or one of the following Advanced Practice Providers on your designated Care Team:    Truitt Merle, NP  Cecilie Kicks, NP  Kathyrn Drown, NP    Other Instructions      Signed, Sinclair Grooms, MD  12/29/2019 10:48 AM    [place]

## 2019-12-27 ENCOUNTER — Other Ambulatory Visit: Payer: Self-pay | Admitting: Interventional Cardiology

## 2019-12-29 ENCOUNTER — Other Ambulatory Visit: Payer: Self-pay

## 2019-12-29 ENCOUNTER — Ambulatory Visit: Payer: Medicare HMO | Admitting: Interventional Cardiology

## 2019-12-29 ENCOUNTER — Encounter: Payer: Self-pay | Admitting: Interventional Cardiology

## 2019-12-29 VITALS — BP 126/72 | HR 65 | Ht 61.0 in | Wt 156.4 lb

## 2019-12-29 DIAGNOSIS — Z7189 Other specified counseling: Secondary | ICD-10-CM

## 2019-12-29 DIAGNOSIS — I10 Essential (primary) hypertension: Secondary | ICD-10-CM | POA: Diagnosis not present

## 2019-12-29 DIAGNOSIS — R9431 Abnormal electrocardiogram [ECG] [EKG]: Secondary | ICD-10-CM

## 2019-12-29 DIAGNOSIS — I480 Paroxysmal atrial fibrillation: Secondary | ICD-10-CM | POA: Diagnosis not present

## 2019-12-29 DIAGNOSIS — I5032 Chronic diastolic (congestive) heart failure: Secondary | ICD-10-CM | POA: Diagnosis not present

## 2019-12-29 NOTE — Patient Instructions (Signed)

## 2020-01-10 ENCOUNTER — Other Ambulatory Visit: Payer: Self-pay | Admitting: Interventional Cardiology

## 2020-01-11 MED ORDER — POTASSIUM CHLORIDE CRYS ER 20 MEQ PO TBCR: 20.0000 meq | EXTENDED_RELEASE_TABLET | Freq: Every day | ORAL | 3 refills | Status: DC

## 2020-01-31 ENCOUNTER — Other Ambulatory Visit: Payer: Self-pay | Admitting: Interventional Cardiology

## 2020-06-23 ENCOUNTER — Telehealth: Payer: Self-pay

## 2020-06-23 NOTE — Telephone Encounter (Signed)
NOTES ON FILE FROM [facility] [phone], SENT REFERRAL TO SCHEDULING °

## 2020-07-16 IMAGING — CR LEFT HAND - COMPLETE 3+ VIEW
3 series · 3 of 3 positions shown · non-contrast
Comparison: None.

CLINICAL DATA: Left hand pain after fall.

EXAM:
LEFT HAND - COMPLETE 3+ VIEW

[x hand pa left]
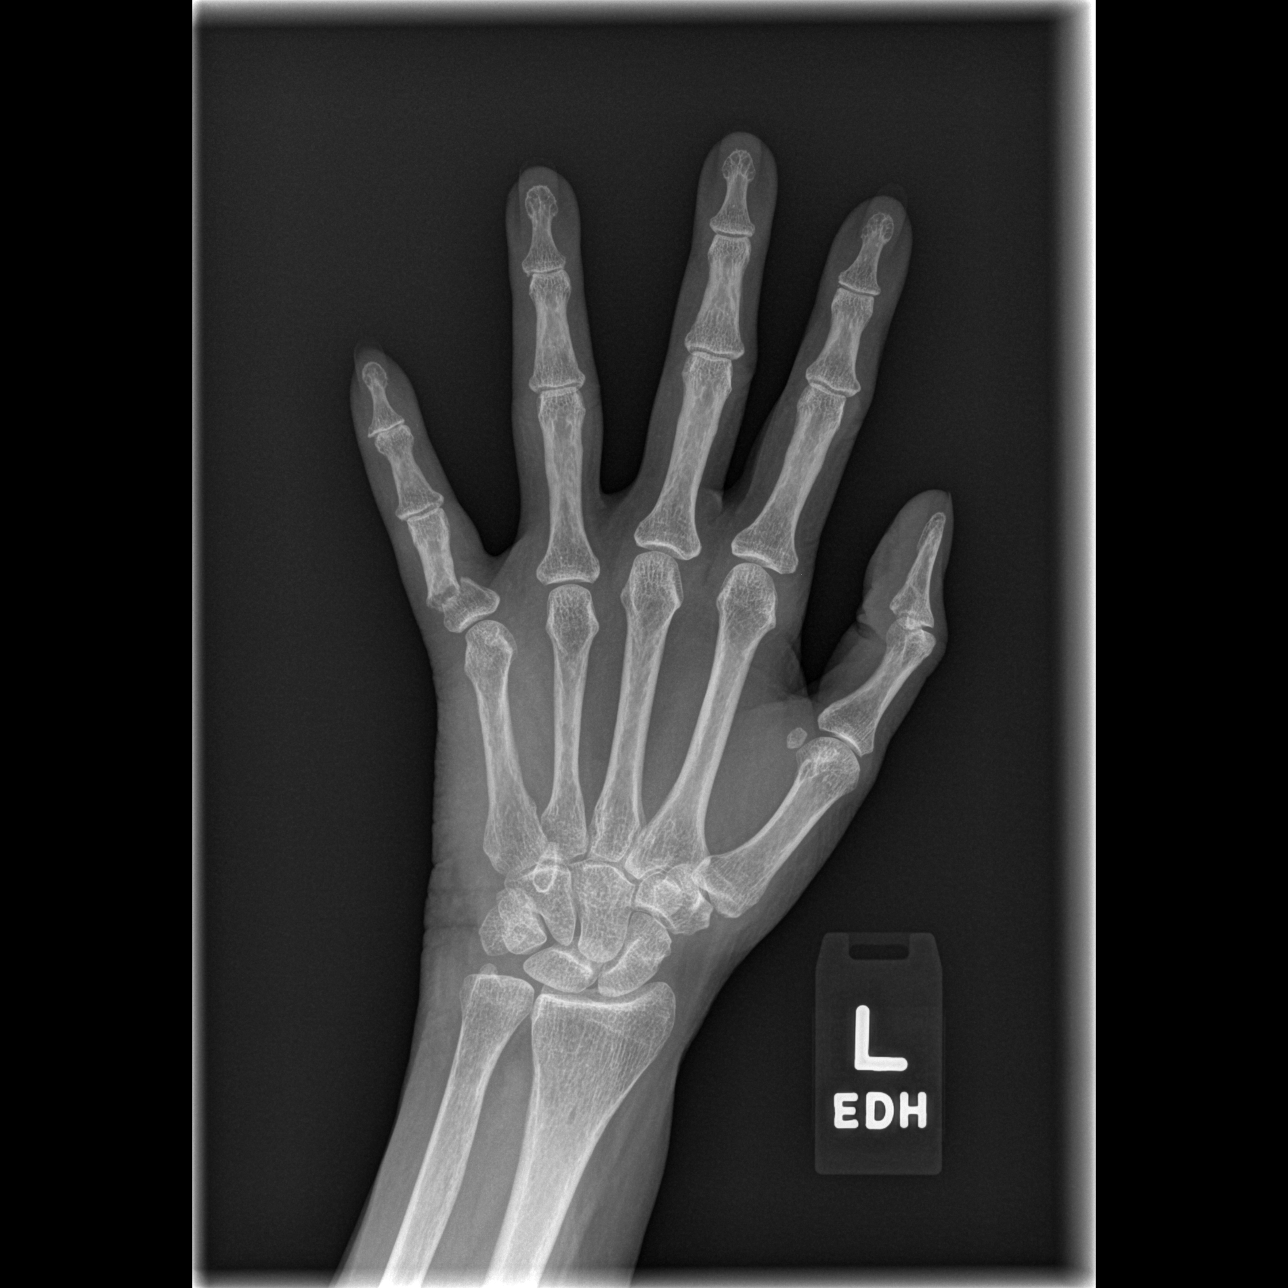

[x hand oblique left]
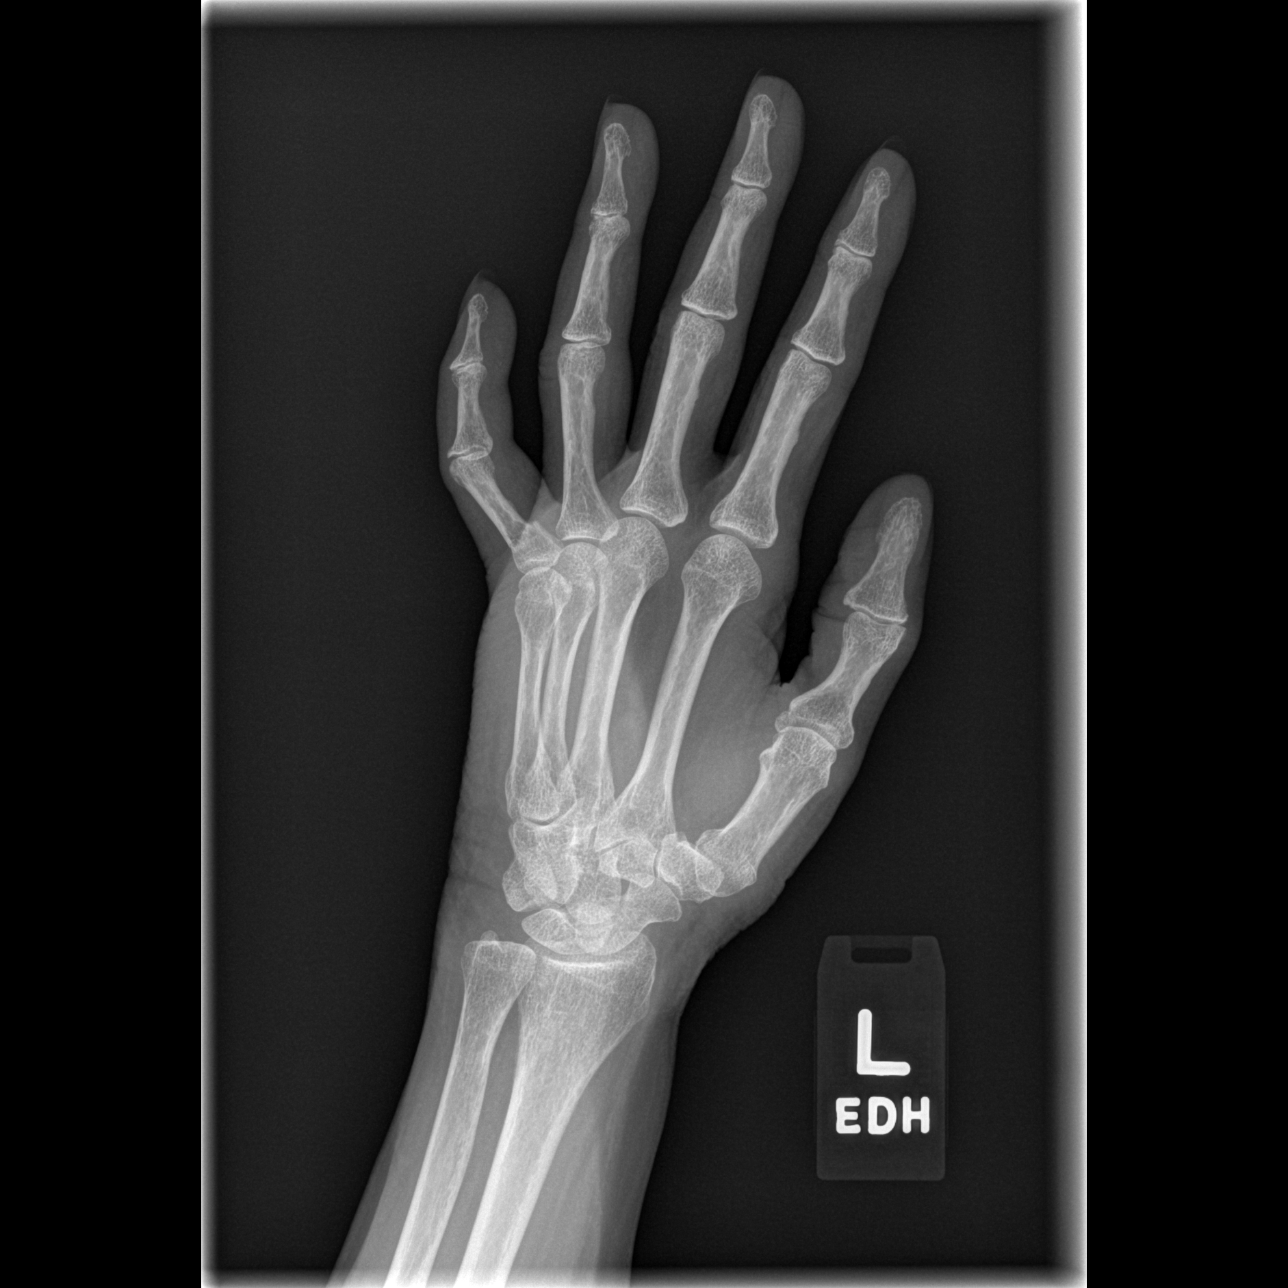

[x hand lat left]
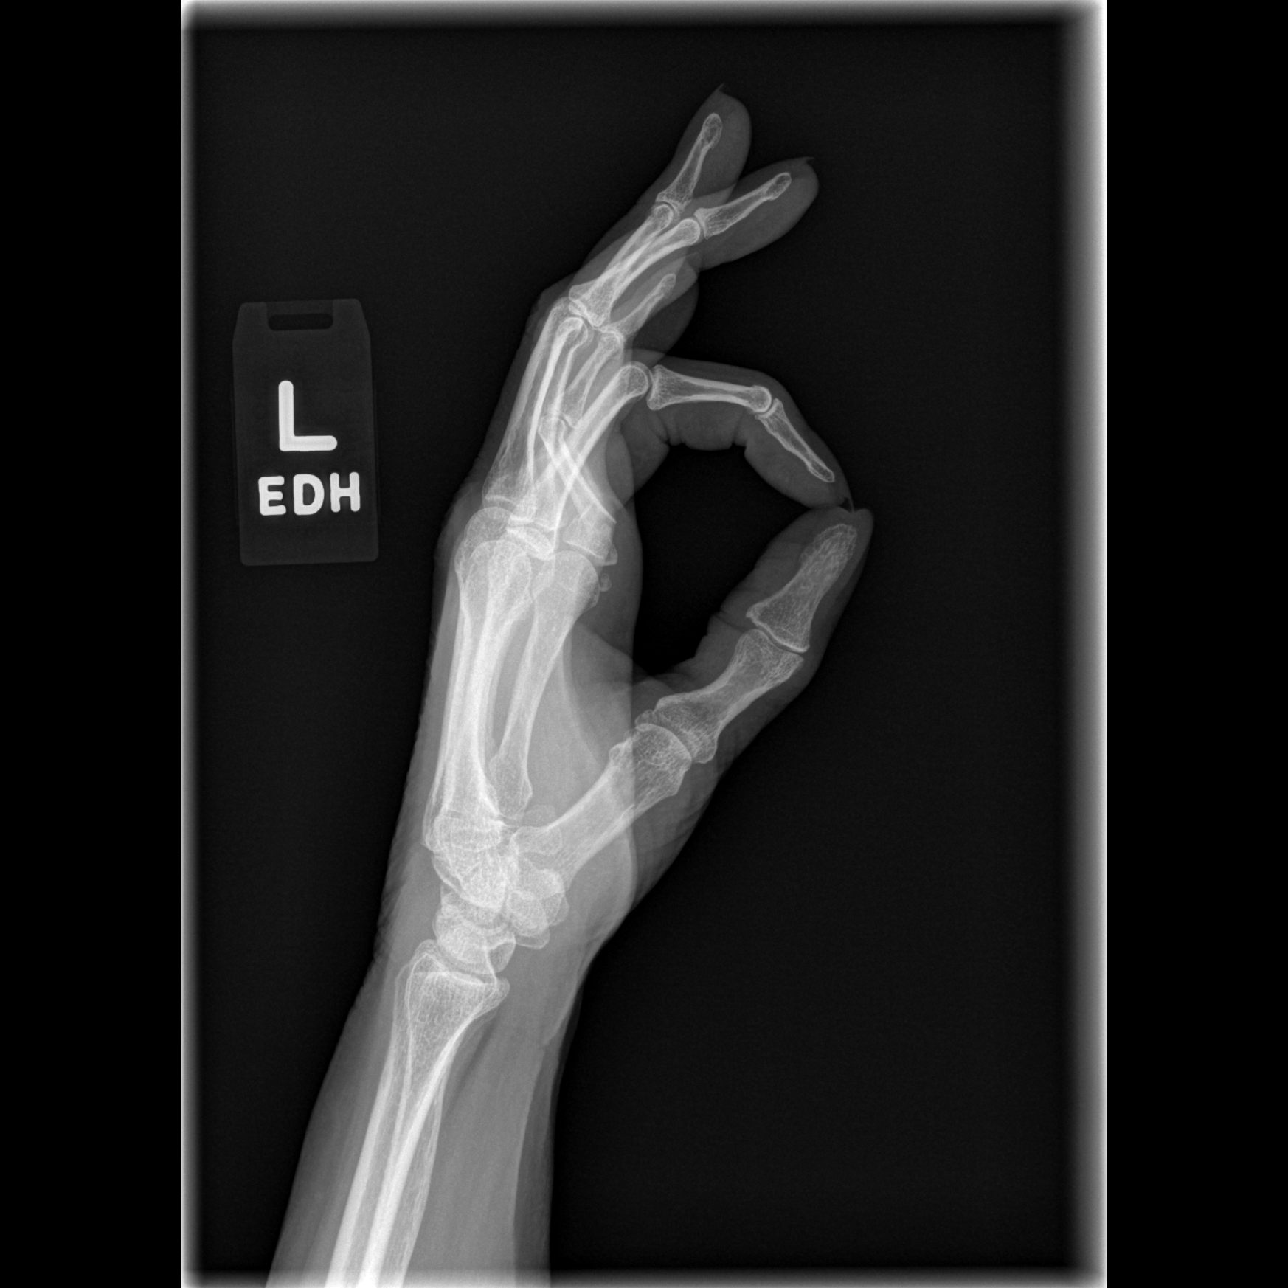

[3 of 3 positions shown; findings below may reference images not displayed]

FINDINGS: Moderately angulated fracture is seen involving the fifth proximal
phalanx. No other bony abnormality is noted. Joint spaces are
unremarkable.
IMPRESSION: Moderately displaced and angulated fifth proximal phalangeal
fracture is noted.

## 2020-08-21 ENCOUNTER — Encounter (HOSPITAL_COMMUNITY): Payer: Self-pay

## 2020-12-07 ENCOUNTER — Other Ambulatory Visit: Payer: Self-pay | Admitting: Interventional Cardiology

## 2020-12-25 ENCOUNTER — Other Ambulatory Visit: Payer: Self-pay | Admitting: Family Medicine

## 2020-12-25 DIAGNOSIS — R109 Unspecified abdominal pain: Secondary | ICD-10-CM

## 2020-12-25 DIAGNOSIS — R634 Abnormal weight loss: Secondary | ICD-10-CM

## 2020-12-26 ENCOUNTER — Ambulatory Visit
Admission: RE | Admit: 2020-12-26 | Discharge: 2020-12-26 | Disposition: A | Discharge status: Home / Self Care | Payer: Medicare HMO | Source: Ambulatory Visit | Attending: Family Medicine | Admitting: Family Medicine

## 2020-12-26 DIAGNOSIS — R109 Unspecified abdominal pain: Secondary | ICD-10-CM

## 2020-12-26 DIAGNOSIS — R634 Abnormal weight loss: Secondary | ICD-10-CM

## 2021-01-05 ENCOUNTER — Other Ambulatory Visit: Payer: Self-pay | Admitting: Gastroenterology

## 2021-01-05 DIAGNOSIS — R1033 Periumbilical pain: Secondary | ICD-10-CM

## 2021-01-08 ENCOUNTER — Ambulatory Visit
Admission: RE | Admit: 2021-01-08 | Discharge: 2021-01-08 | Disposition: A | Discharge status: Home / Self Care | Payer: Medicare HMO | Source: Ambulatory Visit | Attending: Gastroenterology | Admitting: Gastroenterology

## 2021-01-08 DIAGNOSIS — R1033 Periumbilical pain: Secondary | ICD-10-CM

## 2021-01-08 MED ORDER — IOPAMIDOL (ISOVUE-300) INJECTION 61%
100.0000 mL | Freq: Once | INTRAVENOUS | Status: AC | PRN
  Administered 2021-01-08: 100 mL via INTRAVENOUS

## 2021-01-23 NOTE — Progress Notes (Signed)
Cardiology Office Note:    Date:  01/24/2021   ID:  Katherine Floyd, DOB January 05, 1946, MRN [id number]  PCP:  Aletha Halim., PA-C  Cardiologist:  Sinclair Grooms, MD   Referring MD: Aletha Halim., PA-C   Chief Complaint  Patient presents with   Atrial Fibrillation     History of Present Illness:    Katherine Floyd is a 75 y.o. female with a hx of paroxysmal atrial fibrillation, hyperlipidemia, chronic diastolic heart failure, and essential hypertension.   Overall doing well.  She has had no recurrences of atrial fibrillation have been symptomatic.  She is not on antiarrhythmic therapy.  She is physically active although her exercise routine has been interrupted by the illness of her husband who has a leaky valve and has congestive heart failure.  She still remains active around her house.  She does house chores.  She has not been limited in the activities that she can do.  Past Medical History:  Diagnosis Date   Anxiety    Arthritis    with fracture right great toe 08/30/11 from "stepping wrong"   Asthma    Dysrhythmia 06/21/2013   NEW ONSET ATRIAL FIBRILATION/RVR   GERD (gastroesophageal reflux disease)    Hypotension    Kidney stone    Osteoporosis    Polyp of colon, adenomatous    recurrent   Urinary tract infection 08/29/11   Cipro per PCP- states is resolving    Past Surgical History:  Procedure Laterality Date   ABDOMINAL HYSTERECTOMY  1984   BACK SURGERY  1997-1998   fell on ice and snow   BREAST LUMPECTOMY WITH RADIOACTIVE SEED LOCALIZATION Left 08/06/2019   Procedure: LEFT BREAST LUMPECTOMY WITH RADIOACTIVE SEED LOCALIZATION;  Surgeon: Jovita Kussmaul, MD;  Location: [place] [facility];  Service: General;  Laterality: Left;   BUNIONECTOMY  2002   right foot   EYE SURGERY  2000   cataract extraction with IOL   HAND SURGERY  1994   cyst removed   HEMICOLECTOMY  09/06/11   INCISIONAL HERNIA REPAIR N/A 01/31/2017   Procedure: [place];  Surgeon: Jackolyn Confer, MD;  Location: [facility];  Service: General;  Laterality: N/A;   INSERTION OF MESH N/A 01/31/2017   Procedure: INSERTION OF MESH;  Surgeon: Jackolyn Confer, MD;  Location: [facility];  Service: General;  Laterality: N/A;   [place]  2007   right    TONSILLECTOMY  age of 84   uretheral dilitation      Current Medications: Current Meds  Medication Sig   acetaminophen (TYLENOL) 650 MG CR tablet Take 1,300 mg by mouth every 8 (eight) hours as needed for pain.   albuterol (PROVENTIL) (2.5 MG/3ML) 0.083% nebulizer solution Take 2.5 mg by nebulization every 6 (six) hours as needed for wheezing or shortness of breath.    ALPRAZolam (XANAX) 0.5 MG tablet Take 0.25 mg by mouth 2 (two) times daily as needed for anxiety. Anxiety   aspirin EC 81 MG tablet Take 1 tablet (81 mg total) by mouth daily.   atorvastatin (LIPITOR) 10 MG tablet TAKE 1 TABLET AT BEDTIME   B Complex Vitamins (VITAMIN B-COMPLEX) TABS Take 1 tablet by mouth daily.   budesonide-formoterol (SYMBICORT) 160-4.5 MCG/ACT inhaler Inhale 2 puffs into the lungs 2 (two) times daily.   Calcium Carbonate-Vitamin D (CALCIUM-CARB 600 + D PO) Take 2 tablets by  mouth daily.   carboxymethylcellulose (REFRESH PLUS) 0.5 % SOLN Place 1 drop into both eyes 2 (two) times daily as needed. Dry eyes   cetirizine (ZYRTEC) 10 MG tablet Take 10 mg by mouth daily.   Cyanocobalamin (B-12-SL SL) Place 1,500 mcg under the tongue daily.   fluticasone (FLONASE) 50 MCG/ACT nasal spray Place 2 sprays into both nostrils 2 (two) times daily as needed for allergies.    furosemide (LASIX) 40 MG tablet TAKE 1 TABLET EVERY DAY   Lutein 6 MG CAPS Take 6 mg by mouth daily.   montelukast (SINGULAIR) 10 MG tablet Take 10 mg by mouth at bedtime.    Multiple Vitamins-Minerals (MULTIVITAMIN WITH MINERALS) tablet Take 1 tablet by mouth daily.   omeprazole (PRILOSEC) 20 MG  capsule Take 20 mg by mouth daily.   potassium chloride SA (KLOR-CON) 20 MEQ tablet TAKE 1 TABLET (20 MEQ TOTAL) BY MOUTH DAILY.   vitamin E 400 UNIT capsule Take 400 Units by mouth daily.     Allergies:   Elemental sulfur, Prednisone, Rofecoxib, Ampicillin, and Doxycycline   Social History   Socioeconomic History   Marital status: Married    Spouse name: Not on file   Number of children: Not on file   Years of education: Not on file   Highest education level: Not on file  Occupational History   Not on file  Tobacco Use   Smoking status: Former    Packs/day: 1.50    Years: 10.00    Pack years: 15.00    Types: Cigarettes    Quit date: 09/01/1988    Years since quitting: 32.4   Smokeless tobacco: Never  Vaping Use   Vaping Use: Never used  Substance and Sexual Activity   Alcohol use: No   Drug use: No   Sexual activity: Not on file  Other Topics Concern   Not on file  Social History Narrative   Not on file   Social Determinants of Health   Financial Resource Strain: Not on file  Food Insecurity: Not on file  Transportation Needs: Not on file  Physical Activity: Not on file  Stress: Not on file  Social Connections: Not on file     Family History: The patient's family history includes Cancer in her cousin, maternal uncle, and mother.  ROS:   Please see the history of present illness.    Denies chest pain.  Has a lot of stress and concerned about her husband's condition.  Otherwise doing okay.  All other systems reviewed and are negative.  EKGs/Labs/Other Studies Reviewed:    The following studies were reviewed today: No new data  EKG:  EKG sinus rhythm, low voltage, left axis deviation when compared to the prior tracing of 12/2019,  Recent Labs: No results found for requested labs within last 8760 hours.  Recent Lipid Panel    Component Value Date/Time   CHOL 179 01/11/2015 0829   TRIG 66.0 01/11/2015 0829   HDL 85.90 01/11/2015 0829   CHOLHDL 2  01/11/2015 0829   VLDL 13.2 01/11/2015 0829   LDLCALC 80 01/11/2015 0829    Physical Exam:    VS:  BP 122/68   Pulse 60   Ht 5\' 1"  (1.549 m)   Wt 139 lb (63 kg)   SpO2 98%   BMI 26.26 kg/m     Wt Readings from Last 3 Encounters:  01/24/21 139 lb (63 kg)  12/29/19 156 lb 6.4 oz (70.9 kg)  08/06/19 149 lb 7.6 oz (  67.8 kg)     GEN: Mildly overweight. No acute distress HEENT: Normal NECK: No JVD. LYMPHATICS: No lymphadenopathy CARDIAC: No murmur. RRR no gallop, or edema. VASCULAR:  Normal Pulses. No bruits. RESPIRATORY:  Clear to auscultation without rales, wheezing or rhonchi  ABDOMEN: Soft, non-tender, non-distended, No pulsatile mass, MUSCULOSKELETAL: No deformity  SKIN: Warm and dry NEUROLOGIC:  Alert and oriented x 3 PSYCHIATRIC:  Normal affect   ASSESSMENT:    1. Paroxysmal atrial fibrillation (HCC)   2. Chronic diastolic HF (heart failure) ([place])   3. Essential hypertension   4. Nonspecific abnormal electrocardiogram (ECG) (EKG)   5. Hyperlipidemia, mixed    PLAN:    In order of problems listed above:  Clinically doing well.  Continue to monitor for palpitations/recurrent A. fib. No evidence of volume overload.  Continue furosemide 40 mg daily and Klor-Con 20 mEq/day.  She has never been on an SGLT2.  This would be a consideration going forward if symptoms develop. Blood pressure control is excellent and only requiring the diuretic. See above. Continue Lipitor 10 mg/day.  Laboratory data done recently demonstrated an LDL of 63.  Triglycerides 69 and total cholesterol 168.  Liver panel was unremarkable on 12/19/2020.  [place] 150 minutes of moderate activity per week.  Clinical follow-up in 1 year.  Notify us if palpitations.   Medication Adjustments/Labs and Tests Ordered: Current medicines are reviewed at length with the patient today.  Concerns regarding medicines are outlined above.  Orders Placed This Encounter  Procedures   EKG 12-Lead    No orders  of the defined types were placed in this encounter.   Patient Instructions  Medication Instructions:  Your physician recommends that you continue on your current medications as directed. Please refer to the Current Medication list given to you today.  *If you need a refill on your cardiac medications before your next appointment, please call your pharmacy*   Lab Work: None If you have labs (blood work) drawn today and your tests are completely normal, you will receive your results only by: [place] (if you have MyChart) OR A paper copy in the mail If you have any lab test that is abnormal or we need to change your treatment, we will call you to review the results.   Testing/Procedures: None   Follow-Up: At [facility], you and your health needs are our priority.  As part of our continuing mission to provide you with exceptional heart care, we have created designated Provider Care Teams.  These Care Teams include your primary Cardiologist (physician) and Advanced Practice Providers (APPs -  Physician Assistants and Nurse Practitioners) who all work together to provide you with the care you need, when you need it.  We recommend signing up for the patient portal called "MyChart".  Sign up information is provided on this After Visit Summary.  MyChart is used to connect with patients for Virtual Visits (Telemedicine).  Patients are able to view lab/test results, encounter notes, upcoming appointments, etc.  Non-urgent messages can be sent to your provider as well.   To learn more about what you can do with MyChart, go to [email or web address].    Your next appointment:   1 year(s)  The format for your next appointment:   In Person  Provider:   You may see Sinclair Grooms, MD or one of the following Advanced Practice Providers on your designated Care Team:   Cecilie Kicks, NP   Other Instructions     Signed, Mallie Mussel  Carlye Grippe, MD  01/24/2021 9:06 AM    [place]

## 2021-01-24 ENCOUNTER — Encounter: Payer: Self-pay | Admitting: Interventional Cardiology

## 2021-01-24 ENCOUNTER — Ambulatory Visit: Payer: Medicare HMO | Admitting: Interventional Cardiology

## 2021-01-24 ENCOUNTER — Other Ambulatory Visit: Payer: Self-pay

## 2021-01-24 VITALS — BP 122/68 | HR 60 | Ht 61.0 in | Wt 139.0 lb

## 2021-01-24 DIAGNOSIS — I480 Paroxysmal atrial fibrillation: Secondary | ICD-10-CM | POA: Diagnosis not present

## 2021-01-24 DIAGNOSIS — R9431 Abnormal electrocardiogram [ECG] [EKG]: Secondary | ICD-10-CM | POA: Diagnosis not present

## 2021-01-24 DIAGNOSIS — I5032 Chronic diastolic (congestive) heart failure: Secondary | ICD-10-CM | POA: Diagnosis not present

## 2021-01-24 DIAGNOSIS — E782 Mixed hyperlipidemia: Secondary | ICD-10-CM

## 2021-01-24 DIAGNOSIS — I10 Essential (primary) hypertension: Secondary | ICD-10-CM

## 2021-01-24 NOTE — Patient Instructions (Signed)

## 2021-11-29 ENCOUNTER — Other Ambulatory Visit: Payer: Self-pay | Admitting: Interventional Cardiology

## 2022-01-27 NOTE — Progress Notes (Signed)
Cardiology Office Note:    Date:  01/28/2022   ID:  Katherine Floyd, DOB 01/21/1946, MRN [id number]  PCP:  Aletha Halim., PA-C  Cardiologist:  Sinclair Grooms, MD   Referring MD: Aletha Halim., PA-C   Chief Complaint  Patient presents with   Atrial Fibrillation   Hyperlipidemia   Congestive Heart Failure    History of Present Illness:    Katherine Floyd is a 76 y.o. female with a hx of paroxysmal atrial fibrillation, hyperlipidemia, chronic diastolic heart failure, and essential hypertension.   Her husband had valve replacement and coronary bypass surgery earlier this year.  He is recovered.  They are walking together and eating the same diet.  She has had no recurrence of atrial fibrillation or atrial flutter.  She denies shortness of breath.  She is compliant with her medication regimen.  Past Medical History:  Diagnosis Date   Anxiety    Arthritis    with fracture right great toe 08/30/11 from "stepping wrong"   Asthma    Dysrhythmia 06/21/2013   NEW ONSET ATRIAL FIBRILATION/RVR   GERD (gastroesophageal reflux disease)    Hypotension    Kidney stone    Osteoporosis    Polyp of colon, adenomatous    recurrent   Urinary tract infection 08/29/11   Cipro per PCP- states is resolving    Past Surgical History:  Procedure Laterality Date   ABDOMINAL HYSTERECTOMY  1984   BACK SURGERY  1997-1998   fell on ice and snow   BREAST LUMPECTOMY WITH RADIOACTIVE SEED LOCALIZATION Left 08/06/2019   Procedure: LEFT BREAST LUMPECTOMY WITH RADIOACTIVE SEED LOCALIZATION;  Surgeon: Jovita Kussmaul, MD;  Location: [place] [place];  Service: General;  Laterality: Left;   BUNIONECTOMY  2002   right foot   EYE SURGERY  2000   cataract extraction with IOL   HAND SURGERY  1994   cyst removed   HEMICOLECTOMY  09/06/11   INCISIONAL HERNIA REPAIR N/A 01/31/2017   Procedure: [place];  Surgeon: Jackolyn Confer, MD;  [facility];   Service: General;  Laterality: N/A;   INSERTION OF MESH N/A 01/31/2017   Procedure: INSERTION OF MESH;  Surgeon: Jackolyn Confer, MD;  Location[facility];  Service: General;  Laterality: N/A;   [place] SURGERY  2007   right    TONSILLECTOMY  age of 56   uretheral dilitation      Current Medications: Current Meds  Medication Sig   acetaminophen (TYLENOL) 500 MG tablet Take 1 mg by mouth as needed.   albuterol (PROVENTIL) (2.5 MG/3ML) 0.083% nebulizer solution Take 2.5 mg by nebulization every 6 (six) hours as needed for wheezing or shortness of breath.    ALPRAZolam (XANAX) 0.5 MG tablet Take 0.25 mg by mouth 2 (two) times daily as needed for anxiety. Anxiety   aspirin EC 81 MG tablet Take 1 tablet (81 mg total) by mouth daily.   atorvastatin (LIPITOR) 10 MG tablet TAKE 1 TABLET AT BEDTIME   B Complex Vitamins (VITAMIN B-COMPLEX) TABS Take 1 tablet by mouth daily.   budesonide-formoterol (SYMBICORT) 160-4.5 MCG/ACT inhaler Inhale 2 puffs into the lungs 2 (two) times daily.   Calcium Carbonate-Vitamin D (CALCIUM-CARB 600 + D PO) Take 2 tablets by mouth daily.   carboxymethylcellulose (REFRESH PLUS) 0.5 % SOLN Place 1 drop into both eyes 2 (two) times daily as needed. Dry eyes  cetirizine (ZYRTEC) 10 MG tablet Take 10 mg by mouth daily.   fluticasone (FLONASE) 50 MCG/ACT nasal spray Place 2 sprays into both nostrils 2 (two) times daily as needed for allergies.    furosemide (LASIX) 40 MG tablet TAKE 1 TABLET EVERY DAY   Lutein 6 MG CAPS Take 6 mg by mouth daily.   montelukast (SINGULAIR) 10 MG tablet Take 10 mg by mouth at bedtime.    omeprazole (PRILOSEC) 20 MG capsule Take 20 mg by mouth daily.   potassium chloride SA (KLOR-CON M) 20 MEQ tablet TAKE 1 TABLET EVERY DAY   vitamin E 400 UNIT capsule Take 400 Units by mouth daily.     Allergies:   Elemental sulfur, Linaclotide, Prednisone, Rofecoxib, Ampicillin, Dicyclomine, and Doxycycline   Social  History   Socioeconomic History   Marital status: Married    Spouse name: Not on file   Number of children: Not on file   Years of education: Not on file   Highest education level: Not on file  Occupational History   Not on file  Tobacco Use   Smoking status: Former    Packs/day: 1.50    Years: 10.00    Total pack years: 15.00    Types: Cigarettes    Quit date: 09/01/1988    Years since quitting: 33.4   Smokeless tobacco: Never  Vaping Use   Vaping Use: Never used  Substance and Sexual Activity   Alcohol use: No   Drug use: No   Sexual activity: Not on file  Other Topics Concern   Not on file  Social History Narrative   Not on file   Social Determinants of Health   Financial Resource Strain: Not on file  Food Insecurity: Not on file  Transportation Needs: Not on file  Physical Activity: Not on file  Stress: Not on file  Social Connections: Not on file     Family History: The patient's family history includes Cancer in her cousin, maternal uncle, and mother.  ROS:   Please see the history of present illness.    No orthopnea.  Occasional left ankle edema.  All other systems reviewed and are negative.    EKGs/Labs/Other Studies Reviewed:    The following studies were reviewed today: 2D Doppler echocardiogram 01/08/2018: Study Conclusions   - Left ventricle: The cavity size was normal. Systolic function was    normal. The estimated ejection fraction was in the range of 60%    to 65%. Wall motion was normal; there were no regional wall    motion abnormalities. Doppler parameters are consistent with    abnormal left ventricular relaxation (grade 1 diastolic    dysfunction).  - Aortic valve: There was mild regurgitation.  - Mitral valve: There was trivial regurgitation.  - Tricuspid valve: There was trivial regurgitation.   Impressions:   - Compared to the prior study, there has been no significant    interval change.   EKG:  EKG normal sinus rhythm with poor  R wave progression V1 through V4.  Recent Labs: No results found for requested labs within last 365 days.  Recent Lipid Panel    Component Value Date/Time   CHOL 179 01/11/2015 0829   TRIG 66.0 01/11/2015 0829   HDL 85.90 01/11/2015 0829   CHOLHDL 2 01/11/2015 0829   VLDL 13.2 01/11/2015 0829   LDLCALC 80 01/11/2015 0829    Physical Exam:    VS:  BP (!) 118/58   Pulse 61   Ht 5'  1" (1.549 m)   Wt 126 lb (57.2 kg)   SpO2 99%   BMI 23.81 kg/m     Wt Readings from Last 3 Encounters:  01/28/22 126 lb (57.2 kg)  01/24/21 139 lb (63 kg)  12/29/19 156 lb 6.4 oz (70.9 kg)     GEN:. No acute distress HEENT: Normal NECK: No JVD. LYMPHATICS: No lymphadenopathy CARDIAC: No murmur. RRR no gallop, or edema. VASCULAR:  Normal Pulses. No bruits. RESPIRATORY:  Clear to auscultation without rales, wheezing or rhonchi  ABDOMEN: Soft, non-tender, non-distended, No pulsatile mass, MUSCULOSKELETAL: No deformity  SKIN: Warm and dry NEUROLOGIC:  Alert and oriented x 3 PSYCHIATRIC:  Normal affect   ASSESSMENT:    1. Paroxysmal atrial fibrillation (HCC)   2. Chronic diastolic HF (heart failure) ([place])   3. Essential hypertension   4. Hyperlipidemia, mixed    PLAN:    In order of problems listed above:  No symptoms/recurrences.  Not requiring anticoagulation. Continue to monitor for volume excess.  Continue furosemide 40 mg daily.  Consider switch to SGLT2 therapy with discontinuation of loop diuretic. Continue furosemide and Klor-Con. Continue statin therapy at current intensity since most recent laboratory data included an LDL of 63 and triglyceride of 54 when performed in September 2023.  Guideline directed therapy for left ventricular diastolic dysfunction: Angiotensin receptor-neprilysin inhibitor (ARNI)-Entresto;.  SGLT-2 agents -  Dapagliflozin [place]) or Empagliflozin ([place]).These therapies have been shown to improve clinical outcomes including reduction of  rehospitalization, survival, and acute heart failure.  Medications have not been adjusted for diastolic heart failure since acute diastolic heart failure in the setting of atrial fibrillation.  Current guidelines were not applicable at that time.  She is doing so well on current therapy, no changes are made today.  1 year follow-up with new cardiologist.    Medication Adjustments/Labs and Tests Ordered: Current medicines are reviewed at length with the patient today.  Concerns regarding medicines are outlined above.  Orders Placed This Encounter  Procedures   EKG 12-Lead   No orders of the defined types were placed in this encounter.   Patient Instructions  Medication Instructions:  Your physician recommends that you continue on your current medications as directed. Please refer to the Current Medication list given to you today.  *If you need a refill on your cardiac medications before your next appointment, please call your pharmacy*  Lab Work: NONE  Testing/Procedures: NONE  Follow-Up: At [facility], you and your health needs are our priority.  As part of our continuing mission to provide you with exceptional heart care, we have created designated Provider Care Teams.  These Care Teams include your primary Cardiologist (physician) and Advanced Practice Providers (APPs -  Physician Assistants and Nurse Practitioners) who all work together to provide you with the care you need, when you need it.  Your next appointment:   1 year(s)  The format for your next appointment:   In Person  Provider:   Sinclair Grooms, MD    Important Information About Sugar         Signed, Sinclair Grooms, MD  01/28/2022 9:32 AM    [place]

## 2022-01-28 ENCOUNTER — Encounter: Payer: Self-pay | Admitting: Interventional Cardiology

## 2022-01-28 ENCOUNTER — Ambulatory Visit: Payer: Medicare HMO | Attending: Interventional Cardiology | Admitting: Interventional Cardiology

## 2022-01-28 ENCOUNTER — Telehealth: Payer: Self-pay | Admitting: Interventional Cardiology

## 2022-01-28 VITALS — BP 118/58 | HR 61 | Ht 61.0 in | Wt 126.0 lb

## 2022-01-28 DIAGNOSIS — I10 Essential (primary) hypertension: Secondary | ICD-10-CM | POA: Diagnosis not present

## 2022-01-28 DIAGNOSIS — E782 Mixed hyperlipidemia: Secondary | ICD-10-CM

## 2022-01-28 DIAGNOSIS — I5032 Chronic diastolic (congestive) heart failure: Secondary | ICD-10-CM | POA: Diagnosis not present

## 2022-01-28 DIAGNOSIS — I480 Paroxysmal atrial fibrillation: Secondary | ICD-10-CM | POA: Diagnosis not present

## 2022-01-28 NOTE — Telephone Encounter (Signed)
Patient was seen this morning by Dr. Terrill Mohr understands he will be retiring soon and would like to know who he recommends for future follow-ups. Please advise.

## 2022-01-28 NOTE — Patient Instructions (Signed)
Medication Instructions:  Your physician recommends that you continue on your current medications as directed. Please refer to the Current Medication list given to you today.  *If you need a refill on your cardiac medications before your next appointment, please call your pharmacy*  Lab Work: NONE  Testing/Procedures: NONE  Follow-Up: At [facility], you and your health needs are our priority.  As part of our continuing mission to provide you with exceptional heart care, we have created designated Provider Care Teams.  These Care Teams include your primary Cardiologist (physician) and Advanced Practice Providers (APPs -  Physician Assistants and Nurse Practitioners) who all work together to provide you with the care you need, when you need it.  Your next appointment:   1 year(s)  The format for your next appointment:   In Person  Provider:   Henry W Smith III, MD    Important Information About Sugar       

## 2022-02-06 NOTE — Telephone Encounter (Signed)
Returned call to patient.  Informed her that Dr. Tamala Julian did not mention a specific provider to follow-up with after he retires. Advised patient to look on the [facility] website to view provider profiles and patient reviews to assist her in making a decision on who to follow-up with in September 2024.  Patient verbalized understanding and expressed appreciation for follow-up.

## 2022-04-24 ENCOUNTER — Other Ambulatory Visit: Payer: Self-pay | Admitting: Interventional Cardiology

## 2022-09-18 ENCOUNTER — Telehealth: Payer: Self-pay

## 2022-09-18 NOTE — Telephone Encounter (Signed)
Pt is scheduled for tele 09/20/22 at 2:40pm. Med rec and consent done

## 2022-09-18 NOTE — Telephone Encounter (Signed)
   Pre-operative Risk Assessment    Patient Name: Katherine Floyd  DOB: Jun 23, 1945 MRN: [id number]      Request for Surgical Clearance    Procedure:   EGD and colonoscopy  Date of Surgery:  Clearance 09/27/22                                 Surgeon:  DR. Loreta Ave Surgeon's Group or Practice Name:  [facility] Phone number:  [phone] Fax number:  [phone]   Type of Clearance Requested:   - Medical  - Pharmacy:  Hold Aspirin -will need instructions on when/if to hold aspirin   Type of Anesthesia:   Propofol   Additional requests/questions:    Signed, Zada Finders   09/18/2022, 11:24 AM

## 2022-09-18 NOTE — Telephone Encounter (Signed)
Pt is scheduled for tele 09/20/22 at 2:40pm. Med rec and consent done     Patient Consent for Virtual Visit        Katherine Floyd has provided verbal consent on 09/18/2022 for a virtual visit (video or telephone).   CONSENT FOR VIRTUAL VISIT FOR:  Katherine Floyd  By participating in this virtual visit I agree to the following:  I hereby voluntarily request, consent and authorize [facility] and its employed or contracted physicians, physician assistants, nurse practitioners or other licensed health care professionals (the Practitioner), to provide me with telemedicine health care services (the "Services") as deemed necessary by the treating Practitioner. I acknowledge and consent to receive the Services by the Practitioner via telemedicine. I understand that the telemedicine visit will involve communicating with the Practitioner through live audiovisual communication technology and the disclosure of certain medical information by electronic transmission. I acknowledge that I have been given the opportunity to request an in-person assessment or other available alternative prior to the telemedicine visit and am voluntarily participating in the telemedicine visit.  I understand that I have the right to withhold or withdraw my consent to the use of telemedicine in the course of my care at any time, without affecting my right to future care or treatment, and that the Practitioner or I may terminate the telemedicine visit at any time. I understand that I have the right to inspect all information obtained and/or recorded in the course of the telemedicine visit and may receive copies of available information for a reasonable fee.  I understand that some of the potential risks of receiving the Services via telemedicine include:  Delay or interruption in medical evaluation due to technological equipment failure or disruption; Information transmitted may not be sufficient (e.g. poor resolution  of images) to allow for appropriate medical decision making by the Practitioner; and/or  In rare instances, security protocols could fail, causing a breach of personal health information.  Furthermore, I acknowledge that it is my responsibility to provide information about my medical history, conditions and care that is complete and accurate to the best of my ability. I acknowledge that Practitioner's advice, recommendations, and/or decision may be based on factors not within their control, such as incomplete or inaccurate data provided by me or distortions of diagnostic images or specimens that may result from electronic transmissions. I understand that the practice of medicine is not an exact science and that Practitioner makes no warranties or guarantees regarding treatment outcomes. I acknowledge that a copy of this consent can be made available to me via my patient portal [facility] MyChart), or I can request a printed copy by calling the office of  [facility].    I understand that my insurance will be billed for this visit.   I have read or had this consent read to me. I understand the contents of this consent, which adequately explains the benefits and risks of the Services being provided via telemedicine.  I have been provided ample opportunity to ask questions regarding this consent and the Services and have had my questions answered to my satisfaction. I give my informed consent for the services to be provided through the use of telemedicine in my medical care

## 2022-09-18 NOTE — Telephone Encounter (Signed)
   Name: Katherine Floyd  DOB: September 29, 1945  MRN: [id number]  Primary Cardiologist: Lesleigh Noe, MD (Inactive)   Preoperative team, please contact this patient and set up a phone call appointment for further preoperative risk assessment. Please obtain consent and complete medication review. Thank you for your help.  I confirm that guidance regarding antiplatelet and oral anticoagulation therapy has been completed and, if necessary, noted below.  Per office protocol, if patient is without any new symptoms or concerns at the time of their virtual visit, she may hold Aspirin for 5-7 days prior to procedure. Please resume Aspirin as soon as possible postprocedure, at the discretion of the surgeon.    Joylene Grapes, NP 09/18/2022, 11:42 AM [facility]

## 2022-09-20 ENCOUNTER — Ambulatory Visit: Payer: Medicare HMO | Attending: Cardiovascular Disease

## 2022-09-20 DIAGNOSIS — Z01810 Encounter for preprocedural cardiovascular examination: Secondary | ICD-10-CM

## 2022-09-20 NOTE — Progress Notes (Signed)
Virtual Visit via Telephone Note   Because of Mekah Koelsch Paternostro's co-morbid illnesses, she is at least at moderate risk for complications without adequate follow up.  This format is felt to be most appropriate for this patient at this time.  The patient did not have access to video technology/had technical difficulties with video requiring transitioning to audio format only (telephone).  All issues noted in this document were discussed and addressed.  No physical exam could be performed with this format.  Please refer to the patient's chart for her consent to telehealth for [facility].  Evaluation Performed:  Preoperative cardiovascular risk assessment _____________   Date:  09/20/2022   Patient ID:  Katherine Floyd, DOB June 26, 1945, MRN [id number] Patient Location:  Home Provider location:   Office  Primary Care Provider:  Richmond Campbell., PA-C Primary Cardiologist:  Lesleigh Noe, MD (Inactive)  Chief Complaint / Patient Profile   77 y.o. y/o female with a h/o PAF, HLD, CHF, HTN who is pending EGD and colonoscopy and presents today for telephonic preoperative cardiovascular risk assessment.  History of Present Illness    Katherine Floyd is a 77 y.o. female who presents via audio/video conferencing for a telehealth visit today.  Pt was last seen in cardiology clinic on 01/28/2022 by Dr. Katrinka Blazing.  At that time KYMBERLEE MACHOVEC was doing well with no recurrence of atrial fibrillation or atrial flutter. The patient is now pending procedure as outlined above. Since her last visit, she has been doing well with no new cardiac complaints.  She is staying very active and walks and does all of her ADLs without any difficulties.  She denies any tachycardia or atrial fibrillation episodes since her previous visit.  She denies chest pain, shortness of breath, lower extremity edema, fatigue, palpitations, melena, hematuria, hemoptysis, diaphoresis, weakness, presyncope, syncope,  orthopnea, and PND.    Past Medical History    Past Medical History:  Diagnosis Date   Anxiety    Arthritis    with fracture right great toe 08/30/11 from "stepping wrong"   Asthma    Dysrhythmia 06/21/2013   NEW ONSET ATRIAL FIBRILATION/RVR   GERD (gastroesophageal reflux disease)    Hypotension    Kidney stone    Osteoporosis    Polyp of colon, adenomatous    recurrent   Urinary tract infection 08/29/11   Cipro per PCP- states is resolving   Past Surgical History:  Procedure Laterality Date   ABDOMINAL HYSTERECTOMY  1984   BACK SURGERY  1997-1998   fell on ice and snow   BREAST LUMPECTOMY WITH RADIOACTIVE SEED LOCALIZATION Left 08/06/2019   Procedure: LEFT BREAST LUMPECTOMY WITH RADIOACTIVE SEED LOCALIZATION;  Surgeon: Griselda Miner, MD;  Location: [facility];  Service: General;  Laterality: Left;   BUNIONECTOMY  2002   right foot   EYE SURGERY  2000   cataract extraction with IOL   HAND SURGERY  1994   cyst removed   HEMICOLECTOMY  09/06/11   INCISIONAL HERNIA REPAIR N/A 01/31/2017   Procedure: LAPAROSCOPIC INCISIONAL HERNIA REPAIR WITH MESH;  Surgeon: Avel Peace, MD;  Location: [facility];  Service: General;  Laterality: N/A;   INSERTION OF MESH N/A 01/31/2017   Procedure: INSERTION OF MESH;  Surgeon: Avel Peace, MD;  Location: [facility];  Service: General;  Laterality: N/A;   KNEE SURGERY  1990   POLYPECTOMY     SHOULDER SURGERY  2007   right    TONSILLECTOMY  age of 79   uretheral dilitation      Allergies  Allergies  Allergen Reactions   Elemental Sulfur Itching   Linaclotide     Other reaction(s): Other (See Comments)  Diarrhea    Prednisone Other (See Comments)    Patient states that 20 mgs of prednisone feel loopy.   Rofecoxib Other (See Comments)    Unknown   Ampicillin Itching, Rash and Other (See Comments)    Unknown   Dicyclomine Rash   Doxycycline Rash    Home Medications    Prior to Admission medications   Medication Sig  Start Date End Date Taking? Authorizing Provider  acetaminophen (TYLENOL) 500 MG tablet Take 1 mg by mouth as needed.    [provider]  albuterol (PROVENTIL) (2.5 MG/3ML) 0.083% nebulizer solution Take 2.5 mg by nebulization every 6 (six) hours as needed for wheezing or shortness of breath.     [provider]  ALPRAZolam Prudy Feeler) 0.5 MG tablet Take 0.25 mg by mouth 2 (two) times daily as needed for anxiety. Anxiety 06/17/11   [provider]  aspirin EC 81 MG tablet Take 1 tablet (81 mg total) by mouth daily. 04/18/15   Lyn Records, MD  atorvastatin (LIPITOR) 10 MG tablet TAKE 1 TABLET AT BEDTIME (NEED MD APPOINTMENT) 04/24/22   Lyn Records, MD  B Complex Vitamins (VITAMIN B-COMPLEX) TABS Take 1 tablet by mouth daily.    [provider]  budesonide-formoterol (SYMBICORT) 160-4.5 MCG/ACT inhaler Inhale 2 puffs into the lungs 2 (two) times daily. 07/09/16   [provider]  Calcium Carbonate-Vitamin D (CALCIUM-CARB 600 + D PO) Take 2 tablets by mouth daily.    [provider]  carboxymethylcellulose (REFRESH PLUS) 0.5 % SOLN Place 1 drop into both eyes 2 (two) times daily as needed. Dry eyes    [provider]  cetirizine (ZYRTEC) 10 MG tablet Take 10 mg by mouth daily.    [provider]  fluticasone (FLONASE) 50 MCG/ACT nasal spray Place 2 sprays into both nostrils 2 (two) times daily as needed for allergies.  05/22/16   [provider]  furosemide (LASIX) 40 MG tablet TAKE 1 TABLET EVERY DAY (NEED MD APPOINTMENT) 04/24/22   Lyn Records, MD  Lutein 6 MG CAPS Take 6 mg by mouth daily.    [provider]  montelukast (SINGULAIR) 10 MG tablet Take 10 mg by mouth at bedtime.  07/17/11   [provider]  omeprazole (PRILOSEC) 20 MG capsule Take 20 mg by mouth daily.    [provider]  potassium chloride SA (KLOR-CON M) 20 MEQ tablet TAKE 1 TABLET EVERY DAY (NEED MD APPOINTMENT) 04/24/22    Lyn Records, MD  vitamin E 400 UNIT capsule Take 400 Units by mouth daily.    [provider]    Physical Exam    Vital Signs:  ALIXANDREA STANCATO does not have vital signs available for review today.  Given telephonic nature of communication, physical exam is limited. AAOx3. NAD. Normal affect.  Speech and respirations are unlabored.  Accessory Clinical Findings    None  Assessment & Plan    1.  Preoperative Cardiovascular Risk Assessment:  Patient's RCRI score is 0.9%  The patient affirms she has been doing well without any new cardiac symptoms. They are able to achieve 7 METS without cardiac limitations. Therefore, based on ACC/AHA guidelines, the patient would be at acceptable risk for the planned procedure without further cardiovascular testing. The  patient was advised that if she develops new symptoms prior to surgery to contact our office to arrange for a follow-up visit, and she verbalized understanding.   The patient was advised that if she develops new symptoms prior to surgery to contact our office to arrange for a follow-up visit, and she verbalized understanding.  Patient advised to hold aspirin 5 to 7 days prior to her procedure and should restart aspirin postprocedure when surgically safe and advised by performing provider.  A copy of this note will be routed to requesting surgeon.  Time:   Today, I have spent 8 minutes with the patient with telehealth technology discussing medical history, symptoms, and management plan.     Napoleon Form, Leodis Rains, NP  09/20/2022, 7:22 AM

## 2023-01-08 ENCOUNTER — Other Ambulatory Visit: Payer: Self-pay | Admitting: Gastroenterology

## 2023-01-08 ENCOUNTER — Other Ambulatory Visit: Payer: Self-pay | Admitting: *Deleted

## 2023-01-08 ENCOUNTER — Ambulatory Visit
Admission: RE | Admit: 2023-01-08 | Discharge: 2023-01-08 | Disposition: A | Discharge status: Home / Self Care | Payer: Medicare HMO | Source: Ambulatory Visit | Attending: Gastroenterology | Admitting: Gastroenterology

## 2023-01-08 DIAGNOSIS — I8393 Asymptomatic varicose veins of bilateral lower extremities: Secondary | ICD-10-CM

## 2023-01-08 DIAGNOSIS — K5900 Constipation, unspecified: Secondary | ICD-10-CM

## 2023-01-16 ENCOUNTER — Ambulatory Visit: Payer: Medicare HMO | Admitting: Physician Assistant

## 2023-01-16 ENCOUNTER — Ambulatory Visit (HOSPITAL_COMMUNITY)
Admission: RE | Admit: 2023-01-16 | Discharge: 2023-01-16 | Disposition: A | Discharge status: Home / Self Care | Payer: Medicare HMO | Source: Ambulatory Visit | Attending: Vascular Surgery | Admitting: Vascular Surgery

## 2023-01-16 VITALS — BP 121/68 | HR 58 | Temp 97.7°F | Wt 112.0 lb

## 2023-01-16 DIAGNOSIS — I8393 Asymptomatic varicose veins of bilateral lower extremities: Secondary | ICD-10-CM | POA: Diagnosis present

## 2023-01-16 DIAGNOSIS — I83811 Varicose veins of right lower extremities with pain: Secondary | ICD-10-CM | POA: Diagnosis not present

## 2023-01-16 NOTE — Progress Notes (Addendum)
[facility]           [facility] [place]  History and Physical   Katherine Floyd is a 77 y.o. female who presents today accompanied by her sister with leg swelling and a painful varicosity on the right lateral leg.    Pt states that she does get some swelling in her legs after being up and about.  She has a varicose vein on the lateral aspect of the right leg below the knee that she states is painful after she has been standing while cooking or canning for long periods of time.  She has never had hx of DVT.  She does not have skin color changes. She states that an aunt has hx of varicose veins.  She does have some knee high compression but has a hard time wearing them when it is hot outside.    She has hx of CHF, PAF.    The pt is on a statin for cholesterol management.  The pt is on a daily aspirin.   Other AC:  none The pt is on diuretic for hypertension.   The pt is not on medication for diabetes.   Tobacco hx:  former  Pt does not have family hx of AAA.  Past Medical History:  Diagnosis Date   Anxiety    Arthritis    with fracture right great toe 08/30/11 from "stepping wrong"   Asthma    Dysrhythmia 06/21/2013   NEW ONSET ATRIAL FIBRILATION/RVR   GERD (gastroesophageal reflux disease)    Hypotension    Kidney stone    Osteoporosis    Polyp of colon, adenomatous    recurrent   Urinary tract infection 08/29/11   Cipro per PCP- states is resolving    Past Surgical History:  Procedure Laterality Date   ABDOMINAL HYSTERECTOMY  1984   BACK SURGERY  1997-1998   fell on ice and snow   BREAST LUMPECTOMY WITH RADIOACTIVE SEED LOCALIZATION Left 08/06/2019   Procedure: LEFT BREAST LUMPECTOMY WITH RADIOACTIVE SEED LOCALIZATION;  Surgeon: Griselda Miner, MD;  Location: [facility];  Service: General;  Laterality: Left;   BUNIONECTOMY  2002   right foot   EYE SURGERY  2000   cataract extraction with IOL   HAND SURGERY  1994   cyst removed    HEMICOLECTOMY  09/06/11   INCISIONAL HERNIA REPAIR N/A 01/31/2017   Procedure: LAPAROSCOPIC INCISIONAL HERNIA REPAIR WITH MESH;  Surgeon: Avel Peace, MD;  Location: [facility];  Service: General;  Laterality: N/A;   INSERTION OF MESH N/A 01/31/2017   Procedure: INSERTION OF MESH;  Surgeon: Avel Peace, MD;  Location: [facility];  Service: General;  Laterality: N/A;   KNEE SURGERY  1990   POLYPECTOMY     SHOULDER SURGERY  2007   right    TONSILLECTOMY  age of 25   uretheral dilitation      Social History   Socioeconomic History   Marital status: Married    Spouse name: Not on file   Number of children: Not on file   Years of education: Not on file   Highest education level: Not on file  Occupational History   Not on file  Tobacco Use   Smoking status: Former    Current packs/day: 0.00    Average packs/day: 1.5 packs/day for 10.0 years (15.0 ttl pk-yrs)    Types: Cigarettes    Start date: 09/02/1978  Quit date: 09/01/1988    Years since quitting: 34.3   Smokeless tobacco: Never  Vaping Use   Vaping status: Never Used  Substance and Sexual Activity   Alcohol use: No   Drug use: No   Sexual activity: Not on file  Other Topics Concern   Not on file  Social History Narrative   Not on file   Social Determinants of Health   Financial Resource Strain: Not on file  Food Insecurity: Low Risk  (01/09/2023)   Received from [facility]   Hunger Vital Sign    Worried About Running Out of Food in the Last Year: Never true    Ran Out of Food in the Last Year: Never true  Transportation Needs: No Transportation Needs (01/09/2023)   Received from [facility]    In the past 12 months, has lack of reliable transportation kept you from medical appointments, meetings, work or from getting things needed for daily living? : No  Physical Activity: Not on file  Stress: Not on file  Social Connections: Not on file  Intimate Partner Violence: Not on file     Family  History  Problem Relation Age of Onset   Cancer Mother        breast   Cancer Cousin        breast   Cancer Maternal Uncle        colon    Current Outpatient Medications  Medication Sig Dispense Refill   acetaminophen (TYLENOL) 500 MG tablet Take 1 mg by mouth as needed.     albuterol (PROVENTIL) (2.5 MG/3ML) 0.083% nebulizer solution Take 2.5 mg by nebulization every 6 (six) hours as needed for wheezing or shortness of breath.      ALPRAZolam (XANAX) 0.5 MG tablet Take 0.25 mg by mouth 2 (two) times daily as needed for anxiety. Anxiety     aspirin EC 81 MG tablet Take 1 tablet (81 mg total) by mouth daily.     atorvastatin (LIPITOR) 10 MG tablet TAKE 1 TABLET AT BEDTIME (NEED MD APPOINTMENT) 90 tablet 3   B Complex Vitamins (VITAMIN B-COMPLEX) TABS Take 1 tablet by mouth daily.     budesonide-formoterol (SYMBICORT) 160-4.5 MCG/ACT inhaler Inhale 2 puffs into the lungs 2 (two) times daily.     Calcium Carbonate-Vitamin D (CALCIUM-CARB 600 + D PO) Take 2 tablets by mouth daily.     carboxymethylcellulose (REFRESH PLUS) 0.5 % SOLN Place 1 drop into both eyes 2 (two) times daily as needed. Dry eyes     cetirizine (ZYRTEC) 10 MG tablet Take 10 mg by mouth daily.     fluticasone (FLONASE) 50 MCG/ACT nasal spray Place 2 sprays into both nostrils 2 (two) times daily as needed for allergies.      furosemide (LASIX) 40 MG tablet TAKE 1 TABLET EVERY DAY (NEED MD APPOINTMENT) 90 tablet 3   Lutein 6 MG CAPS Take 6 mg by mouth daily.     montelukast (SINGULAIR) 10 MG tablet Take 10 mg by mouth at bedtime.      omeprazole (PRILOSEC) 20 MG capsule Take 20 mg by mouth daily.     potassium chloride SA (KLOR-CON M) 20 MEQ tablet TAKE 1 TABLET EVERY DAY (NEED MD APPOINTMENT) 90 tablet 3   vitamin E 400 UNIT capsule Take 400 Units by mouth daily.     No current facility-administered medications for this visit.    Allergies  Allergen Reactions   Elemental Sulfur Itching   Linaclotide  Other  reaction(s): Other (See Comments)  Diarrhea    Prednisone Other (See Comments)    Patient states that 20 mgs of prednisone feel loopy.   Rofecoxib Other (See Comments)    Unknown   Ampicillin Itching, Rash and Other (See Comments)    Unknown   Dicyclomine Rash   Doxycycline Rash    REVIEW OF SYSTEMS:   [X]  denotes positive finding, [ ]  denotes negative finding Cardiac  Comments:  Chest pain or chest pressure:    Shortness of breath upon exertion:    Short of breath when lying flat:    Irregular heart rhythm:        Vascular    Pain in calf, thigh, or hip brought on by ambulation:    Pain in feet at night that wakes you up from your sleep:     Blood clot in your veins:    Leg swelling:  x       Pulmonary    Oxygen at home:    Productive cough:     Wheezing:         Neurologic    Sudden weakness in arms or legs:     Sudden numbness in arms or legs:     Sudden onset of difficulty speaking or slurred speech:    Temporary loss of vision in one eye:     Problems with dizziness:         Gastrointestinal    Blood in stool:     Vomited blood:         Genitourinary    Burning when urinating:     Blood in urine:        Psychiatric    Major depression:         Hematologic    Bleeding problems:    Problems with blood clotting too easily:        Skin    Rashes or ulcers:        Constitutional    Fever or chills:      PHYSICAL EXAMINATION:  Today's Vitals   01/16/23 1342  BP: 121/68  Pulse: (!) 58  Temp: 97.7 F (36.5 C)  SpO2: 99%  Weight: 112 lb (50.8 kg)  PainSc: 8    Body mass index is 21.16 kg/m.   General:  WDWN in NAD; vital signs documented above Gait: Not observed HENT: WNL, normocephalic Pulmonary: normal non-labored breathing without wheezing Cardiac: regular HR; without carotid bruits Abdomen: soft, NT, aortic pulse is not palpable Skin: without rashes Vascular Exam/Pulses:  Right Left  Radial 2+ (normal) 2+ (normal)  DP 2+ (normal)  2+ (normal)   Extremities: mild BLE edema; varicose vein lateral right leg below the knee   Neurologic: A&O X 3;  moving all extremities equally Psychiatric:  The pt has Normal affect.   Non-Invasive Vascular Imaging:   Venous duplex on 01/16/2023: Venous Reflux Times  +--------------+---------+------+-----------+------------+--------+  RIGHT        Reflux NoRefluxReflux TimeDiameter cmsComments                          Yes                                   +--------------+---------+------+-----------+------------+--------+  CFV                    yes   >1 second                       +--------------+---------+------+-----------+------------+--------+  FV mid        no                                              +--------------+---------+------+-----------+------------+--------+  Popliteal    no                                              +--------------+---------+------+-----------+------------+--------+  GSV at SFJ              yes    >500 ms      .639              +--------------+---------+------+-----------+------------+--------+  GSV prox thighno                            .323              +--------------+---------+------+-----------+------------+--------+  GSV mid thigh no                            .323              +--------------+---------+------+-----------+------------+--------+  GSV dist thighno                            .311              +--------------+---------+------+-----------+------------+--------+  GSV at knee             yes    >500 ms      .256              +--------------+---------+------+-----------+------------+--------+  GSV prox calf           yes    >500 ms      .223              +--------------+---------+------+-----------+------------+--------+  SSV Pop Fossa no                            .152               +--------------+---------+------+-----------+------------+--------+  SSV prox calf no                            .131              +--------------+---------+------+-----------+------------+--------+   Summary:  Right:  - No evidence of deep vein thrombosis seen in the right lower extremity, from the common femoral through the popliteal veins.  - No evidence of superficial venous thrombosis in the right lower extremity.  - Venous reflux is noted in the right common femoral vein.  - Venous reflux is noted in the right sapheno-femoral junction.  - Venous reflux is noted in the right greater saphenous vein in the calf.     MELODY CAMERON is a 77 y.o. female who presents with: leg swelling and painful right leg varicosity.     -pt has palpable DP pedal pulses bilaterally -pt does not have evidence of DVT.  Pt does have have venous reflux in the right deep venous system as well  as the GSV at the [facility] and the GSV at the knee and proximal calf.  She is not a candidate for laser ablation.   -she does have a painful varicosity on the right lateral leg below the knee.  Discussed sclerotherapy with pt and Harriett Sine, vein RN also discussed with pt and she would like to proceed.   -discussed with pt about wearing knee high compression stockings.  She does have a compression.  She will bring them when she has her sclerotherapy and if they are not appropriate compression, she will be measured at that time and get a  pair here.  -discussed the importance of leg elevation and how to elevate properly - pt is advised to elevate their legs and a diagram is given to them to demonstrate for pt to lay flat on their back with knees elevated and slightly bent with their feet higher than their knees, which puts their feet higher than their heart for 15 minutes per day.  If pt cannot lay flat, advised to lay as flat as possible.  -pt is advised to continue as much walking as possible and avoid sitting or standing  for long periods of time.  -discussed importance of  exercise and that water aerobics would also be beneficial.  -handout with recommendations given   Doreatha Massed, [facility] [phone]  Clinic MD:  Edilia Bo

## 2023-01-30 ENCOUNTER — Ambulatory Visit: Payer: Medicare HMO | Admitting: Cardiology

## 2023-01-30 NOTE — Progress Notes (Signed)
Cardiology Office Note:    Date:  01/31/2023  ID:  Katherine Floyd, DOB 1946/01/13, MRN [id number] PCP: Richmond Campbell., PA-C  [facility] Providers Cardiologist:  Orbie Pyo, MD       Patient Profile:      (HFpEF) heart failure with preserved ejection fraction TTE 01/08/2018: EF 60-65, no RWMA, GR 1 DD, mild AI, trivial MR, trivial TR Paroxysmal atrial fibrillation  Prob 2/2 pericarditis - anticoagulation DCd in 2016 after no recurrence History of pericarditis Hypertension  Hyperlipidemia Aortic atherosclerosis        History of Present Illness:  Discussed the use of AI scribe software for clinical note transcription with the patient, who gave verbal consent to proceed.  Katherine Floyd is a 77 y.o. female who returns for follow-up of HFpEF, A-fib.  She was last seen by Dr. Katrinka Blazing 01/2022.    She is here alone. The patient reports leg swelling, which is managed with compression socks. The swelling decreases when the patient props up her legs and wears compression socks. The patient denies chest pain, shortness of breath, or passing out. The patient quit smoking around 1993 due to bronchitis. The patient's recent labs show normal potassium, creatinine, and ALT levels. LDL is optimal at 78, and HDL is optimal at 85. Hemoglobin is slightly low at 11.8.      Review of Systems  Gastrointestinal:  Negative for hematochezia and melena.  Genitourinary:  Negative for hematuria.  See HPI     Studies Reviewed:   EKG Interpretation Date/Time:  Friday January 31 2023 08:58:12 EDT Ventricular Rate:  61 PR Interval:  162 QRS Duration:  82 QT Interval:  410 QTC Calculation: 412 R Axis:   -27  Text Interpretation: Normal sinus rhythm Low voltage QRS No significant change since last tracing Confirmed by Tereso Newcomer [phone]) on 01/31/2023 9:01:25 AM    Risk Assessment/Calculations:             Physical Exam:   VS:  BP 114/62   Pulse 61   Ht 5\' 1"  (1.549 m)   Wt 113  lb 3.2 oz (51.3 kg)   SpO2 99%   BMI 21.39 kg/m    Wt Readings from Last 3 Encounters:  01/31/23 113 lb 3.2 oz (51.3 kg)  01/16/23 112 lb (50.8 kg)  01/28/22 126 lb (57.2 kg)    Constitutional:      Appearance: Healthy appearance. Not in distress.  Neck:     Vascular: No carotid bruit. JVD normal.  Pulmonary:     Breath sounds: Normal breath sounds. No wheezing. No rales.  Cardiovascular:     Normal rate. Regular rhythm.     Murmurs: There is no murmur.  Edema:    Peripheral edema absent.  Abdominal:     Palpations: Abdomen is soft.        Assessment and Plan:   Assessment & Plan Chronic heart failure with preserved ejection fraction (HCC) NYHA class 2, volume status stable. Last EF 60-65% in 2019. -Continue Furosemide 40mg  daily and Potassium daily. Paroxysmal atrial fibrillation (HCC) No recurrence of atrial fibrillation.  Atrial fibrillation occurred in the context of acute pericarditis.  Anticoagulation previously discontinued. -We would need to restart anticoagulation if she has a recurrence of atrial fibrillation. Primary hypertension Blood pressure controlled. -Continue Furosemide 40mg  daily. Aortic atherosclerosis (HCC) -Continue Aspirin 81mg  daily and Lipitor 10mg  daily. Hyperlipidemia, mixed Optimal lipid levels. LDL 78, HDL 85. -Continue Lipitor 10mg  daily.  Dispo:  Return in about 1 year (around 01/31/2024) for w/ Dr. Lynnette Caffey, or Tereso Newcomer, PA-C.  Signed, Tereso Newcomer, PA-C

## 2023-01-31 ENCOUNTER — Ambulatory Visit: Payer: Medicare HMO | Attending: Cardiology | Admitting: Physician Assistant

## 2023-01-31 ENCOUNTER — Encounter: Payer: Self-pay | Admitting: Physician Assistant

## 2023-01-31 VITALS — BP 114/62 | HR 61 | Ht 61.0 in | Wt 113.2 lb

## 2023-01-31 DIAGNOSIS — I10 Essential (primary) hypertension: Secondary | ICD-10-CM

## 2023-01-31 DIAGNOSIS — E782 Mixed hyperlipidemia: Secondary | ICD-10-CM

## 2023-01-31 DIAGNOSIS — I700 Atherosclerosis of aorta: Secondary | ICD-10-CM | POA: Diagnosis not present

## 2023-01-31 DIAGNOSIS — I5032 Chronic diastolic (congestive) heart failure: Secondary | ICD-10-CM

## 2023-01-31 DIAGNOSIS — I480 Paroxysmal atrial fibrillation: Secondary | ICD-10-CM | POA: Diagnosis not present

## 2023-01-31 NOTE — Assessment & Plan Note (Signed)
No recurrence of atrial fibrillation.  Atrial fibrillation occurred in the context of acute pericarditis.  Anticoagulation previously discontinued. -We would need to restart anticoagulation if she has a recurrence of atrial fibrillation.

## 2023-01-31 NOTE — Patient Instructions (Signed)
Medication Instructions:  Your physician recommends that you continue on your current medications as directed. Please refer to the Current Medication list given to you today.  *If you need a refill on your cardiac medications before your next appointment, please call your pharmacy*   Lab Work: None ordered  If you have labs (blood work) drawn today and your tests are completely normal, you will receive your results only by: MyChart Message (if you have MyChart) OR A paper copy in the mail If you have any lab test that is abnormal or we need to change your treatment, we will call you to review the results.   Testing/Procedures: None ordered   Follow-Up: At [facility], you and your health needs are our priority.  As part of our continuing mission to provide you with exceptional heart care, we have created designated Provider Care Teams.  These Care Teams include your primary Cardiologist (physician) and Advanced Practice Providers (APPs -  Physician Assistants and Nurse Practitioners) who all work together to provide you with the care you need, when you need it.  We recommend signing up for the patient portal called "MyChart".  Sign up information is provided on this After Visit Summary.  MyChart is used to connect with patients for Virtual Visits (Telemedicine).  Patients are able to view lab/test results, encounter notes, upcoming appointments, etc.  Non-urgent messages can be sent to your provider as well.   To learn more about what you can do with MyChart, go to [email or web address].    Your next appointment:   1 year(s)  Provider:   Orbie Pyo, MD  or Tereso Newcomer, PA-C         Other Instructions

## 2023-01-31 NOTE — Assessment & Plan Note (Signed)
Blood pressure controlled. -Continue Furosemide 40mg  daily.

## 2023-01-31 NOTE — Assessment & Plan Note (Signed)
NYHA class 2, volume status stable. Last EF 60-65% in 2019. -Continue Furosemide 40mg  daily and Potassium daily.

## 2023-02-12 ENCOUNTER — Other Ambulatory Visit: Payer: Self-pay

## 2023-02-12 MED ORDER — POTASSIUM CHLORIDE CRYS ER 20 MEQ PO TBCR: 20.0000 meq | EXTENDED_RELEASE_TABLET | Freq: Every day | ORAL | 3 refills | Status: DC

## 2023-02-13 ENCOUNTER — Other Ambulatory Visit: Payer: Self-pay

## 2023-02-13 MED ORDER — ATORVASTATIN CALCIUM 10 MG PO TABS: 10.0000 mg | ORAL_TABLET | Freq: Every day | ORAL | 3 refills | Status: DC

## 2023-02-13 MED ORDER — FUROSEMIDE 40 MG PO TABS: 40.0000 mg | ORAL_TABLET | Freq: Every day | ORAL | 3 refills | Status: DC

## 2023-02-14 ENCOUNTER — Ambulatory Visit (INDEPENDENT_AMBULATORY_CARE_PROVIDER_SITE_OTHER): Payer: Medicare HMO

## 2023-02-14 DIAGNOSIS — M7989 Other specified soft tissue disorders: Secondary | ICD-10-CM

## 2023-02-14 NOTE — Progress Notes (Signed)
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 She feels it mainly when she walks and wears compression socks often, which help. She was here to have sclerotherapy on this area. I was unable to get a blood return when I attempted several times to inject the vein. The vein also feels slightly firm and met resistance when attempting to inject. MD came in to assess and using sonosite, confirmed the vein is not thrombosed. I tried again to inject and was unable to. Pt was placed in 20-30 mm Hg compression hose, that she felt better in than her compression socks. She is going to give the area a few months and will call us back to see vein MD if the area is still causing discomfort. Pt and her sister were in agreement with this plan. No further questions/concerns at this time.

## 2023-03-06 ENCOUNTER — Other Ambulatory Visit: Payer: Self-pay

## 2023-03-06 ENCOUNTER — Observation Stay (HOSPITAL_COMMUNITY)
Admission: EM | Admit: 2023-03-06 | Discharge: 2023-03-11 | Disposition: A | Discharge status: Home / Self Care | Payer: Medicare HMO | Attending: Family Medicine | Admitting: Family Medicine

## 2023-03-06 ENCOUNTER — Encounter (HOSPITAL_COMMUNITY): Payer: Self-pay

## 2023-03-06 ENCOUNTER — Emergency Department (HOSPITAL_COMMUNITY): Payer: Medicare HMO

## 2023-03-06 DIAGNOSIS — K560 Paralytic ileus: Principal | ICD-10-CM

## 2023-03-06 DIAGNOSIS — Z7982 Long term (current) use of aspirin: Secondary | ICD-10-CM | POA: Diagnosis not present

## 2023-03-06 DIAGNOSIS — K839 Disease of biliary tract, unspecified: Secondary | ICD-10-CM | POA: Diagnosis not present

## 2023-03-06 DIAGNOSIS — I480 Paroxysmal atrial fibrillation: Secondary | ICD-10-CM

## 2023-03-06 DIAGNOSIS — K56609 Unspecified intestinal obstruction, unspecified as to partial versus complete obstruction: Secondary | ICD-10-CM | POA: Diagnosis not present

## 2023-03-06 DIAGNOSIS — I5032 Chronic diastolic (congestive) heart failure: Secondary | ICD-10-CM

## 2023-03-06 DIAGNOSIS — K56699 Other intestinal obstruction unspecified as to partial versus complete obstruction: Secondary | ICD-10-CM | POA: Diagnosis not present

## 2023-03-06 DIAGNOSIS — R932 Abnormal findings on diagnostic imaging of liver and biliary tract: Secondary | ICD-10-CM | POA: Diagnosis not present

## 2023-03-06 DIAGNOSIS — K8020 Calculus of gallbladder without cholecystitis without obstruction: Secondary | ICD-10-CM | POA: Diagnosis not present

## 2023-03-06 DIAGNOSIS — J45909 Unspecified asthma, uncomplicated: Secondary | ICD-10-CM | POA: Insufficient documentation

## 2023-03-06 DIAGNOSIS — R109 Unspecified abdominal pain: Secondary | ICD-10-CM | POA: Diagnosis present

## 2023-03-06 DIAGNOSIS — Z87891 Personal history of nicotine dependence: Secondary | ICD-10-CM | POA: Insufficient documentation

## 2023-03-06 DIAGNOSIS — K838 Other specified diseases of biliary tract: Secondary | ICD-10-CM | POA: Diagnosis not present

## 2023-03-06 DIAGNOSIS — Z79899 Other long term (current) drug therapy: Secondary | ICD-10-CM | POA: Insufficient documentation

## 2023-03-06 LAB — COMPREHENSIVE METABOLIC PANEL
ALT: 16 U/L (ref 0–44)
AST: 20 U/L (ref 15–41)
Albumin: 3.8 g/dL (ref 3.5–5.0)
Alkaline Phosphatase: 53 U/L (ref 38–126)
Anion gap: 11 (ref 5–15)
BUN: 18 mg/dL (ref 8–23)
CO2: 24 mmol/L (ref 22–32)
Calcium: 10 mg/dL (ref 8.9–10.3)
Chloride: 102 mmol/L (ref 98–111)
Creatinine, Ser: 1.26 mg/dL — ABNORMAL HIGH (ref 0.44–1.00)
GFR, Estimated: 44 mL/min — ABNORMAL LOW (ref >60)
Glucose, Bld: 106 mg/dL — ABNORMAL HIGH (ref 70–99)
Potassium: 3.9 mmol/L (ref 3.5–5.1)
Sodium: 137 mmol/L (ref 135–145)
Total Bilirubin: 1.1 mg/dL (ref <1.2)
Total Protein: 6.4 g/dL — ABNORMAL LOW (ref 6.5–8.1)

## 2023-03-06 LAB — CBC WITH DIFFERENTIAL/PLATELET
Abs Immature Granulocytes: 0.03 10*3/uL (ref 0.00–0.07)
Basophils Absolute: 0.1 10*3/uL (ref 0.0–0.1)
Basophils Relative: 1 %
Eosinophils Absolute: 0 10*3/uL (ref 0.0–0.5)
Eosinophils Relative: 0 %
HCT: 37.1 % (ref 36.0–46.0)
Hemoglobin: 11.9 g/dL — ABNORMAL LOW (ref 12.0–15.0)
Immature Granulocytes: 0 %
Lymphocytes Relative: 28 %
Lymphs Abs: 2.7 10*3/uL (ref 0.7–4.0)
MCH: 30.1 pg (ref 26.0–34.0)
MCHC: 32.1 g/dL (ref 30.0–36.0)
MCV: 93.9 fL (ref 80.0–100.0)
Monocytes Absolute: 0.8 10*3/uL (ref 0.1–1.0)
Monocytes Relative: 8 %
Neutro Abs: 6 10*3/uL (ref 1.7–7.7)
Neutrophils Relative %: 63 %
Platelets: 227 10*3/uL (ref 150–400)
RBC: 3.95 MIL/uL (ref 3.87–5.11)
RDW: 13.4 % (ref 11.5–15.5)
WBC: 9.6 10*3/uL (ref 4.0–10.5)
nRBC: 0 % (ref 0.0–0.2)

## 2023-03-06 LAB — URINALYSIS, ROUTINE W REFLEX MICROSCOPIC
Bilirubin Urine: NEGATIVE
Glucose, UA: NEGATIVE mg/dL
Hgb urine dipstick: NEGATIVE
Ketones, ur: NEGATIVE mg/dL
Leukocytes,Ua: NEGATIVE
Nitrite: NEGATIVE
Protein, ur: NEGATIVE mg/dL
Specific Gravity, Urine: 1.012 (ref 1.005–1.030)
pH: 5 (ref 5.0–8.0)

## 2023-03-06 LAB — LIPASE, BLOOD: Lipase: 34 U/L (ref 11–51)

## 2023-03-06 MED ORDER — ALBUTEROL SULFATE (2.5 MG/3ML) 0.083% IN NEBU: 2.5000 mg | INHALATION_SOLUTION | Freq: Four times a day (QID) | RESPIRATORY_TRACT | Status: DC | PRN

## 2023-03-06 MED ORDER — ACETAMINOPHEN 650 MG RE SUPP: 650.0000 mg | Freq: Four times a day (QID) | RECTAL | Status: DC | PRN

## 2023-03-06 MED ORDER — LACTATED RINGERS IV BOLUS
1000.0000 mL | Freq: Once | INTRAVENOUS | Status: AC
  Administered 2023-03-06: 1000 mL via INTRAVENOUS

## 2023-03-06 MED ORDER — ONDANSETRON HCL 4 MG/2ML IJ SOLN
4.0000 mg | Freq: Four times a day (QID) | INTRAMUSCULAR | Status: DC | PRN
  Administered 2023-03-10: 4 mg via INTRAVENOUS

## 2023-03-06 MED ORDER — ATORVASTATIN CALCIUM 10 MG PO TABS
10.0000 mg | ORAL_TABLET | Freq: Every day | ORAL | Status: DC
  Administered 2023-03-06 – 2023-03-10 (×5): 10 mg via ORAL
  Filled 2023-03-06 (×5): qty 1

## 2023-03-06 MED ORDER — FENTANYL CITRATE PF 50 MCG/ML IJ SOSY
25.0000 ug | PREFILLED_SYRINGE | Freq: Once | INTRAMUSCULAR | Status: AC
  Administered 2023-03-06: 25 ug via INTRAVENOUS
  Filled 2023-03-06: qty 1

## 2023-03-06 MED ORDER — ONDANSETRON HCL 4 MG/2ML IJ SOLN
4.0000 mg | Freq: Once | INTRAMUSCULAR | Status: AC
  Administered 2023-03-06: 4 mg via INTRAVENOUS
  Filled 2023-03-06: qty 2

## 2023-03-06 MED ORDER — MORPHINE SULFATE (PF) 2 MG/ML IV SOLN
2.0000 mg | INTRAVENOUS | Status: DC | PRN
Start: 2023-03-06 — End: 2023-03-07
  Administered 2023-03-06 – 2023-03-07 (×2): 4 mg via INTRAVENOUS
  Filled 2023-03-06 (×2): qty 2

## 2023-03-06 MED ORDER — ASPIRIN 81 MG PO TBEC
81.0000 mg | DELAYED_RELEASE_TABLET | Freq: Every day | ORAL | Status: DC
  Administered 2023-03-07 – 2023-03-11 (×4): 81 mg via ORAL
  Filled 2023-03-06 (×4): qty 1

## 2023-03-06 MED ORDER — ENOXAPARIN SODIUM 40 MG/0.4ML IJ SOSY
40.0000 mg | PREFILLED_SYRINGE | INTRAMUSCULAR | Status: DC
  Administered 2023-03-07: 40 mg via SUBCUTANEOUS
  Filled 2023-03-06: qty 0.4

## 2023-03-06 MED ORDER — ACETAMINOPHEN 325 MG PO TABS
650.0000 mg | ORAL_TABLET | Freq: Four times a day (QID) | ORAL | Status: DC | PRN
  Administered 2023-03-09 (×2): 650 mg via ORAL
  Filled 2023-03-06 (×2): qty 2

## 2023-03-06 MED ORDER — LACTATED RINGERS IV SOLN: INTRAVENOUS | Status: AC

## 2023-03-06 MED ORDER — ALPRAZOLAM 0.25 MG PO TABS
0.2500 mg | ORAL_TABLET | Freq: Two times a day (BID) | ORAL | Status: DC | PRN
  Administered 2023-03-08: 0.25 mg via ORAL
  Filled 2023-03-06 (×2): qty 1

## 2023-03-06 MED ORDER — ONDANSETRON HCL 4 MG PO TABS: 4.0000 mg | ORAL_TABLET | Freq: Four times a day (QID) | ORAL | Status: DC | PRN

## 2023-03-06 MED ORDER — NYSTATIN 100000 UNIT/GM EX CREA
TOPICAL_CREAM | Freq: Two times a day (BID) | CUTANEOUS | Status: DC
  Filled 2023-03-06: qty 30

## 2023-03-06 MED ORDER — IOHEXOL 350 MG/ML SOLN
75.0000 mL | Freq: Once | INTRAVENOUS | Status: AC | PRN
Start: 2023-03-06 — End: 2023-03-06
  Administered 2023-03-06: 75 mL via INTRAVENOUS

## 2023-03-06 NOTE — H&P (Signed)
History and Physical    Patient: Katherine Floyd GEX:[id number] DOB: [birth year] DOA: 03/06/2023 DOS: the patient was seen and examined on 03/06/2023 PCP: Richmond Campbell., PA-C  Patient coming from: Home  Chief Complaint:  Chief Complaint  Patient presents with   Abdominal Pain   HPI: Katherine Floyd is a 77 y.o. female with medical history significant of PAF not on AC, bowel surgery in past.  Pt in to ED with N/V/D, abd pain.  Ongoing for past 3 days.  X ray done at [facility] today.  Pt sent in to ED for concern for SBO.  CT scan in ED = LBO vs ileus.  Also bile duct dilation of unclear significance with nl LFTs.  Chronic constipation.    Review of Systems: As mentioned in the history of present illness. All other systems reviewed and are negative. Past Medical History:  Diagnosis Date   Anxiety    Arthritis    with fracture right great toe 08/30/11 from "stepping wrong"   Asthma    Dysrhythmia 06/21/2013   NEW ONSET ATRIAL FIBRILATION/RVR   GERD (gastroesophageal reflux disease)    Hypotension    Kidney stone    Osteoporosis    Polyp of colon, adenomatous    recurrent   Urinary tract infection 08/29/11   Cipro per PCP- states is resolving   Past Surgical History:  Procedure Laterality Date   ABDOMINAL HYSTERECTOMY  1984   BACK SURGERY  1997-1998   fell on ice and snow   BREAST LUMPECTOMY WITH RADIOACTIVE SEED LOCALIZATION Left 08/06/2019   Procedure: LEFT BREAST LUMPECTOMY WITH RADIOACTIVE SEED LOCALIZATION;  Surgeon: Griselda Miner, MD;  Location: [facility];  Service: General;  Laterality: Left;   BUNIONECTOMY  2002   right foot   EYE SURGERY  2000   cataract extraction with IOL   HAND SURGERY  1994   cyst removed   HEMICOLECTOMY  09/06/11   INCISIONAL HERNIA REPAIR N/A 01/31/2017   Procedure: LAPAROSCOPIC INCISIONAL HERNIA REPAIR WITH MESH;  Surgeon: Avel Peace, MD;  Location: [facility];  Service: General;  Laterality: N/A;   INSERTION OF MESH N/A  01/31/2017   Procedure: INSERTION OF MESH;  Surgeon: Avel Peace, MD;  Location: [facility];  Service: General;  Laterality: N/A;   KNEE SURGERY  1990   POLYPECTOMY     SHOULDER SURGERY  2007   right    TONSILLECTOMY  age of 49   uretheral dilitation     Social History:  reports that she quit smoking about 34 years ago. Her smoking use included cigarettes. She started smoking about 44 years ago. She has a 15 pack-year smoking history. She has never used smokeless tobacco. She reports that she does not drink alcohol and does not use drugs.  Allergies  Allergen Reactions   Elemental Sulfur Itching   Linaclotide     Other reaction(s): Other (See Comments)  Diarrhea    Prednisone Other (See Comments)    Patient states that 20 mgs of prednisone feel loopy.   Rofecoxib Other (See Comments)    Unknown   Ampicillin Itching, Rash and Other (See Comments)    Unknown   Dicyclomine Rash   Doxycycline Rash    Family History  Problem Relation Age of Onset   Cancer Mother        breast   Cancer Cousin        breast   Cancer Maternal Uncle  colon    Prior to Admission medications   Medication Sig Start Date End Date Taking? Authorizing Provider  acetaminophen (TYLENOL) 500 MG tablet Take 1 mg by mouth as needed.   Yes [provider]  albuterol (PROVENTIL) (2.5 MG/3ML) 0.083% nebulizer solution Take 2.5 mg by nebulization every 6 (six) hours as needed for wheezing or shortness of breath.    Yes [provider]  ALPRAZolam Prudy Feeler) 0.5 MG tablet Take 0.25 mg by mouth 2 (two) times daily as needed for anxiety. Anxiety 06/17/11  Yes [provider]  aspirin EC 81 MG tablet Take 1 tablet (81 mg total) by mouth daily. 04/18/15  Yes Lyn Records, MD  atorvastatin (LIPITOR) 10 MG tablet Take 1 tablet (10 mg total) by mouth at bedtime. 02/13/23  Yes Weaver, Scott T, PA-C  B Complex Vitamins (VITAMIN B-COMPLEX) TABS Take 1 tablet by mouth daily.   Yes [provider]  budesonide-formoterol (SYMBICORT) 160-4.5 MCG/ACT inhaler Inhale 2 puffs into the lungs 2 (two) times daily. 07/09/16  Yes [provider]  Calcium Carbonate-Vitamin D (CALCIUM-CARB 600 + D PO) Take 2 tablets by mouth daily.   Yes [provider]  carboxymethylcellulose (REFRESH PLUS) 0.5 % SOLN Place 1 drop into both eyes 2 (two) times daily as needed. Dry eyes   Yes [provider]  cetirizine (ZYRTEC) 10 MG tablet Take 10 mg by mouth daily.   Yes [provider]  fluticasone (FLONASE) 50 MCG/ACT nasal spray Place 2 sprays into both nostrils 2 (two) times daily as needed for allergies.  05/22/16  Yes [provider]  furosemide (LASIX) 40 MG tablet Take 1 tablet (40 mg total) by mouth daily. 02/13/23  Yes Weaver, Scott T, PA-C  LINZESS 145 MCG CAPS capsule Take 145 mcg by mouth every morning.   Yes [provider]  Lutein 6 MG CAPS Take 6 mg by mouth daily.   Yes [provider]  montelukast (SINGULAIR) 10 MG tablet Take 10 mg by mouth at bedtime.  07/17/11  Yes [provider]  omeprazole (PRILOSEC) 20 MG capsule Take 20 mg by mouth daily.   Yes [provider]  potassium chloride SA (KLOR-CON M) 20 MEQ tablet Take 1 tablet (20 mEq total) by mouth daily. 02/12/23  Yes Weaver, Scott T, PA-C  vitamin E 400 UNIT capsule Take 400 Units by mouth daily.   Yes [provider]    Physical Exam: Vitals:   03/06/23 1525 03/06/23 1533 03/06/23 1745 03/06/23 2030  BP:  128/67 132/83 139/71  Pulse:  83 71 76  Resp:  16 17 18   Temp:  98.2 F (36.8 C) 98 F (36.7 C) 98.1 F (36.7 C)  TempSrc:  Oral  Oral  SpO2:  98% 100% 100%  Weight: 49.4 kg     Height: 4\' 11"  (1.499 m)      Constitutional: NAD, calm, comfortable Respiratory: clear to auscultation bilaterally, no wheezing, no crackles. Normal respiratory effort. No accessory muscle use.  Cardiovascular: Regular rate and rhythm, no murmurs /  rubs / gallops. No extremity edema. 2+ pedal pulses. No carotid bruits.  Abdomen: Distended Neurologic: CN 2-12 grossly intact. Sensation intact, DTR normal. Strength 5/5 in all 4.  Psychiatric: Normal judgment and insight. Alert and oriented x 3. Normal mood.   Data Reviewed:    Labs on Admission: I have personally reviewed following labs and imaging studies  CBC: Recent Labs  Lab 03/06/23 1533  WBC 9.6  NEUTROABS 6.0  HGB 11.9*  HCT 37.1  MCV 93.9  PLT 227   Basic Metabolic Panel: Recent Labs  Lab 03/06/23 1533  NA 137  K 3.9  CL 102  CO2 24  GLUCOSE 106*  BUN 18  CREATININE 1.26*  CALCIUM 10.0   GFR: Estimated Creatinine Clearance: 25.1 mL/min (A) (by C-G formula based on SCr of 1.26 mg/dL (H)). Liver Function Tests: Recent Labs  Lab 03/06/23 1533  AST 20  ALT 16  ALKPHOS 53  BILITOT 1.1  PROT 6.4*  ALBUMIN 3.8   Recent Labs  Lab 03/06/23 1533  LIPASE 34   No results for input(s): "AMMONIA" in the last 168 hours. Coagulation Profile: No results for input(s): "INR", "PROTIME" in the last 168 hours. Cardiac Enzymes: No results for input(s): "CKTOTAL", "CKMB", "CKMBINDEX", "TROPONINI" in the last 168 hours. BNP (last 3 results) No results for input(s): "PROBNP" in the last 8760 hours. HbA1C: No results for input(s): "HGBA1C" in the last 72 hours. CBG: No results for input(s): "GLUCAP" in the last 168 hours. Lipid Profile: No results for input(s): "CHOL", "HDL", "LDLCALC", "TRIG", "CHOLHDL", "LDLDIRECT" in the last 72 hours. Thyroid Function Tests: No results for input(s): "TSH", "T4TOTAL", "FREET4", "T3FREE", "THYROIDAB" in the last 72 hours. Anemia Panel: No results for input(s): "VITAMINB12", "FOLATE", "FERRITIN", "TIBC", "IRON", "RETICCTPCT" in the last 72 hours. Urine analysis:    Component Value Date/Time   COLORURINE YELLOW 03/06/2023 1605   APPEARANCEUR CLEAR 03/06/2023 1605   LABSPEC 1.012 03/06/2023 1605   PHURINE 5.0 03/06/2023 1605    GLUCOSEU NEGATIVE 03/06/2023 1605   HGBUR NEGATIVE 03/06/2023 1605   BILIRUBINUR NEGATIVE 03/06/2023 1605   KETONESUR NEGATIVE 03/06/2023 1605   PROTEINUR NEGATIVE 03/06/2023 1605   NITRITE NEGATIVE 03/06/2023 1605   LEUKOCYTESUR NEGATIVE 03/06/2023 1605    Radiological Exams on Admission: CT ABDOMEN PELVIS W CONTRAST  Result Date: 03/06/2023 CLINICAL DATA:  Bowel obstruction suspected EXAM: CT ABDOMEN AND PELVIS WITH CONTRAST TECHNIQUE: Multidetector CT imaging of the abdomen and pelvis was performed using the standard protocol following bolus administration of intravenous contrast. RADIATION DOSE REDUCTION: This exam was performed according to the departmental dose-optimization program which includes automated exposure control, adjustment of the mA and/or kV according to patient size and/or use of iterative reconstruction technique. CONTRAST:  75mL OMNIPAQUE IOHEXOL 350 MG/ML SOLN COMPARISON:  01/08/2021, 01/08/2023 FINDINGS: Lower chest: No acute pleural or parenchymal lung disease. Hepatobiliary: Calcified gallstones without evidence of acute cholecystitis. Liver parenchyma is unremarkable without focal abnormality. There is intrahepatic and extrahepatic biliary duct dilation, new since prior study, with common bile duct measuring up to 10 mm. No evidence of choledocholithiasis. Pancreas: Unremarkable. No pancreatic ductal dilatation or surrounding inflammatory changes. Spleen: Normal in size without focal abnormality. Adrenals/Urinary Tract: Adrenal glands are unremarkable. Kidneys are normal, without renal calculi, focal lesion, or hydronephrosis. Bladder is unremarkable. Stomach/Bowel: Postsurgical changes from right hemicolectomy are again noted. There is marked gaseous distention of the colon, with a large amount of stool within the rectum. Oval radiopaque foreign bodies are seen within the bowel lumen within the mid jejunum, sigmoid colon, and rectum, likely representing undigested pills.  There is prominent distension of the mid jejunum through ileum, with multiple gas fluid levels identified. The stomach, duodenum, and proximal jejunum are fairly decompressed. Maximal diameter of the jejunum measures up to 3.5 cm, with numerous gas fluid levels. No bowel wall thickening or inflammatory change. Vascular/Lymphatic: Aortic atherosclerosis. No enlarged abdominal or pelvic lymph nodes. Reproductive: Status post hysterectomy. No adnexal masses.  Other: Trace free fluid within the bilateral paracolic gutters. No free intraperitoneal gas. Postsurgical changes from prior ventral hernia repair. Musculoskeletal: No acute or destructive bony abnormalities. Reconstructed images demonstrate no additional findings. IMPRESSION: 1. Prominent bowel distension extending from the mid jejunum through the rectum, with numerous small bowel gas fluid levels identified. Without clear transition point, and given the diffuse distension of the entirety of the distal bowel, ileus is favored over distal colonic obstruction. 2. Interval development of intrahepatic and extrahepatic biliary duct dilation, without clear cause for obstruction or evidence of choledocholithiasis. Please correlate with liver function tests. 3. Cholelithiasis without evidence of acute cholecystitis. 4. Trace free fluid within the bilateral paracolic gutters. 5.  Aortic Atherosclerosis (ICD10-I70.0). Electronically Signed   By: Sharlet Salina M.D.   On: 03/06/2023 19:59    EKG: Independently reviewed.   Assessment and Plan: * Bowel obstruction (HCC) LBO vs ileus. NPO IVF PRN zofran PRN morphine Defer to gen surg if she needs NGT or not Gen surg doesn't think pt has sigmoid volvulus.  Bile duct abnormality LFTs nl repeat in AM to trend given the bile duct dilation of unclear significance. Need for MRCP? Ill defer that question to GI since it looks like they got called by EDP anyhow. Touch base with them regarding this question if we  havent heard back one way or the other before discharge.  Paroxysmal atrial fibrillation (HCC) Looks like she just takes ASA 81 and no other AC nor rate control meds for this.  Chronic heart failure with preserved ejection fraction (HFpEF) (HCC) Hold lasix given NPO status      Advance Care Planning:   Code Status: Full Code  Consults: None  Family Communication: No family in room  Severity of Illness: The appropriate patient status for this patient is OBSERVATION. Observation status is judged to be reasonable and necessary in order to provide the required intensity of service to ensure the patient's safety. The patient's presenting symptoms, physical exam findings, and initial radiographic and laboratory data in the context of their medical condition is felt to place them at decreased risk for further clinical deterioration. Furthermore, it is anticipated that the patient will be medically stable for discharge from the hospital within 2 midnights of admission.   Author: Hillary Bow., DO 03/06/2023 8:57 PM  For on call review [email or web address].

## 2023-03-06 NOTE — ED Triage Notes (Signed)
PT UNABLE TO PAS GAS

## 2023-03-06 NOTE — Assessment & Plan Note (Signed)
Hold lasix given NPO status

## 2023-03-06 NOTE — ED Notes (Signed)
ED TO INPATIENT HANDOFF REPORT  ED Nurse Name and Phone #:  Minerva Areola [phone]  S Name/Age/Gender Katherine Floyd 76 y.o. female Room/Bed: 028C/028C  Code Status   Code Status: Full Code  Home/SNF/Other Home Patient oriented to: self, place, time, and situation Is this baseline? Yes   Triage Complete: Triage complete  Chief Complaint SBO (small bowel obstruction) (HCC) [K56.609]  Triage Note Pt had xray done at [facility] today and was told she had a bowel obstruction and yeast infection. Pt has abd pain . Bloating, NVD. These symptoms have been going on for 3 days. Pt has burning with urination but was told she had no UTI.  PT UNABLE TO PAS GAS   Allergies Allergies  Allergen Reactions   Elemental Sulfur Itching   Linaclotide     Other reaction(s): Other (See Comments)  Diarrhea    Prednisone Other (See Comments)    Patient states that 20 mgs of prednisone feel loopy.   Rofecoxib Other (See Comments)    Unknown   Ampicillin Itching, Rash and Other (See Comments)    Unknown   Dicyclomine Rash   Doxycycline Rash    Level of Care/Admitting Diagnosis ED Disposition     ED Disposition  Admit   Condition  --   Comment  Hospital Area: [facility] [[id number]]  Level of Care: Med-Surg [16]  May place patient in observation at [facility] or [facility] if equivalent level of care is available:: No  Covid Evaluation: Asymptomatic - no recent exposure (last 10 days) testing not required  Diagnosis: SBO (small bowel obstruction) [facility]) [[id number]]  Admitting Physician: Hillary Bow [phone]  Attending Physician: Hillary Bow [[id number]]          B Medical/Surgery History Past Medical History:  Diagnosis Date   Anxiety    Arthritis    with fracture right great toe 08/30/11 from "stepping wrong"   Asthma    Dysrhythmia 06/21/2013   NEW ONSET ATRIAL FIBRILATION/RVR   GERD (gastroesophageal reflux disease)    Hypotension    Kidney stone    Osteoporosis     Polyp of colon, adenomatous    recurrent   Urinary tract infection 08/29/11   Cipro per PCP- states is resolving   Past Surgical History:  Procedure Laterality Date   ABDOMINAL HYSTERECTOMY  1984   BACK SURGERY  1997-1998   fell on ice and snow   BREAST LUMPECTOMY WITH RADIOACTIVE SEED LOCALIZATION Left 08/06/2019   Procedure: LEFT BREAST LUMPECTOMY WITH RADIOACTIVE SEED LOCALIZATION;  Surgeon: Griselda Miner, MD;  Location: [facility];  Service: General;  Laterality: Left;   BUNIONECTOMY  2002   right foot   EYE SURGERY  2000   cataract extraction with IOL   HAND SURGERY  1994   cyst removed   HEMICOLECTOMY  09/06/11   INCISIONAL HERNIA REPAIR N/A 01/31/2017   Procedure: LAPAROSCOPIC INCISIONAL HERNIA REPAIR WITH MESH;  Surgeon: Avel Peace, MD;  Location: [facility];  Service: General;  Laterality: N/A;   INSERTION OF MESH N/A 01/31/2017   Procedure: INSERTION OF MESH;  Surgeon: Avel Peace, MD;  Location: [facility];  Service: General;  Laterality: N/A;   KNEE SURGERY  1990   POLYPECTOMY     SHOULDER SURGERY  2007   right    TONSILLECTOMY  age of 64   uretheral dilitation       A IV Location/Drains/Wounds Patient Lines/Drains/Airways Status     Active Line/Drains/Airways  Name Placement date Placement time Site Days   Peripheral IV 03/06/23 20 G 1" Anterior;Proximal;Right Forearm 03/06/23  1740  Forearm  less than 1   Incision - 4 Ports Abdomen 1: Umbilicus 2: Left;Lateral;Lower 3: Left;Lateral;Mid 4: Right;Lower;Lateral 09/06/11  0826  -- 4199   Incision - 4 Ports Abdomen Right;Upper Left;Upper Right;Lower Left;Lower 01/31/17  0800  -- 2225            Intake/Output Last 24 hours No intake or output data in the 24 hours ending 03/06/23 2107  Labs/Imaging Results for orders placed or performed during the hospital encounter of 03/06/23 (from the past 48 hour(s))  Comprehensive metabolic panel     Status: Abnormal   Collection Time: 03/06/23  3:33 PM   Result Value Ref Range   Sodium 137 135 - 145 mmol/L   Potassium 3.9 3.5 - 5.1 mmol/L   Chloride 102 98 - 111 mmol/L   CO2 24 22 - 32 mmol/L   Glucose, Bld 106 (H) 70 - 99 mg/dL    Comment: Glucose reference range applies only to samples taken after fasting for at least 8 hours.   BUN 18 8 - 23 mg/dL   Creatinine, Ser 9.52 (H) 0.44 - 1.00 mg/dL   Calcium 84.1 8.9 - 32.4 mg/dL   Total Protein 6.4 (L) 6.5 - 8.1 g/dL   Albumin 3.8 3.5 - 5.0 g/dL   AST 20 15 - 41 U/L   ALT 16 0 - 44 U/L   Alkaline Phosphatase 53 38 - 126 U/L   Total Bilirubin 1.1 <1.2 mg/dL   GFR, Estimated 44 (L) >60 mL/min    Comment: (NOTE) Calculated using the CKD-EPI Creatinine Equation (2021)    Anion gap 11 5 - 15    Comment: Performed at [facility], [address], [place], [place] [address]  Lipase, blood     Status: None   Collection Time: 03/06/23  3:33 PM  Result Value Ref Range   Lipase 34 11 - 51 U/L    Comment: Performed at [facility], [address], [place], [place] [address]  CBC with Diff     Status: Abnormal   Collection Time: 03/06/23  3:33 PM  Result Value Ref Range   WBC 9.6 4.0 - 10.5 K/uL   RBC 3.95 3.87 - 5.11 MIL/uL   Hemoglobin 11.9 (L) 12.0 - 15.0 g/dL   HCT 64.4 03.4 - 74.2 %   MCV 93.9 80.0 - 100.0 fL   MCH 30.1 26.0 - 34.0 pg   MCHC 32.1 30.0 - 36.0 g/dL   RDW 59.5 63.8 - 75.6 %   Platelets 227 150 - 400 K/uL   nRBC 0.0 0.0 - 0.2 %   Neutrophils Relative % 63 %   Neutro Abs 6.0 1.7 - 7.7 K/uL   Lymphocytes Relative 28 %   Lymphs Abs 2.7 0.7 - 4.0 K/uL   Monocytes Relative 8 %   Monocytes Absolute 0.8 0.1 - 1.0 K/uL   Eosinophils Relative 0 %   Eosinophils Absolute 0.0 0.0 - 0.5 K/uL   Basophils Relative 1 %   Basophils Absolute 0.1 0.0 - 0.1 K/uL   Immature Granulocytes 0 %   Abs Immature Granulocytes 0.03 0.00 - 0.07 K/uL    Comment: Performed at [facility], [address] [place], [place] [address]  Urinalysis, Routine w reflex microscopic  -Urine, Clean Catch     Status: None   Collection Time: 03/06/23  4:05  PM  Result Value Ref Range   Color, Urine YELLOW YELLOW   APPearance CLEAR CLEAR   Specific Gravity, Urine 1.012 1.005 - 1.030   pH 5.0 5.0 - 8.0   Glucose, UA NEGATIVE NEGATIVE mg/dL   Hgb urine dipstick NEGATIVE NEGATIVE   Bilirubin Urine NEGATIVE NEGATIVE   Ketones, ur NEGATIVE NEGATIVE mg/dL   Protein, ur NEGATIVE NEGATIVE mg/dL   Nitrite NEGATIVE NEGATIVE   Leukocytes,Ua NEGATIVE NEGATIVE    Comment: Performed at [facility], [address], [place], [place] [address]   CT ABDOMEN PELVIS W CONTRAST  Result Date: 03/06/2023 CLINICAL DATA:  Bowel obstruction suspected EXAM: CT ABDOMEN AND PELVIS WITH CONTRAST TECHNIQUE: Multidetector CT imaging of the abdomen and pelvis was performed using the standard protocol following bolus administration of intravenous contrast. RADIATION DOSE REDUCTION: This exam was performed according to the departmental dose-optimization program which includes automated exposure control, adjustment of the mA and/or kV according to patient size and/or use of iterative reconstruction technique. CONTRAST:  75mL OMNIPAQUE IOHEXOL 350 MG/ML SOLN COMPARISON:  01/08/2021, 01/08/2023 FINDINGS: Lower chest: No acute pleural or parenchymal lung disease. Hepatobiliary: Calcified gallstones without evidence of acute cholecystitis. Liver parenchyma is unremarkable without focal abnormality. There is intrahepatic and extrahepatic biliary duct dilation, new since prior study, with common bile duct measuring up to 10 mm. No evidence of choledocholithiasis. Pancreas: Unremarkable. No pancreatic ductal dilatation or surrounding inflammatory changes. Spleen: Normal in size without focal abnormality. Adrenals/Urinary Tract: Adrenal glands are unremarkable. Kidneys are normal, without renal calculi, focal lesion, or hydronephrosis. Bladder is unremarkable. Stomach/Bowel: Postsurgical changes from right  hemicolectomy are again noted. There is marked gaseous distention of the colon, with a large amount of stool within the rectum. Oval radiopaque foreign bodies are seen within the bowel lumen within the mid jejunum, sigmoid colon, and rectum, likely representing undigested pills. There is prominent distension of the mid jejunum through ileum, with multiple gas fluid levels identified. The stomach, duodenum, and proximal jejunum are fairly decompressed. Maximal diameter of the jejunum measures up to 3.5 cm, with numerous gas fluid levels. No bowel wall thickening or inflammatory change. Vascular/Lymphatic: Aortic atherosclerosis. No enlarged abdominal or pelvic lymph nodes. Reproductive: Status post hysterectomy. No adnexal masses. Other: Trace free fluid within the bilateral paracolic gutters. No free intraperitoneal gas. Postsurgical changes from prior ventral hernia repair. Musculoskeletal: No acute or destructive bony abnormalities. Reconstructed images demonstrate no additional findings. IMPRESSION: 1. Prominent bowel distension extending from the mid jejunum through the rectum, with numerous small bowel gas fluid levels identified. Without clear transition point, and given the diffuse distension of the entirety of the distal bowel, ileus is favored over distal colonic obstruction. 2. Interval development of intrahepatic and extrahepatic biliary duct dilation, without clear cause for obstruction or evidence of choledocholithiasis. Please correlate with liver function tests. 3. Cholelithiasis without evidence of acute cholecystitis. 4. Trace free fluid within the bilateral paracolic gutters. 5.  Aortic Atherosclerosis (ICD10-I70.0). Electronically Signed   By: Sharlet Salina M.D.   On: 03/06/2023 19:59    Pending Labs Unresulted Labs (From admission, onward)     Start     Ordered   03/07/23 0500  Comprehensive metabolic panel  Tomorrow morning,   R        03/06/23 2055   03/07/23 0500  CBC  Tomorrow  morning,   R        03/06/23 2055            Vitals/Pain Today's Vitals  03/06/23 1741 03/06/23 1745 03/06/23 1858 03/06/23 2030  BP:  132/83  139/71  Pulse:  71  76  Resp:  17  18  Temp:  98 F (36.7 C)  98.1 F (36.7 C)  TempSrc:    Oral  SpO2:  100%  100%  Weight:      Height:      PainSc: 9   3  8      Isolation Precautions No active isolations  Medications Medications  nystatin cream (MYCOSTATIN) (has no administration in time range)  atorvastatin (LIPITOR) tablet 10 mg (has no administration in time range)  aspirin EC tablet 81 mg (has no administration in time range)  ALPRAZolam (XANAX) tablet 0.25 mg (has no administration in time range)  albuterol (PROVENTIL) (2.5 MG/3ML) 0.083% nebulizer solution 2.5 mg (has no administration in time range)  lactated ringers infusion (has no administration in time range)  morphine (PF) 2 MG/ML injection 2-4 mg (has no administration in time range)  enoxaparin (LOVENOX) injection 40 mg (has no administration in time range)  acetaminophen (TYLENOL) tablet 650 mg (has no administration in time range)    Or  acetaminophen (TYLENOL) suppository 650 mg (has no administration in time range)  ondansetron (ZOFRAN) tablet 4 mg (has no administration in time range)    Or  ondansetron (ZOFRAN) injection 4 mg (has no administration in time range)  fentaNYL (SUBLIMAZE) injection 25 mcg (25 mcg Intravenous Given 03/06/23 1741)  ondansetron (ZOFRAN) injection 4 mg (4 mg Intravenous Given 03/06/23 1741)  iohexol (OMNIPAQUE) 350 MG/ML injection 75 mL (75 mLs Intravenous Contrast Given 03/06/23 1820)  lactated ringers bolus 1,000 mL (1,000 mLs Intravenous New Bag/Given 03/06/23 2037)  fentaNYL (SUBLIMAZE) injection 25 mcg (25 mcg Intravenous Given 03/06/23 2102)    Mobility walks     Focused Assessments Cardiac Assessment Handoff:    Lab Results  Component Value Date   TROPONINI <0.30 06/22/2013   No results found for: "DDIMER" Does  the Patient currently have chest pain? No    R Recommendations: See Admitting Provider Note  Report given to:   Additional Notes: cooperative, ambulatory 20RT ac, pain relieved w/meds

## 2023-03-06 NOTE — ED Triage Notes (Signed)
Pt had xray done at [facility] today and was told she had a bowel obstruction and yeast infection. Pt has abd pain . Bloating, NVD. These symptoms have been going on for 3 days. Pt has burning with urination but was told she had no UTI.

## 2023-03-06 NOTE — ED Provider Notes (Signed)
[place] EMERGENCY DEPARTMENT AT [facility] Provider Note   CSN: [id number] Arrival date & time: 03/06/23  1458     History  Chief Complaint  Patient presents with   Abdominal Pain    Katherine Floyd is a 77 y.o. female.  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    Reports 3 days of abdominal pain, distention, nausea, vomiting and diarrhea.  First day had pain and normal stool, yesterday was diarrhea/watery, and today no bowel movements and not passing flatus.  Nausea and vomiting x2 yesterday, x1 today. Not able to eat anything. Pain 10/10 was pacing around the room prior to getting pain medications. No hx of similar pain.   She has a history of constipation but is on Linzess and sees Dr. Loreta Ave GI.  Had an x-ray done at the urgent care which showed gaseous distention of bowel loops, cannot exclude obstruction.  She also reported burning with urination but was told she did not have a UTI at urgent care.  Past Medical History:  Diagnosis Date   Anxiety    Arthritis    with fracture right great toe 08/30/11 from "stepping wrong"   Asthma    Dysrhythmia 06/21/2013   NEW ONSET ATRIAL FIBRILATION/RVR   GERD (gastroesophageal reflux disease)    Hypotension    Kidney stone    Osteoporosis    Polyp of colon, adenomatous    recurrent   Urinary tract infection 08/29/11   Cipro per PCP- states is resolving    Past Surgical History:  Procedure Laterality Date   ABDOMINAL HYSTERECTOMY  1984   BACK SURGERY  1997-1998   fell on ice and snow   BREAST LUMPECTOMY WITH RADIOACTIVE SEED LOCALIZATION Left 08/06/2019   Procedure: LEFT BREAST  LUMPECTOMY WITH RADIOACTIVE SEED LOCALIZATION;  Surgeon: Griselda Miner, MD;  Location: [facility];  Service: General;  Laterality: Left;   BUNIONECTOMY  2002   right foot   EYE SURGERY  2000   cataract extraction with IOL   HAND SURGERY  1994   cyst removed   HEMICOLECTOMY  09/06/11   INCISIONAL HERNIA REPAIR N/A 01/31/2017   Procedure: LAPAROSCOPIC INCISIONAL HERNIA REPAIR WITH MESH;  Surgeon: Avel Peace, MD;  Location: [facility];  Service: General;  Laterality: N/A;   INSERTION OF MESH N/A 01/31/2017   Procedure: INSERTION OF MESH;  Surgeon: Avel Peace, MD;  Location: [facility];  Service: General;  Laterality: N/A;   KNEE SURGERY  1990   POLYPECTOMY     SHOULDER SURGERY  2007   right    TONSILLECTOMY  age of 32   uretheral dilitation       Home Medications Prior to Admission medications   Medication Sig Start Date End Date Taking? Authorizing Provider  acetaminophen (TYLENOL) 500 MG tablet Take 1 mg by mouth as needed.   Yes [provider]  albuterol (PROVENTIL) (2.5 MG/3ML) 0.083% nebulizer solution Take 2.5 mg by nebulization every 6 (six) hours as needed for wheezing or shortness of breath.    Yes [provider]  ALPRAZolam Prudy Feeler) 0.5 MG tablet Take 0.25 mg by mouth 2 (two) times daily as needed for anxiety. Anxiety 06/17/11  Yes [provider]  aspirin EC 81 MG tablet Take 1 tablet (81 mg total) by mouth daily. 04/18/15  Yes Lyn Records, MD  atorvastatin (LIPITOR) 10 MG tablet Take 1 tablet (10 mg total) by mouth at bedtime. 02/13/23  Yes Weaver, Scott T, PA-C  B Complex Vitamins (VITAMIN B-COMPLEX) TABS Take 1 tablet by mouth daily.   Yes [provider]  budesonide-formoterol (SYMBICORT) 160-4.5 MCG/ACT inhaler Inhale 2 puffs into the lungs 2 (two) times daily. 07/09/16  Yes [provider]  Calcium Carbonate-Vitamin D (CALCIUM-CARB 600 + D PO) Take 2 tablets by mouth daily.   Yes [provider]   carboxymethylcellulose (REFRESH PLUS) 0.5 % SOLN Place 1 drop into both eyes 2 (two) times daily as needed. Dry eyes   Yes [provider]  cetirizine (ZYRTEC) 10 MG tablet Take 10 mg by mouth daily.   Yes [provider]  fluticasone (FLONASE) 50 MCG/ACT nasal spray Place 2 sprays into both nostrils 2 (two) times daily as needed for allergies.  05/22/16  Yes [provider]  furosemide (LASIX) 40 MG tablet Take 1 tablet (40 mg total) by mouth daily. 02/13/23  Yes Weaver, Scott T, PA-C  LINZESS 145 MCG CAPS capsule Take 145 mcg by mouth every morning.   Yes [provider]  Lutein 6 MG CAPS Take 6 mg by mouth daily.   Yes [provider]  montelukast (SINGULAIR) 10 MG tablet Take 10 mg by mouth at bedtime.  07/17/11  Yes [provider]  omeprazole (PRILOSEC) 20 MG capsule Take 20 mg by mouth daily.   Yes [provider]  potassium chloride SA (KLOR-CON M) 20 MEQ tablet Take 1 tablet (20 mEq total) by mouth daily. 02/12/23  Yes Weaver, Scott T, PA-C  vitamin E 400 UNIT capsule Take 400 Units by mouth daily.   Yes [provider]      Allergies    Elemental sulfur, Linaclotide, Prednisone, Rofecoxib, Ampicillin, Dicyclomine, and Doxycycline    Review of Systems   Review of Systems  Physical Exam Updated Vital Signs BP (!) 107/52 (BP Location: Right Arm)   Pulse 72   Temp 97.9 F (36.6 C) (Oral)   Resp 18   Ht 4\' 11"  (1.499 m)   Wt 49.4 kg   SpO2 100%   BMI 22.02 kg/m  Physical Exam Vitals and nursing note reviewed.  Constitutional:      General: She is not in acute distress.    Appearance: She is well-developed. She is not diaphoretic.  HENT:     Head: Normocephalic and atraumatic.  Eyes:     Conjunctiva/sclera: Conjunctivae normal.  Cardiovascular:     Rate and Rhythm: Normal rate and regular rhythm.     Heart sounds: Normal heart sounds. No murmur heard.    No friction rub. No gallop.  Pulmonary:      Effort: Pulmonary effort is normal. No respiratory distress.     Breath sounds: Normal breath sounds. No wheezing or rales.  Abdominal:     General: There is distension.     Palpations: Abdomen is soft.     Tenderness: There is abdominal tenderness. There is no guarding.  Musculoskeletal:        General: No tenderness.     Cervical back: Normal range of motion.  Skin:    General: Skin is warm and dry.     Findings: No erythema or rash.  Neurological:     Mental Status: She is alert and oriented to person,  place, and time.     ED Results / Procedures / Treatments   Labs (all labs ordered are listed, but only abnormal results are displayed) Labs Reviewed  COMPREHENSIVE METABOLIC PANEL - Abnormal; Notable for the following components:      Result Value   Glucose, Bld 106 (*)    Creatinine, Ser 1.26 (*)    Total Protein 6.4 (*)    GFR, Estimated 44 (*)    All other components within normal limits  CBC WITH DIFFERENTIAL/PLATELET - Abnormal; Notable for the following components:   Hemoglobin 11.9 (*)    All other components within normal limits  COMPREHENSIVE METABOLIC PANEL - Abnormal; Notable for the following components:   Creatinine, Ser 1.14 (*)    Calcium 8.7 (*)    Total Protein 5.4 (*)    Albumin 3.2 (*)    GFR, Estimated 49 (*)    All other components within normal limits  CBC - Abnormal; Notable for the following components:   RBC 3.41 (*)    Hemoglobin 10.0 (*)    HCT 31.3 (*)    All other components within normal limits  LIPASE, BLOOD  URINALYSIS, ROUTINE W REFLEX MICROSCOPIC    EKG None  Radiology CT ABDOMEN PELVIS W CONTRAST  Result Date: 03/06/2023 CLINICAL DATA:  Bowel obstruction suspected EXAM: CT ABDOMEN AND PELVIS WITH CONTRAST TECHNIQUE: Multidetector CT imaging of the abdomen and pelvis was performed using the standard protocol following bolus administration of intravenous contrast. RADIATION DOSE REDUCTION: This exam was performed according to the  departmental dose-optimization program which includes automated exposure control, adjustment of the mA and/or kV according to patient size and/or use of iterative reconstruction technique. CONTRAST:  75mL OMNIPAQUE IOHEXOL 350 MG/ML SOLN COMPARISON:  01/08/2021, 01/08/2023 FINDINGS: Lower chest: No acute pleural or parenchymal lung disease. Hepatobiliary: Calcified gallstones without evidence of acute cholecystitis. Liver parenchyma is unremarkable without focal abnormality. There is intrahepatic and extrahepatic biliary duct dilation, new since prior study, with common bile duct measuring up to 10 mm. No evidence of choledocholithiasis. Pancreas: Unremarkable. No pancreatic ductal dilatation or surrounding inflammatory changes. Spleen: Normal in size without focal abnormality. Adrenals/Urinary Tract: Adrenal glands are unremarkable. Kidneys are normal, without renal calculi, focal lesion, or hydronephrosis. Bladder is unremarkable. Stomach/Bowel: Postsurgical changes from right hemicolectomy are again noted. There is marked gaseous distention of the colon, with a large amount of stool within the rectum. Oval radiopaque foreign bodies are seen within the bowel lumen within the mid jejunum, sigmoid colon, and rectum, likely representing undigested pills. There is prominent distension of the mid jejunum through ileum, with multiple gas fluid levels identified. The stomach, duodenum, and proximal jejunum are fairly decompressed. Maximal diameter of the jejunum measures up to 3.5 cm, with numerous gas fluid levels. No bowel wall thickening or inflammatory change. Vascular/Lymphatic: Aortic atherosclerosis. No enlarged abdominal or pelvic lymph nodes. Reproductive: Status post hysterectomy. No adnexal masses. Other: Trace free fluid within the bilateral paracolic gutters. No free intraperitoneal gas. Postsurgical changes from prior ventral hernia repair. Musculoskeletal: No acute or destructive bony abnormalities.  Reconstructed images demonstrate no additional findings. IMPRESSION: 1. Prominent bowel distension extending from the mid jejunum through the rectum, with numerous small bowel gas fluid levels identified. Without clear transition point, and given the diffuse distension of the entirety of the distal bowel, ileus is favored over distal colonic obstruction. 2. Interval development of intrahepatic and extrahepatic biliary duct dilation, without clear cause for obstruction or evidence of choledocholithiasis. Please correlate with liver  function tests. 3. Cholelithiasis without evidence of acute cholecystitis. 4. Trace free fluid within the bilateral paracolic gutters. 5.  Aortic Atherosclerosis (ICD10-I70.0). Electronically Signed   By: Sharlet Salina M.D.   On: 03/06/2023 19:59    Procedures Procedures    Medications Ordered in ED Medications  nystatin cream (MYCOSTATIN) ( Topical Given 03/07/23 0938)  atorvastatin (LIPITOR) tablet 10 mg (10 mg Oral Given 03/06/23 2112)  aspirin EC tablet 81 mg (81 mg Oral Given 03/07/23 0938)  ALPRAZolam (XANAX) tablet 0.25 mg (has no administration in time range)  albuterol (PROVENTIL) (2.5 MG/3ML) 0.083% nebulizer solution 2.5 mg (has no administration in time range)  lactated ringers infusion ( Intravenous New Bag/Given 03/06/23 2219)  morphine (PF) 2 MG/ML injection 2-4 mg (4 mg Intravenous Given 03/07/23 0042)  enoxaparin (LOVENOX) injection 40 mg (40 mg Subcutaneous Given 03/07/23 0938)  acetaminophen (TYLENOL) tablet 650 mg (has no administration in time range)    Or  acetaminophen (TYLENOL) suppository 650 mg (has no administration in time range)  ondansetron (ZOFRAN) tablet 4 mg (has no administration in time range)    Or  ondansetron (ZOFRAN) injection 4 mg (has no administration in time range)  linaclotide (LINZESS) capsule 145 mcg (has no administration in time range)  fentaNYL (SUBLIMAZE) injection 25 mcg (25 mcg Intravenous Given 03/06/23 1741)   ondansetron (ZOFRAN) injection 4 mg (4 mg Intravenous Given 03/06/23 1741)  iohexol (OMNIPAQUE) 350 MG/ML injection 75 mL (75 mLs Intravenous Contrast Given 03/06/23 1820)  lactated ringers bolus 1,000 mL (1,000 mLs Intravenous New Bag/Given 03/06/23 2037)  fentaNYL (SUBLIMAZE) injection 25 mcg (25 mcg Intravenous Given 03/06/23 2102)    ED Course/ Medical Decision Making/ A&P                                  77 year old female with a history of hypercholesterolemia, chronic heart failure with preserved ejection fraction, paroxysmal atrial fibrillation 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  DDx includes AAA, SBO, large bowel obstruction, volvulus, appendicitis, pancreatitis, cholecystitis, pyelonephritis, nephrolithiasis, diverticulitis.  Labs completed and interpreted by me without signs of UTI, with mild AKI compared to 2018, no sign of hepatitis or pancreatitis, no leukocytosis.  CT abdomen pelvis evaluated by me and concerning for bowel obstruction and concern for volvulus-discussed with General Surgery Dr. Sheliah Hatch and Dr. Elnoria Howard GI and called radiology for expedited read--radiology repots prominent bowel distention from mid jejunum through the rectumsmall bowel gas fluid levels, no clear transition point, diffuse distention favor ileus over distal colonic obstruction.  Development of intrahepatic and extrahepatic biliary dilation, no clear cause of obstruction, no transaminitis or specific RUQ tenderness.    Initially pain severe, pacing around room, is now more comfortable after medications. WIll admit for continued observation, bowel rest with concern for possible distal obstruction, inability to tolerate po.          Final Clinical Impression(s) / ED Diagnoses Final  diagnoses:  Paralytic ileus (HCC)  Colonic obstruction (HCC)    Rx / DC Orders ED Discharge Orders     None         Alvira Monday, MD 03/07/23 1131

## 2023-03-06 NOTE — Assessment & Plan Note (Signed)
Looks like she just takes ASA 81 and no other AC nor rate control meds for this.

## 2023-03-06 NOTE — Assessment & Plan Note (Addendum)
LFTs nl repeat in AM to trend given the bile duct dilation of unclear significance. Need for MRCP? Ill defer that question to GI since it looks like they got called by EDP anyhow. Touch base with them regarding this question if we havent heard back one way or the other before discharge.

## 2023-03-06 NOTE — Assessment & Plan Note (Addendum)
LBO vs ileus. NPO IVF PRN zofran PRN morphine Defer to gen surg if she needs NGT or not Gen surg doesn't think pt has sigmoid volvulus.

## 2023-03-06 NOTE — Consult Note (Signed)
Reason for Consult: vomiting Referring Provider:  Alvira Floyd  Katherine Floyd is an 77 y.o. female.  HPI: 77 yo female has 3 days of abdominal distension, poor PO intake, and difficulty with bowel movements. She has a history of constipation and has been on linzess for 2 months. She took it yesterday and the day before with results but did not take any today because she had some blood from having multiple bowel movements. Pain is better after pain medications here.  Past Medical History:  Diagnosis Date   Anxiety    Arthritis    with fracture right great toe 08/30/11 from "stepping wrong"   Asthma    Dysrhythmia 06/21/2013   NEW ONSET ATRIAL FIBRILATION/RVR   GERD (gastroesophageal reflux disease)    Hypotension    Kidney stone    Osteoporosis    Polyp of colon, adenomatous    recurrent   Urinary tract infection 08/29/11   Cipro per PCP- states is resolving    Past Surgical History:  Procedure Laterality Date   ABDOMINAL HYSTERECTOMY  1984   BACK SURGERY  1997-1998   fell on ice and snow   BREAST LUMPECTOMY WITH RADIOACTIVE SEED LOCALIZATION Left 08/06/2019   Procedure: LEFT BREAST LUMPECTOMY WITH RADIOACTIVE SEED LOCALIZATION;  Surgeon: Griselda Miner, MD;  Location: [facility];  Service: General;  Laterality: Left;   BUNIONECTOMY  2002   right foot   EYE SURGERY  2000   cataract extraction with IOL   HAND SURGERY  1994   cyst removed   HEMICOLECTOMY  09/06/11   INCISIONAL HERNIA REPAIR N/A 01/31/2017   Procedure: LAPAROSCOPIC INCISIONAL HERNIA REPAIR WITH MESH;  Surgeon: Avel Peace, MD;  Location: [facility];  Service: General;  Laterality: N/A;   INSERTION OF MESH N/A 01/31/2017   Procedure: INSERTION OF MESH;  Surgeon: Avel Peace, MD;  Location: [facility];  Service: General;  Laterality: N/A;   KNEE SURGERY  1990   POLYPECTOMY     SHOULDER SURGERY  2007   right    TONSILLECTOMY  age of 1   uretheral dilitation      Family History  Problem  Relation Age of Onset   Cancer Mother        breast   Cancer Cousin        breast   Cancer Maternal Uncle        colon    Social History:  reports that she quit smoking about 34 years ago. Her smoking use included cigarettes. She started smoking about 44 years ago. She has a 15 pack-year smoking history. She has never used smokeless tobacco. She reports that she does not drink alcohol and does not use drugs.  Allergies:  Allergies  Allergen Reactions   Elemental Sulfur Itching   Linaclotide     Other reaction(s): Other (See Comments)  Diarrhea    Prednisone Other (See Comments)    Patient states that 20 mgs of prednisone feel loopy.   Rofecoxib Other (See Comments)    Unknown   Ampicillin Itching, Rash and Other (See Comments)    Unknown   Dicyclomine Rash   Doxycycline Rash    Medications: I have reviewed the patient's current medications.  Results for orders placed or performed during the hospital encounter of 03/06/23 (from the past 48 hour(s))  Comprehensive metabolic panel     Status: Abnormal   Collection Time: 03/06/23  3:33 PM  Result Value Ref Range   Sodium 137 135 -  145 mmol/L   Potassium 3.9 3.5 - 5.1 mmol/L   Chloride 102 98 - 111 mmol/L   CO2 24 22 - 32 mmol/L   Glucose, Bld 106 (H) 70 - 99 mg/dL    Comment: Glucose reference range applies only to samples taken after fasting for at least 8 hours.   BUN 18 8 - 23 mg/dL   Creatinine, Ser 4.09 (H) 0.44 - 1.00 mg/dL   Calcium 81.1 8.9 - 91.4 mg/dL   Total Protein 6.4 (L) 6.5 - 8.1 g/dL   Albumin 3.8 3.5 - 5.0 g/dL   AST 20 15 - 41 U/L   ALT 16 0 - 44 U/L   Alkaline Phosphatase 53 38 - 126 U/L   Total Bilirubin 1.1 <1.2 mg/dL   GFR, Estimated 44 (L) >60 mL/min    Comment: (NOTE) Calculated using the CKD-EPI Creatinine Equation (2021)    Anion gap 11 5 - 15    Comment: Performed at [facility], [address] [place], [place] [address]  Lipase, blood     Status: None   Collection Time: 03/06/23   3:33 PM  Result Value Ref Range   Lipase 34 11 - 51 U/L    Comment: Performed at [facility], [address] [place], [place] [address]  CBC with Diff     Status: Abnormal   Collection Time: 03/06/23  3:33 PM  Result Value Ref Range   WBC 9.6 4.0 - 10.5 K/uL   RBC 3.95 3.87 - 5.11 MIL/uL   Hemoglobin 11.9 (L) 12.0 - 15.0 g/dL   HCT 86.5 78.4 - 69.6 %   MCV 93.9 80.0 - 100.0 fL   MCH 30.1 26.0 - 34.0 pg   MCHC 32.1 30.0 - 36.0 g/dL   RDW 29.5 28.4 - 13.2 %   Platelets 227 150 - 400 K/uL   nRBC 0.0 0.0 - 0.2 %   Neutrophils Relative % 63 %   Neutro Abs 6.0 1.7 - 7.7 K/uL   Lymphocytes Relative 28 %   Lymphs Abs 2.7 0.7 - 4.0 K/uL   Monocytes Relative 8 %   Monocytes Absolute 0.8 0.1 - 1.0 K/uL   Eosinophils Relative 0 %   Eosinophils Absolute 0.0 0.0 - 0.5 K/uL   Basophils Relative 1 %   Basophils Absolute 0.1 0.0 - 0.1 K/uL   Immature Granulocytes 0 %   Abs Immature Granulocytes 0.03 0.00 - 0.07 K/uL    Comment: Performed at [facility], [address] [place], [place] [address]  Urinalysis, Routine w reflex microscopic -Urine, Clean Catch     Status: None   Collection Time: 03/06/23  4:05 PM  Result Value Ref Range   Color, Urine YELLOW YELLOW   APPearance CLEAR CLEAR   Specific Gravity, Urine 1.012 1.005 - 1.030   pH 5.0 5.0 - 8.0   Glucose, UA NEGATIVE NEGATIVE mg/dL   Hgb urine dipstick NEGATIVE NEGATIVE   Bilirubin Urine NEGATIVE NEGATIVE   Ketones, ur NEGATIVE NEGATIVE mg/dL   Protein, ur NEGATIVE NEGATIVE mg/dL   Nitrite NEGATIVE NEGATIVE   Leukocytes,Ua NEGATIVE NEGATIVE    Comment: Performed at [facility], [address], [place], [place] [address]    CT ABDOMEN PELVIS W CONTRAST  Result Date: 03/06/2023 CLINICAL DATA:  Bowel obstruction suspected EXAM: CT ABDOMEN AND PELVIS WITH CONTRAST TECHNIQUE: Multidetector CT imaging of the abdomen and pelvis was performed using the standard protocol following bolus administration of intravenous  contrast. RADIATION DOSE REDUCTION: This exam was performed according to the departmental dose-optimization program which includes automated exposure control, adjustment of the mA and/or kV according to patient size and/or use of iterative reconstruction technique. CONTRAST:  75mL OMNIPAQUE IOHEXOL 350 MG/ML SOLN COMPARISON:  01/08/2021, 01/08/2023 FINDINGS: Lower chest: No acute pleural or parenchymal lung disease. Hepatobiliary: Calcified gallstones without evidence of acute cholecystitis. Liver parenchyma is unremarkable without focal abnormality. There is intrahepatic and extrahepatic biliary duct dilation, new since prior study, with common bile duct measuring up to 10 mm. No evidence of choledocholithiasis. Pancreas: Unremarkable. No pancreatic ductal dilatation or surrounding inflammatory changes. Spleen: Normal in size without focal abnormality. Adrenals/Urinary Tract: Adrenal glands are unremarkable. Kidneys are normal, without renal calculi, focal lesion, or hydronephrosis. Bladder is unremarkable. Stomach/Bowel: Postsurgical changes from right hemicolectomy are again noted. There is marked gaseous distention of the colon, with a large amount of stool within the rectum. Oval radiopaque foreign bodies are seen within the bowel lumen within the mid jejunum, sigmoid colon, and rectum, likely representing undigested pills. There is prominent distension of the mid jejunum through ileum, with multiple gas fluid levels identified. The stomach, duodenum, and proximal jejunum are fairly decompressed. Maximal diameter of the jejunum measures up to 3.5 cm, with numerous gas fluid levels. No bowel wall thickening or inflammatory change. Vascular/Lymphatic: Aortic atherosclerosis. No enlarged abdominal or pelvic lymph nodes. Reproductive: Status post hysterectomy. No adnexal masses. Other: Trace free fluid within the bilateral paracolic gutters. No free intraperitoneal gas. Postsurgical changes from prior ventral  hernia repair. Musculoskeletal: No acute or destructive bony abnormalities. Reconstructed images demonstrate no additional findings. IMPRESSION: 1. Prominent bowel distension extending from the mid jejunum through the rectum, with numerous small bowel gas fluid levels identified. Without clear transition point, and given the diffuse distension of the entirety of the distal bowel, ileus is favored over distal colonic obstruction. 2. Interval development of intrahepatic and extrahepatic biliary duct dilation, without clear cause for obstruction or evidence of choledocholithiasis. Please correlate with liver function tests. 3. Cholelithiasis without evidence of acute cholecystitis. 4. Trace free fluid within the bilateral paracolic gutters. 5.  Aortic Atherosclerosis (ICD10-I70.0). Electronically Signed   By: Sharlet Salina M.D.   On: 03/06/2023 19:59    Review of Systems  Constitutional: Negative.   HENT: Negative.    Eyes: Negative.   Respiratory: Negative.    Cardiovascular: Negative.   Gastrointestinal:  Positive for abdominal pain, constipation, nausea and vomiting.  Genitourinary: Negative.   Musculoskeletal: Negative.   Skin: Negative.   Neurological: Negative.   Endo/Heme/Allergies: Negative.   Psychiatric/Behavioral: Negative.      PE Blood pressure 139/71, pulse 76, temperature 98.1 F (36.7 C), temperature source Oral, resp. rate 18, height 4\' 11"  (1.499 m), weight 49.4 kg, SpO2 100%. Constitutional: NAD; conversant; no deformities Eyes: Moist conjunctiva; no lid lag; anicteric; PERRL Neck: Trachea midline; no thyromegaly Lungs: Normal respiratory effort; no tactile fremitus CV: RRR; no palpable thrills; no pitting edema GI: Abd distended, no guarding or tenderness; no palpable hepatosplenomegaly MSK: Normal gait; no clubbing/cyanosis Psychiatric: Appropriate affect; alert and oriented x3 Lymphatic: No palpable cervical or axillary lymphadenopathy Skin: No major subcutaneous  nodules. Warm and dry   Assessment/Plan: 77 yo female with chronic constipation with worsening abdominal distension. CT scan shows dilation of small intestine and large intestine. I initially thought it could be sigmoid volvulus but the rectum seems to lead directly into sigmoid colon without twist. -This does not appear to be bowel obstruction -but  due to level of distension could be significant medical ileus or ogolvie's -I think admission, hydration, electrolyte replacement would reasonable next steps  I reviewed last 24 h vitals and pain scores, last 48 h intake and output, last 24 h labs and trends, and last 24 h imaging results.  This care required high  level of medical decision making.   De Blanch Brilynn Biasi 03/06/2023, 8:55 PM

## 2023-03-07 ENCOUNTER — Observation Stay (HOSPITAL_COMMUNITY): Payer: Medicare HMO

## 2023-03-07 DIAGNOSIS — I480 Paroxysmal atrial fibrillation: Secondary | ICD-10-CM | POA: Diagnosis not present

## 2023-03-07 DIAGNOSIS — K839 Disease of biliary tract, unspecified: Secondary | ICD-10-CM | POA: Diagnosis not present

## 2023-03-07 DIAGNOSIS — K8020 Calculus of gallbladder without cholecystitis without obstruction: Secondary | ICD-10-CM | POA: Diagnosis not present

## 2023-03-07 DIAGNOSIS — K56609 Unspecified intestinal obstruction, unspecified as to partial versus complete obstruction: Secondary | ICD-10-CM | POA: Diagnosis not present

## 2023-03-07 DIAGNOSIS — I5032 Chronic diastolic (congestive) heart failure: Secondary | ICD-10-CM | POA: Diagnosis not present

## 2023-03-07 LAB — CBC
HCT: 31.3 % — ABNORMAL LOW (ref 36.0–46.0)
Hemoglobin: 10 g/dL — ABNORMAL LOW (ref 12.0–15.0)
MCH: 29.3 pg (ref 26.0–34.0)
MCHC: 31.9 g/dL (ref 30.0–36.0)
MCV: 91.8 fL (ref 80.0–100.0)
Platelets: 168 10*3/uL (ref 150–400)
RBC: 3.41 MIL/uL — ABNORMAL LOW (ref 3.87–5.11)
RDW: 13.4 % (ref 11.5–15.5)
WBC: 7 10*3/uL (ref 4.0–10.5)
nRBC: 0 % (ref 0.0–0.2)

## 2023-03-07 LAB — COMPREHENSIVE METABOLIC PANEL
ALT: 13 U/L (ref 0–44)
AST: 16 U/L (ref 15–41)
Albumin: 3.2 g/dL — ABNORMAL LOW (ref 3.5–5.0)
Alkaline Phosphatase: 43 U/L (ref 38–126)
Anion gap: 9 (ref 5–15)
BUN: 18 mg/dL (ref 8–23)
CO2: 24 mmol/L (ref 22–32)
Calcium: 8.7 mg/dL — ABNORMAL LOW (ref 8.9–10.3)
Chloride: 106 mmol/L (ref 98–111)
Creatinine, Ser: 1.14 mg/dL — ABNORMAL HIGH (ref 0.44–1.00)
GFR, Estimated: 49 mL/min — ABNORMAL LOW (ref >60)
Glucose, Bld: 73 mg/dL (ref 70–99)
Potassium: 3.7 mmol/L (ref 3.5–5.1)
Sodium: 139 mmol/L (ref 135–145)
Total Bilirubin: 1 mg/dL (ref <1.2)
Total Protein: 5.4 g/dL — ABNORMAL LOW (ref 6.5–8.1)

## 2023-03-07 MED ORDER — OXYCODONE HCL 5 MG PO TABS
5.0000 mg | ORAL_TABLET | Freq: Four times a day (QID) | ORAL | Status: DC | PRN
  Administered 2023-03-07 – 2023-03-10 (×3): 5 mg via ORAL
  Filled 2023-03-07 (×4): qty 1

## 2023-03-07 MED ORDER — ENOXAPARIN SODIUM 30 MG/0.3ML IJ SOSY
30.0000 mg | PREFILLED_SYRINGE | INTRAMUSCULAR | Status: DC
  Administered 2023-03-08 – 2023-03-11 (×3): 30 mg via SUBCUTANEOUS
  Filled 2023-03-07 (×3): qty 0.3

## 2023-03-07 MED ORDER — LINACLOTIDE 145 MCG PO CAPS
145.0000 ug | ORAL_CAPSULE | Freq: Every day | ORAL | Status: DC
  Administered 2023-03-08 – 2023-03-11 (×2): 145 ug via ORAL
  Filled 2023-03-07 (×6): qty 1

## 2023-03-07 MED ORDER — GADOBUTROL 1 MMOL/ML IV SOLN
4.0000 mL | Freq: Once | INTRAVENOUS | Status: AC | PRN
  Administered 2023-03-07: 4 mL via INTRAVENOUS

## 2023-03-07 NOTE — Progress Notes (Signed)
   03/07/23 1020  Mobility  Activity Ambulated independently in hallway  Level of Assistance Standby assist, set-up cues, supervision of patient - no hands on  Assistive Device Other (Comment) (IV Pole)  Distance Ambulated (ft) 600 ft  Activity Response Tolerated fair  Mobility Referral Yes  $Mobility charge 1 Mobility  Mobility Specialist Start Time (ACUTE ONLY) 1007  Mobility Specialist Stop Time (ACUTE ONLY) 1020  Mobility Specialist Time Calculation (min) (ACUTE ONLY) 13 min   Mobility Specialist: Progress Note  Post-Mobility: HR 78, SPO2 98%  Pt agreeable to mobility session - received ambulating in room. Required SV throughout using IV Pole. C/o abd pain rated 3/10. Returned to standing in room with all needs met - call bell within reach.   Pt states she remains active at home.   Barnie Mort, BS Mobility Specialist Please contact via SecureChat or Rehab office at [phone].

## 2023-03-07 NOTE — Progress Notes (Signed)
[facility]  PROGRESS NOTE  Katherine Floyd ZOX:[id number] DOB: 02-Apr-1946 DOA: 03/06/2023 PCP: Richmond Campbell., PA-C   Brief HPI:   77 y.o. female with medical history significant of PAF not on AC, bowel surgery in past.  Pt in to ED with N/V/D, abd pain.  Ongoing for past 3 days.  X ray done at [facility] today.  Pt sent in to ED for concern for SBO.  CT scan in ED = LBO vs ileus.  Also bile duct dilation of unclear significance with nl LFTs.  Chronic constipation.    Assessment/Plan:   Ileus versus ogolvie's  -General Surgery has signed off -Gastroenterology following -Linzess started per GI  Biliary ductal dilatation -Seen on CT scan, both intra and extrahepatic ductal dilatation without evidence of choledocholithiasis -LFTs are normal -GI following  Paroxysmal atrial fibrillation -Patient is only taking aspirin 81 mg daily -Not on rate control medications  Chronic diastolic heart failure with preserved ejection fraction -Continue to hold Lasix    Medications     aspirin EC  81 mg Oral Daily   atorvastatin  10 mg Oral QHS   enoxaparin (LOVENOX) injection  40 mg Subcutaneous Q24H   [START ON 03/08/2023] linaclotide  145 mcg Oral QAC breakfast   nystatin cream   Topical BID     Data Reviewed:   CBG:  No results for input(s): "GLUCAP" in the last 168 hours.  SpO2: 100 %    Vitals:   03/06/23 2210 03/07/23 0310 03/07/23 0420 03/07/23 0915  BP: (!) 139/94 (!) 108/53 127/68 (!) 107/52  Pulse: 84 71 72 72  Resp: 16 16 16 18   Temp: 98.4 F (36.9 C) 97.8 F (36.6 C) 97.6 F (36.4 C) 97.9 F (36.6 C)  TempSrc: Oral Oral Oral Oral  SpO2: 97% 97% 98% 100%  Weight:      Height:          Data Reviewed:  Basic Metabolic Panel: Recent Labs  Lab 03/06/23 1533 03/07/23 0636  NA 137 139  K 3.9 3.7  CL 102 106  CO2 24 24  GLUCOSE 106* 73  BUN 18 18  CREATININE 1.26* 1.14*  CALCIUM 10.0 8.7*    CBC: Recent Labs  Lab 03/06/23 1533  03/07/23 0636  WBC 9.6 7.0  NEUTROABS 6.0  --   HGB 11.9* 10.0*  HCT 37.1 31.3*  MCV 93.9 91.8  PLT 227 168    LFT Recent Labs  Lab 03/06/23 1533 03/07/23 0636  AST 20 16  ALT 16 13  ALKPHOS 53 43  BILITOT 1.1 1.0  PROT 6.4* 5.4*  ALBUMIN 3.8 3.2*     Antibiotics: Anti-infectives (From admission, onward)    None        DVT prophylaxis: Lovenox  Code Status: Full code  Family Communication:    CONSULTS General Surgery, gastroenterology   Subjective   Still has abdominal bloating, has not passed any gas.   Objective    Physical Examination:   General-appears in no acute distress Heart-S1-S2, regular, no murmur auscultated Lungs-clear to auscultation bilaterally, no wheezing or crackles auscultated Abdomen-soft, nontender, distended, no organomegaly Extremities-no edema in the lower extremities Neuro-alert, oriented x3, no focal deficit noted  Status is: Inpatient:             Meredeth Ide   [facility] If 7PM-7AM, please contact night-coverage at [email or web address], Office  [phone]   03/07/2023, 9:27 AM  LOS: 0 days

## 2023-03-07 NOTE — Consult Note (Signed)
Reason for Consult: Ileus Referring Physician: [facility]  Burnett Harry HPI: This is a 77 year old female with a PMH of constipation, asthma, GERD, osteoporosis, and renal stones admitted for a small bowel and colonic ileus.  She states that her symptoms started rather acutely 3 days prior to admission.  The patient experienced abdominal distension, nausea, vomiting, and diarrhea.  As the day progressed she did not have any further diarrhea, but she did have episodes of nausea and vomiting.  The patient presented to urgent care and an abdominal X-ray was performed.  There was the possibility of an obstruction and she was referred to the ER.  In the ER she was in a significant amount of pain, but the application of pain medication markedly improved her symptoms.  In comparison to yesterday she denies any further pain, and she reports a significant reduction in her abdominal distension.  The CT revealed that her jejunum was 3.5 cm and there was stool in the rectum.  There was no evidence of any obstruction.  Currently she does not report passing any stool or flatus.  The CT scan also incidentally identified calcified gallstones in the gallbladder and nowhere else in the GI tract.  Past Medical History:  Diagnosis Date   Anxiety    Arthritis    with fracture right great toe 08/30/11 from "stepping wrong"   Asthma    Dysrhythmia 06/21/2013   NEW ONSET ATRIAL FIBRILATION/RVR   GERD (gastroesophageal reflux disease)    Hypotension    Kidney stone    Osteoporosis    Polyp of colon, adenomatous    recurrent   Urinary tract infection 08/29/11   Cipro per PCP- states is resolving    Past Surgical History:  Procedure Laterality Date   ABDOMINAL HYSTERECTOMY  1984   BACK SURGERY  1997-1998   fell on ice and snow   BREAST LUMPECTOMY WITH RADIOACTIVE SEED LOCALIZATION Left 08/06/2019   Procedure: LEFT BREAST LUMPECTOMY WITH RADIOACTIVE SEED LOCALIZATION;  Surgeon: Griselda Miner, MD;  Location:  [facility];  Service: General;  Laterality: Left;   BUNIONECTOMY  2002   right foot   EYE SURGERY  2000   cataract extraction with IOL   HAND SURGERY  1994   cyst removed   HEMICOLECTOMY  09/06/11   INCISIONAL HERNIA REPAIR N/A 01/31/2017   Procedure: LAPAROSCOPIC INCISIONAL HERNIA REPAIR WITH MESH;  Surgeon: Avel Peace, MD;  Location: [facility];  Service: General;  Laterality: N/A;   INSERTION OF MESH N/A 01/31/2017   Procedure: INSERTION OF MESH;  Surgeon: Avel Peace, MD;  Location: [facility];  Service: General;  Laterality: N/A;   KNEE SURGERY  1990   POLYPECTOMY     SHOULDER SURGERY  2007   right    TONSILLECTOMY  age of 77   uretheral dilitation      Family History  Problem Relation Age of Onset   Cancer Mother        breast   Cancer Cousin        breast   Cancer Maternal Uncle        colon    Social History:  reports that she quit smoking about 34 years ago. Her smoking use included cigarettes. She started smoking about 44 years ago. She has a 15 pack-year smoking history. She has never used smokeless tobacco. She reports that she does not drink alcohol and does not use drugs.  Allergies:  Allergies  Allergen Reactions  Elemental Sulfur Itching   Linaclotide     Other reaction(s): Other (See Comments)  Diarrhea    Prednisone Other (See Comments)    Patient states that 20 mgs of prednisone feel loopy.   Rofecoxib Other (See Comments)    Unknown   Ampicillin Itching, Rash and Other (See Comments)    Unknown   Dicyclomine Rash   Doxycycline Rash    Medications: Scheduled:  aspirin EC  81 mg Oral Daily   atorvastatin  10 mg Oral QHS   [START ON 03/08/2023] enoxaparin (LOVENOX) injection  30 mg Subcutaneous Q24H   [START ON 03/08/2023] linaclotide  145 mcg Oral QAC breakfast   nystatin cream   Topical BID   Continuous:  lactated ringers 75 mL/hr at 03/07/23 1204    Results for orders placed or performed during the hospital encounter of  03/06/23 (from the past 24 hour(s))  Comprehensive metabolic panel     Status: Abnormal   Collection Time: 03/07/23  6:36 AM  Result Value Ref Range   Sodium 139 135 - 145 mmol/L   Potassium 3.7 3.5 - 5.1 mmol/L   Chloride 106 98 - 111 mmol/L   CO2 24 22 - 32 mmol/L   Glucose, Bld 73 70 - 99 mg/dL   BUN 18 8 - 23 mg/dL   Creatinine, Ser 7.82 (H) 0.44 - 1.00 mg/dL   Calcium 8.7 (L) 8.9 - 10.3 mg/dL   Total Protein 5.4 (L) 6.5 - 8.1 g/dL   Albumin 3.2 (L) 3.5 - 5.0 g/dL   AST 16 15 - 41 U/L   ALT 13 0 - 44 U/L   Alkaline Phosphatase 43 38 - 126 U/L   Total Bilirubin 1.0 <1.2 mg/dL   GFR, Estimated 49 (L) >60 mL/min   Anion gap 9 5 - 15  CBC     Status: Abnormal   Collection Time: 03/07/23  6:36 AM  Result Value Ref Range   WBC 7.0 4.0 - 10.5 K/uL   RBC 3.41 (L) 3.87 - 5.11 MIL/uL   Hemoglobin 10.0 (L) 12.0 - 15.0 g/dL   HCT 95.6 (L) 21.3 - 08.6 %   MCV 91.8 80.0 - 100.0 fL   MCH 29.3 26.0 - 34.0 pg   MCHC 31.9 30.0 - 36.0 g/dL   RDW 57.8 46.9 - 62.9 %   Platelets 168 150 - 400 K/uL   nRBC 0.0 0.0 - 0.2 %     CT ABDOMEN PELVIS W CONTRAST  Result Date: 03/06/2023 CLINICAL DATA:  Bowel obstruction suspected EXAM: CT ABDOMEN AND PELVIS WITH CONTRAST TECHNIQUE: Multidetector CT imaging of the abdomen and pelvis was performed using the standard protocol following bolus administration of intravenous contrast. RADIATION DOSE REDUCTION: This exam was performed according to the departmental dose-optimization program which includes automated exposure control, adjustment of the mA and/or kV according to patient size and/or use of iterative reconstruction technique. CONTRAST:  75mL OMNIPAQUE IOHEXOL 350 MG/ML SOLN COMPARISON:  01/08/2021, 01/08/2023 FINDINGS: Lower chest: No acute pleural or parenchymal lung disease. Hepatobiliary: Calcified gallstones without evidence of acute cholecystitis. Liver parenchyma is unremarkable without focal abnormality. There is intrahepatic and extrahepatic  biliary duct dilation, new since prior study, with common bile duct measuring up to 10 mm. No evidence of choledocholithiasis. Pancreas: Unremarkable. No pancreatic ductal dilatation or surrounding inflammatory changes. Spleen: Normal in size without focal abnormality. Adrenals/Urinary Tract: Adrenal glands are unremarkable. Kidneys are normal, without renal calculi, focal lesion, or hydronephrosis. Bladder is unremarkable. Stomach/Bowel: Postsurgical changes from right  hemicolectomy are again noted. There is marked gaseous distention of the colon, with a large amount of stool within the rectum. Oval radiopaque foreign bodies are seen within the bowel lumen within the mid jejunum, sigmoid colon, and rectum, likely representing undigested pills. There is prominent distension of the mid jejunum through ileum, with multiple gas fluid levels identified. The stomach, duodenum, and proximal jejunum are fairly decompressed. Maximal diameter of the jejunum measures up to 3.5 cm, with numerous gas fluid levels. No bowel wall thickening or inflammatory change. Vascular/Lymphatic: Aortic atherosclerosis. No enlarged abdominal or pelvic lymph nodes. Reproductive: Status post hysterectomy. No adnexal masses. Other: Trace free fluid within the bilateral paracolic gutters. No free intraperitoneal gas. Postsurgical changes from prior ventral hernia repair. Musculoskeletal: No acute or destructive bony abnormalities. Reconstructed images demonstrate no additional findings. IMPRESSION: 1. Prominent bowel distension extending from the mid jejunum through the rectum, with numerous small bowel gas fluid levels identified. Without clear transition point, and given the diffuse distension of the entirety of the distal bowel, ileus is favored over distal colonic obstruction. 2. Interval development of intrahepatic and extrahepatic biliary duct dilation, without clear cause for obstruction or evidence of choledocholithiasis. Please correlate  with liver function tests. 3. Cholelithiasis without evidence of acute cholecystitis. 4. Trace free fluid within the bilateral paracolic gutters. 5.  Aortic Atherosclerosis (ICD10-I70.0). Electronically Signed   By: Sharlet Salina M.D.   On: 03/06/2023 19:59    ROS:  As stated above in the HPI otherwise negative.  Blood pressure (!) 99/59, pulse 79, temperature 98.2 F (36.8 C), temperature source Oral, resp. rate 18, height 4\' 11"  (1.499 m), weight 49.4 kg, SpO2 98%.    PE: Gen: NAD, Alert and Oriented HEENT:  [place]/AT, EOMI Neck: Supple, no LAD Lungs: CTA Bilaterally CV: RRR without M/G/R ABD: Soft, distended, +BS - minimal, tympanic, nontender Ext: No C/C/E  Assessment/Plan: 1) Ileus. 2) Nausea/vomiting. 3) Abdominal pain. 4) History of constipation. 5) Dilated intra and extrahepatic ducts. 6) Gallstones without any liver enzyme elevations.   The current examination does not show an acute abdomen.  She reports feeling much better at this time, but her abdomen is still distended.  She does not report any problems with nausea or vomiting currently and she did not use any further pain medications since last evening.  The patient does not report any passage of stool or flatus.  Her electrolytes appear to be relatively stable.  As for her dilated ducts there was no obvious choledocholithiasis or any calcified stones in the small bowel to suggest a gallstone ileus.  The liver enzymes were not elevated and there was no evidence of pneumobilia.  Her liver enzymes were also normal.  Plan: 1) She was instructed to turn side to side every 15 minutes. 2) Ambulate when possible. 3) MRCP to work up the dilated ducts. 4) Follow liver panel. 5) [facility] will cover this weekend. Nataki Mccrumb D 03/07/2023, 5:54 PM

## 2023-03-07 NOTE — Progress Notes (Signed)
Patient ID: Katherine Floyd, female   DOB: [birth year], 77 y.o.   MRN: [id number] Suspect ileus or ogolvie's, no surgical issues. Recommend GI evaluation. We will sign off, please call with questions or concerns.

## 2023-03-07 NOTE — Progress Notes (Signed)
   03/07/23 1357  Mobility  Activity Ambulated independently in hallway  Level of Assistance Standby assist, set-up cues, supervision of patient - no hands on  Assistive Device Other (Comment) (Iv Pole)  Distance Ambulated (ft) 600 ft  Activity Response Tolerated fair  Mobility Referral Yes  $Mobility charge 1 Mobility  Mobility Specialist Start Time (ACUTE ONLY) 1343  Mobility Specialist Stop Time (ACUTE ONLY) 1357  Mobility Specialist Time Calculation (min) (ACUTE ONLY) 14 min   Mobility Specialist: Progress Note  Pt requested mobility session - received in bed. Required SV using IV Pole. C/o abd pain rated 4/10. Returned to bed with all needs met - call bell within reach.   Barnie Mort, BS Mobility Specialist Please contact via SecureChat or Rehab office at [phone].

## 2023-03-08 DIAGNOSIS — R935 Abnormal findings on diagnostic imaging of other abdominal regions, including retroperitoneum: Secondary | ICD-10-CM

## 2023-03-08 DIAGNOSIS — K567 Ileus, unspecified: Secondary | ICD-10-CM | POA: Diagnosis not present

## 2023-03-08 DIAGNOSIS — K838 Other specified diseases of biliary tract: Secondary | ICD-10-CM

## 2023-03-08 MED ORDER — MONTELUKAST SODIUM 10 MG PO TABS
10.0000 mg | ORAL_TABLET | Freq: Every day | ORAL | Status: AC
  Administered 2023-03-08: 10 mg via ORAL
  Filled 2023-03-08: qty 1

## 2023-03-08 MED ORDER — LACTATED RINGERS IV SOLN: INTRAVENOUS | Status: DC

## 2023-03-08 NOTE — Progress Notes (Addendum)
Progress Note  Primary GI: Dr. Elnoria Howard  LOS: 0 days   Chief Complaint: Ileus   Subjective   Patient states she feels her belly is more distended than it was yesterday.  She is having adequate bowel movements and passing gas.  She had a bowel movement this morning after being given her Linzess.  Has some mild abdominal tenderness.    Objective   Vital signs in last 24 hours: Temp:  [98 F (36.7 C)-98.5 F (36.9 C)] 98.1 F (36.7 C) (11/09 0750) Pulse Rate:  [75-82] 82 (11/09 0750) Resp:  [16-18] 16 (11/09 0750) BP: (99-122)/(50-68) 122/68 (11/09 0750) SpO2:  [94 %-98 %] 98 % (11/09 0750) Last BM Date : 03/06/23 Last BM recorded by nurses in past 5 days No data recorded  General:   female in no acute distress  Heart:  Regular rate and rhythm; no murmurs Pulm: Clear anteriorly; no wheezing Abdomen: Somewhat distended, generalized tenderness, normoactive bowel sounds. Extremities:  No edema Neurologic:  Alert and  oriented x4;  No focal deficits.  Psych:  Cooperative. Normal mood and affect.  Intake/Output from previous day: 11/08 0701 - 11/09 0700 In: 1131.2 [P.O.:50; I.V.:1081.2] Out: -  Intake/Output this shift: No intake/output data recorded.  Studies/Results: MR ABDOMEN MRCP W WO CONTAST  Result Date: 03/08/2023 CLINICAL DATA:  Cholelithiasis, abdominal pain for 3 days new biliary ductal dilatation by prior CT EXAM: MRI ABDOMEN WITHOUT AND WITH CONTRAST (INCLUDING MRCP) TECHNIQUE: Multiplanar multisequence MR imaging of the abdomen was performed both before and after the administration of intravenous contrast. Heavily T2-weighted images of the biliary and pancreatic ducts were obtained, and three-dimensional MRCP images were rendered by post processing. CONTRAST:  4mL GADAVIST GADOBUTROL 1 MMOL/ML IV SOLN COMPARISON:  CT abdomen pelvis, 03/06/2023 FINDINGS: Examination is generally limited by breath motion artifact. Lower chest: No acute abnormality. Hepatobiliary: No  solid liver abnormality is seen. Sludge and gravel contracted in the gallbladder. Severe intra and extrahepatic biliary ductal dilatation, the common bile duct measuring up to 1.2 cm in caliber. Layering sludge at the ampulla without clearly visualized gallstones (series 3, image 14). Pancreas: Mildly prominent pancreatic duct measuring up to 0.3 cm in caliber although tapering smoothly to the ampulla. No overt pancreatic ductal dilatation or surrounding inflammatory changes. Spleen: Normal in size without significant abnormality. Adrenals/Urinary Tract: Adrenal glands are unremarkable. Kidneys are normal, without renal calculi, solid lesion, or hydronephrosis. Stomach/Bowel: Stomach is within normal limits. The partially included colon is diffusely distended by gas and fluid, better imaged by recent CT. Vascular/Lymphatic: Aortic atherosclerosis. No enlarged abdominal lymph nodes. Other: No abdominal wall hernia. Anasarca. Trace perihepatic ascites. Musculoskeletal: No acute or significant osseous findings. IMPRESSION: 1. Examination limited by breath motion artifact. Within this limitation, severe intra and extrahepatic biliary ductal dilatation, the common bile duct measuring up to 1.2 cm in caliber. Layering sludge at the ampulla without clearly visualized gallstones. Consider ERCP. 2. Sludge and gravel contracted in the gallbladder. 3. Trace ascites and anasarca. 4. The partially included colon is diffusely distended by gas and fluid, better imaged by recent CT. Electronically Signed   By: Jearld Lesch M.D.   On: 03/08/2023 06:44   CT ABDOMEN PELVIS W CONTRAST  Result Date: 03/06/2023 CLINICAL DATA:  Bowel obstruction suspected EXAM: CT ABDOMEN AND PELVIS WITH CONTRAST TECHNIQUE: Multidetector CT imaging of the abdomen and pelvis was performed using the standard protocol following bolus administration of intravenous contrast. RADIATION DOSE REDUCTION: This exam was performed according to the  departmental  dose-optimization program which includes automated exposure control, adjustment of the mA and/or kV according to patient size and/or use of iterative reconstruction technique. CONTRAST:  75mL OMNIPAQUE IOHEXOL 350 MG/ML SOLN COMPARISON:  01/08/2021, 01/08/2023 FINDINGS: Lower chest: No acute pleural or parenchymal lung disease. Hepatobiliary: Calcified gallstones without evidence of acute cholecystitis. Liver parenchyma is unremarkable without focal abnormality. There is intrahepatic and extrahepatic biliary duct dilation, new since prior study, with common bile duct measuring up to 10 mm. No evidence of choledocholithiasis. Pancreas: Unremarkable. No pancreatic ductal dilatation or surrounding inflammatory changes. Spleen: Normal in size without focal abnormality. Adrenals/Urinary Tract: Adrenal glands are unremarkable. Kidneys are normal, without renal calculi, focal lesion, or hydronephrosis. Bladder is unremarkable. Stomach/Bowel: Postsurgical changes from right hemicolectomy are again noted. There is marked gaseous distention of the colon, with a large amount of stool within the rectum. Oval radiopaque foreign bodies are seen within the bowel lumen within the mid jejunum, sigmoid colon, and rectum, likely representing undigested pills. There is prominent distension of the mid jejunum through ileum, with multiple gas fluid levels identified. The stomach, duodenum, and proximal jejunum are fairly decompressed. Maximal diameter of the jejunum measures up to 3.5 cm, with numerous gas fluid levels. No bowel wall thickening or inflammatory change. Vascular/Lymphatic: Aortic atherosclerosis. No enlarged abdominal or pelvic lymph nodes. Reproductive: Status post hysterectomy. No adnexal masses. Other: Trace free fluid within the bilateral paracolic gutters. No free intraperitoneal gas. Postsurgical changes from prior ventral hernia repair. Musculoskeletal: No acute or destructive bony abnormalities. Reconstructed  images demonstrate no additional findings. IMPRESSION: 1. Prominent bowel distension extending from the mid jejunum through the rectum, with numerous small bowel gas fluid levels identified. Without clear transition point, and given the diffuse distension of the entirety of the distal bowel, ileus is favored over distal colonic obstruction. 2. Interval development of intrahepatic and extrahepatic biliary duct dilation, without clear cause for obstruction or evidence of choledocholithiasis. Please correlate with liver function tests. 3. Cholelithiasis without evidence of acute cholecystitis. 4. Trace free fluid within the bilateral paracolic gutters. 5.  Aortic Atherosclerosis (ICD10-I70.0). Electronically Signed   By: Sharlet Salina M.D.   On: 03/06/2023 19:59    Lab Results: Recent Labs    03/06/23 1533 03/07/23 0636  WBC 9.6 7.0  HGB 11.9* 10.0*  HCT 37.1 31.3*  PLT 227 168   BMET Recent Labs    03/06/23 1533 03/07/23 0636  NA 137 139  K 3.9 3.7  CL 102 106  CO2 24 24  GLUCOSE 106* 73  BUN 18 18  CREATININE 1.26* 1.14*  CALCIUM 10.0 8.7*   LFT Recent Labs    03/07/23 0636  PROT 5.4*  ALBUMIN 3.2*  AST 16  ALT 13  ALKPHOS 43  BILITOT 1.0   PT/INR No results for input(s): "LABPROT", "INR" in the last 72 hours.   Scheduled Meds:  aspirin EC  81 mg Oral Daily   atorvastatin  10 mg Oral QHS   enoxaparin (LOVENOX) injection  30 mg Subcutaneous Q24H   linaclotide  145 mcg Oral QAC breakfast   nystatin cream   Topical BID   Continuous Infusions:  lactated ringers 75 mL/hr at 03/08/23 0547     Impression/plan:   Ileus Abdominal pain History of constipation Reassuringly passing gas and having bowel movements although has some mild distention on physical exam today.  Normal active bowel sounds. -Continue to maintain magnesium above 2 and potassium at 4-4.5.  -Encourage early ambulation. -Minimize narcotics and anticholinergic  medications  - get daily portable KUB  daily - Maintain adequate hydration with IVF maintenance - continue bowel regimen  Dilated intra and extrahepatic ducts Gallstones without liver enzyme elevation MRCP showing CBD 1.2 cm with sludge in the ampulla.  Sludge and gravel and contracted gallbladder. - Patient will need ERCP, timing TBD per Dr. Elnoria Howard.  Patient will likely need to improve from her ileus prior to undergoing procedure. - Continue to trend LFTs, they remain normal   Katherine Floyd  03/08/2023, 9:46 AM   Attending physician's note   I have taken a history, reviewed the chart and examined the patient. I performed a substantive portion of this encounter, including complete performance of at least one of the key components, in conjunction with the APP. I agree with the APP's note, impression and recommendations.    77 year old female admitted with worsening abdominal distention, ileus improving, had multiple bowel movements No evidence of bowel obstruction Dilated intra and extrahepatic duct, ?  Sludge in distal CBD, no definite stone.  LFT are normal Patient has mild right upper quadrant tenderness on exam She will benefit from EUS to evaluate distal CBD prior to proceeding with ERCP as there was no definite stone visualized on MRCP and LFT are normal.  Tentatively plan to schedule procedure on Monday with Dr. Elnoria Howard  Continue to advance diet as tolerated  The patient was provided an opportunity to ask questions and all were answered. The patient agreed with the plan and demonstrated an understanding of the instructions.   Iona Beard , MD [phone]

## 2023-03-08 NOTE — H&P (View-Only) (Signed)
Progress Note  Primary GI: Dr. Elnoria Howard  LOS: 0 days   Chief Complaint: Ileus   Subjective   Patient states she feels her belly is more distended than it was yesterday.  She is having adequate bowel movements and passing gas.  She had a bowel movement this morning after being given her Linzess.  Has some mild abdominal tenderness.    Objective   Vital signs in last 24 hours: Temp:  [98 F (36.7 C)-98.5 F (36.9 C)] 98.1 F (36.7 C) (11/09 0750) Pulse Rate:  [75-82] 82 (11/09 0750) Resp:  [16-18] 16 (11/09 0750) BP: (99-122)/(50-68) 122/68 (11/09 0750) SpO2:  [94 %-98 %] 98 % (11/09 0750) Last BM Date : 03/06/23 Last BM recorded by nurses in past 5 days No data recorded  General:   female in no acute distress  Heart:  Regular rate and rhythm; no murmurs Pulm: Clear anteriorly; no wheezing Abdomen: Somewhat distended, generalized tenderness, normoactive bowel sounds. Extremities:  No edema Neurologic:  Alert and  oriented x4;  No focal deficits.  Psych:  Cooperative. Normal mood and affect.  Intake/Output from previous day: 11/08 0701 - 11/09 0700 In: 1131.2 [P.O.:50; I.V.:1081.2] Out: -  Intake/Output this shift: No intake/output data recorded.  Studies/Results: MR ABDOMEN MRCP W WO CONTAST  Result Date: 03/08/2023 CLINICAL DATA:  Cholelithiasis, abdominal pain for 3 days new biliary ductal dilatation by prior CT EXAM: MRI ABDOMEN WITHOUT AND WITH CONTRAST (INCLUDING MRCP) TECHNIQUE: Multiplanar multisequence MR imaging of the abdomen was performed both before and after the administration of intravenous contrast. Heavily T2-weighted images of the biliary and pancreatic ducts were obtained, and three-dimensional MRCP images were rendered by post processing. CONTRAST:  4mL GADAVIST GADOBUTROL 1 MMOL/ML IV SOLN COMPARISON:  CT abdomen pelvis, 03/06/2023 FINDINGS: Examination is generally limited by breath motion artifact. Lower chest: No acute abnormality. Hepatobiliary: No  solid liver abnormality is seen. Sludge and gravel contracted in the gallbladder. Severe intra and extrahepatic biliary ductal dilatation, the common bile duct measuring up to 1.2 cm in caliber. Layering sludge at the ampulla without clearly visualized gallstones (series 3, image 14). Pancreas: Mildly prominent pancreatic duct measuring up to 0.3 cm in caliber although tapering smoothly to the ampulla. No overt pancreatic ductal dilatation or surrounding inflammatory changes. Spleen: Normal in size without significant abnormality. Adrenals/Urinary Tract: Adrenal glands are unremarkable. Kidneys are normal, without renal calculi, solid lesion, or hydronephrosis. Stomach/Bowel: Stomach is within normal limits. The partially included colon is diffusely distended by gas and fluid, better imaged by recent CT. Vascular/Lymphatic: Aortic atherosclerosis. No enlarged abdominal lymph nodes. Other: No abdominal wall hernia. Anasarca. Trace perihepatic ascites. Musculoskeletal: No acute or significant osseous findings. IMPRESSION: 1. Examination limited by breath motion artifact. Within this limitation, severe intra and extrahepatic biliary ductal dilatation, the common bile duct measuring up to 1.2 cm in caliber. Layering sludge at the ampulla without clearly visualized gallstones. Consider ERCP. 2. Sludge and gravel contracted in the gallbladder. 3. Trace ascites and anasarca. 4. The partially included colon is diffusely distended by gas and fluid, better imaged by recent CT. Electronically Signed   By: Jearld Lesch M.D.   On: 03/08/2023 06:44   CT ABDOMEN PELVIS W CONTRAST  Result Date: 03/06/2023 CLINICAL DATA:  Bowel obstruction suspected EXAM: CT ABDOMEN AND PELVIS WITH CONTRAST TECHNIQUE: Multidetector CT imaging of the abdomen and pelvis was performed using the standard protocol following bolus administration of intravenous contrast. RADIATION DOSE REDUCTION: This exam was performed according to the  departmental  dose-optimization program which includes automated exposure control, adjustment of the mA and/or kV according to patient size and/or use of iterative reconstruction technique. CONTRAST:  75mL OMNIPAQUE IOHEXOL 350 MG/ML SOLN COMPARISON:  01/08/2021, 01/08/2023 FINDINGS: Lower chest: No acute pleural or parenchymal lung disease. Hepatobiliary: Calcified gallstones without evidence of acute cholecystitis. Liver parenchyma is unremarkable without focal abnormality. There is intrahepatic and extrahepatic biliary duct dilation, new since prior study, with common bile duct measuring up to 10 mm. No evidence of choledocholithiasis. Pancreas: Unremarkable. No pancreatic ductal dilatation or surrounding inflammatory changes. Spleen: Normal in size without focal abnormality. Adrenals/Urinary Tract: Adrenal glands are unremarkable. Kidneys are normal, without renal calculi, focal lesion, or hydronephrosis. Bladder is unremarkable. Stomach/Bowel: Postsurgical changes from right hemicolectomy are again noted. There is marked gaseous distention of the colon, with a large amount of stool within the rectum. Oval radiopaque foreign bodies are seen within the bowel lumen within the mid jejunum, sigmoid colon, and rectum, likely representing undigested pills. There is prominent distension of the mid jejunum through ileum, with multiple gas fluid levels identified. The stomach, duodenum, and proximal jejunum are fairly decompressed. Maximal diameter of the jejunum measures up to 3.5 cm, with numerous gas fluid levels. No bowel wall thickening or inflammatory change. Vascular/Lymphatic: Aortic atherosclerosis. No enlarged abdominal or pelvic lymph nodes. Reproductive: Status post hysterectomy. No adnexal masses. Other: Trace free fluid within the bilateral paracolic gutters. No free intraperitoneal gas. Postsurgical changes from prior ventral hernia repair. Musculoskeletal: No acute or destructive bony abnormalities. Reconstructed  images demonstrate no additional findings. IMPRESSION: 1. Prominent bowel distension extending from the mid jejunum through the rectum, with numerous small bowel gas fluid levels identified. Without clear transition point, and given the diffuse distension of the entirety of the distal bowel, ileus is favored over distal colonic obstruction. 2. Interval development of intrahepatic and extrahepatic biliary duct dilation, without clear cause for obstruction or evidence of choledocholithiasis. Please correlate with liver function tests. 3. Cholelithiasis without evidence of acute cholecystitis. 4. Trace free fluid within the bilateral paracolic gutters. 5.  Aortic Atherosclerosis (ICD10-I70.0). Electronically Signed   By: Sharlet Salina M.D.   On: 03/06/2023 19:59    Lab Results: Recent Labs    03/06/23 1533 03/07/23 0636  WBC 9.6 7.0  HGB 11.9* 10.0*  HCT 37.1 31.3*  PLT 227 168   BMET Recent Labs    03/06/23 1533 03/07/23 0636  NA 137 139  K 3.9 3.7  CL 102 106  CO2 24 24  GLUCOSE 106* 73  BUN 18 18  CREATININE 1.26* 1.14*  CALCIUM 10.0 8.7*   LFT Recent Labs    03/07/23 0636  PROT 5.4*  ALBUMIN 3.2*  AST 16  ALT 13  ALKPHOS 43  BILITOT 1.0   PT/INR No results for input(s): "LABPROT", "INR" in the last 72 hours.   Scheduled Meds:  aspirin EC  81 mg Oral Daily   atorvastatin  10 mg Oral QHS   enoxaparin (LOVENOX) injection  30 mg Subcutaneous Q24H   linaclotide  145 mcg Oral QAC breakfast   nystatin cream   Topical BID   Continuous Infusions:  lactated ringers 75 mL/hr at 03/08/23 0547     Impression/plan:   Ileus Abdominal pain History of constipation Reassuringly passing gas and having bowel movements although has some mild distention on physical exam today.  Normal active bowel sounds. -Continue to maintain magnesium above 2 and potassium at 4-4.5.  -Encourage early ambulation. -Minimize narcotics and anticholinergic  medications  - get daily portable KUB  daily - Maintain adequate hydration with IVF maintenance - continue bowel regimen  Dilated intra and extrahepatic ducts Gallstones without liver enzyme elevation MRCP showing CBD 1.2 cm with sludge in the ampulla.  Sludge and gravel and contracted gallbladder. - Patient will need ERCP, timing TBD per Dr. Elnoria Howard.  Patient will likely need to improve from her ileus prior to undergoing procedure. - Continue to trend LFTs, they remain normal   Katherine Floyd  03/08/2023, 9:46 AM   Attending physician's note   I have taken a history, reviewed the chart and examined the patient. I performed a substantive portion of this encounter, including complete performance of at least one of the key components, in conjunction with the APP. I agree with the APP's note, impression and recommendations.    77 year old female admitted with worsening abdominal distention, ileus improving, had multiple bowel movements No evidence of bowel obstruction Dilated intra and extrahepatic duct, ?  Sludge in distal CBD, no definite stone.  LFT are normal Patient has mild right upper quadrant tenderness on exam She will benefit from EUS to evaluate distal CBD prior to proceeding with ERCP as there was no definite stone visualized on MRCP and LFT are normal.  Tentatively plan to schedule procedure on Monday with Dr. Elnoria Howard  Continue to advance diet as tolerated  The patient was provided an opportunity to ask questions and all were answered. The patient agreed with the plan and demonstrated an understanding of the instructions.   Katherine Floyd , MD [phone]

## 2023-03-08 NOTE — Progress Notes (Signed)
[facility]  PROGRESS NOTE  Katherine Floyd NUU:[id number] DOB: 04/15/1946 DOA: 03/06/2023 PCP: Richmond Campbell., PA-C   Brief HPI:   77 y.o. female with medical history significant of PAF not on AC, bowel surgery in past.  Pt in to ED with N/V/D, abd pain.  Ongoing for past 3 days.  X ray done at [facility] today.  Pt sent in to ED for concern for SBO.  CT scan in ED = LBO vs ileus.  Also bile duct dilation of unclear significance with nl LFTs.  Chronic constipation.    Assessment/Plan:   Ileus  -Resolved; had BM yesterday -General Surgery has signed off -Gastroenterology following -Linzess started per GI  Biliary ductal dilatation -Seen on CT scan, both intra and extrahepatic ductal dilatation without evidence of choledocholithiasis -LFTs are normal -GI following -MRCP abdominal pain shows 1.2 cm dilated CBD and sludge in the gallbladder -Started on full liquid diet -Plan for endoscopic ultrasound and/or/ERCP on Monday  Paroxysmal atrial fibrillation -Patient is only taking aspirin 81 mg daily -Not on rate control medications  Chronic diastolic heart failure with preserved ejection fraction -Continue to hold Lasix -Will discontinue IV fluids    Medications     aspirin EC  81 mg Oral Daily   atorvastatin  10 mg Oral QHS   enoxaparin (LOVENOX) injection  30 mg Subcutaneous Q24H   linaclotide  145 mcg Oral QAC breakfast   nystatin cream   Topical BID     Data Reviewed:   CBG:  No results for input(s): "GLUCAP" in the last 168 hours.  SpO2: 98 %    Vitals:   03/07/23 1557 03/07/23 2021 03/08/23 0408 03/08/23 0750  BP: (!) 99/59 (!) 119/50 (!) 114/59 122/68  Pulse: 79 78 75 82  Resp: 18 16 18 16   Temp: 98.2 F (36.8 C) 98 F (36.7 C) 98.5 F (36.9 C) 98.1 F (36.7 C)  TempSrc: Oral Oral Oral Oral  SpO2: 98% 94% 96% 98%  Weight:      Height:          Data Reviewed:  Basic Metabolic Panel: Recent Labs  Lab 03/06/23 1533 03/07/23 0636  NA  137 139  K 3.9 3.7  CL 102 106  CO2 24 24  GLUCOSE 106* 73  BUN 18 18  CREATININE 1.26* 1.14*  CALCIUM 10.0 8.7*    CBC: Recent Labs  Lab 03/06/23 1533 03/07/23 0636  WBC 9.6 7.0  NEUTROABS 6.0  --   HGB 11.9* 10.0*  HCT 37.1 31.3*  MCV 93.9 91.8  PLT 227 168    LFT Recent Labs  Lab 03/06/23 1533 03/07/23 0636  AST 20 16  ALT 16 13  ALKPHOS 53 43  BILITOT 1.1 1.0  PROT 6.4* 5.4*  ALBUMIN 3.8 3.2*     Antibiotics: Anti-infectives (From admission, onward)    None        DVT prophylaxis: Lovenox  Code Status: Full code  Family Communication:    CONSULTS General Surgery, gastroenterology   Subjective   Patient stated she passed gas and had BM yesterday   Objective    Physical Examination:  General-appears in no acute distress Heart-S1-S2, regular, no murmur auscultated Lungs-clear to auscultation bilaterally, no wheezing or crackles auscultated Abdomen-soft, nontender, no organomegaly Extremities-no edema in the lower extremities Neuro-alert, oriented x3, no focal deficit noted   Status is: Inpatient:          Meredeth Ide   [facility] If 7PM-7AM, please contact  night-coverage at [email or web address], Office  [phone]   03/08/2023, 1:39 PM  LOS: 0 days

## 2023-03-08 NOTE — Care Management Obs Status (Signed)
MEDICARE OBSERVATION STATUS NOTIFICATION   Patient Details  Name: Katherine Floyd MRN: [id number] Date of Birth: May 07, 1945   Medicare Observation Status Notification Given:  Yes    Ronny Bacon, RN 03/08/2023, 7:30 AM

## 2023-03-09 DIAGNOSIS — R935 Abnormal findings on diagnostic imaging of other abdominal regions, including retroperitoneum: Secondary | ICD-10-CM | POA: Diagnosis not present

## 2023-03-09 DIAGNOSIS — K838 Other specified diseases of biliary tract: Secondary | ICD-10-CM | POA: Diagnosis not present

## 2023-03-09 DIAGNOSIS — K567 Ileus, unspecified: Secondary | ICD-10-CM | POA: Diagnosis not present

## 2023-03-09 LAB — COMPREHENSIVE METABOLIC PANEL
ALT: 16 U/L (ref 0–44)
AST: 22 U/L (ref 15–41)
Albumin: 3 g/dL — ABNORMAL LOW (ref 3.5–5.0)
Alkaline Phosphatase: 42 U/L (ref 38–126)
Anion gap: 11 (ref 5–15)
BUN: 14 mg/dL (ref 8–23)
CO2: 26 mmol/L (ref 22–32)
Calcium: 8.8 mg/dL — ABNORMAL LOW (ref 8.9–10.3)
Chloride: 101 mmol/L (ref 98–111)
Creatinine, Ser: 1.12 mg/dL — ABNORMAL HIGH (ref 0.44–1.00)
GFR, Estimated: 50 mL/min — ABNORMAL LOW (ref >60)
Glucose, Bld: 84 mg/dL (ref 70–99)
Potassium: 3.6 mmol/L (ref 3.5–5.1)
Sodium: 138 mmol/L (ref 135–145)
Total Bilirubin: 1.1 mg/dL (ref <1.2)
Total Protein: 5.2 g/dL — ABNORMAL LOW (ref 6.5–8.1)

## 2023-03-09 LAB — CBC
HCT: 29.8 % — ABNORMAL LOW (ref 36.0–46.0)
Hemoglobin: 9.8 g/dL — ABNORMAL LOW (ref 12.0–15.0)
MCH: 30 pg (ref 26.0–34.0)
MCHC: 32.9 g/dL (ref 30.0–36.0)
MCV: 91.1 fL (ref 80.0–100.0)
Platelets: 155 10*3/uL (ref 150–400)
RBC: 3.27 MIL/uL — ABNORMAL LOW (ref 3.87–5.11)
RDW: 13.2 % (ref 11.5–15.5)
WBC: 5.6 10*3/uL (ref 4.0–10.5)
nRBC: 0 % (ref 0.0–0.2)

## 2023-03-09 NOTE — Progress Notes (Signed)
[facility]  PROGRESS NOTE  Katherine Floyd ZOX:[id number] DOB: May 19, 1945 DOA: 03/06/2023 PCP: Richmond Campbell., PA-C   Brief HPI:   77 y.o. female with medical history significant of PAF not on AC, bowel surgery in past.  Pt in to ED with N/V/D, abd pain.  Ongoing for past 3 days.  X ray done at [facility] today.  Pt sent in to ED for concern for SBO.  CT scan in ED = LBO vs ileus.  Also bile duct dilation of unclear significance with nl LFTs.  Chronic constipation.    Assessment/Plan:   Ileus  -Resolved; had BM yesterday -General Surgery has signed off -Gastroenterology following -Linzess started per GI  Biliary ductal dilatation -Seen on CT scan, both intra and extrahepatic ductal dilatation without evidence of choledocholithiasis -LFTs are normal -GI following -MRCP abdominal pain shows 1.2 cm dilated CBD and sludge in the gallbladder -Started on full liquid diet -Plan for endoscopic ultrasound and/or/ERCP on Monday  Paroxysmal atrial fibrillation -Patient is only taking aspirin 81 mg daily -Not on rate control medications  Chronic diastolic heart failure with preserved ejection fraction -Continue to hold Lasix -Will discontinue IV fluids    Medications     aspirin EC  81 mg Oral Daily   atorvastatin  10 mg Oral QHS   enoxaparin (LOVENOX) injection  30 mg Subcutaneous Q24H   linaclotide  145 mcg Oral QAC breakfast   nystatin cream   Topical BID     Data Reviewed:   CBG:  No results for input(s): "GLUCAP" in the last 168 hours.  SpO2: 100 %    Vitals:   03/08/23 1435 03/08/23 1932 03/09/23 0419 03/09/23 0742  BP: (!) 120/57 131/73 120/61 131/66  Pulse: 70 66 68 65  Resp: 16 17 19 17   Temp: 98.1 F (36.7 C) 98.6 F (37 C) 98.4 F (36.9 C) 98.1 F (36.7 C)  TempSrc: Oral Oral Oral Oral  SpO2: 99% 99% 98% 100%  Weight:      Height:          Data Reviewed:  Basic Metabolic Panel: Recent Labs  Lab 03/06/23 1533 03/07/23 0636  03/09/23 0608  NA 137 139 138  K 3.9 3.7 3.6  CL 102 106 101  CO2 24 24 26   GLUCOSE 106* 73 84  BUN 18 18 14   CREATININE 1.26* 1.14* 1.12*  CALCIUM 10.0 8.7* 8.8*    CBC: Recent Labs  Lab 03/06/23 1533 03/07/23 0636 03/09/23 0608  WBC 9.6 7.0 5.6  NEUTROABS 6.0  --   --   HGB 11.9* 10.0* 9.8*  HCT 37.1 31.3* 29.8*  MCV 93.9 91.8 91.1  PLT 227 168 155    LFT Recent Labs  Lab 03/06/23 1533 03/07/23 0636 03/09/23 0608  AST 20 16 22   ALT 16 13 16   ALKPHOS 53 43 42  BILITOT 1.1 1.0 1.1  PROT 6.4* 5.4* 5.2*  ALBUMIN 3.8 3.2* 3.0*     Antibiotics: Anti-infectives (From admission, onward)    None        DVT prophylaxis: Lovenox  Code Status: Full code  Family Communication:    CONSULTS General Surgery, gastroenterology   Subjective   Denies abdominal pain, had BM yesterday   Objective    Physical Examination:  General-appears in no acute distress Heart-S1-S2, regular, no murmur auscultated Lungs-clear to auscultation bilaterally, no wheezing or crackles auscultated Abdomen-soft, mild tenderness to palpation in right upper quadrant  Extremities-no edema in the lower extremities Neuro-alert, oriented x3,  no focal deficit noted   Status is: Inpatient:          Meredeth Ide   [facility] If 7PM-7AM, please contact night-coverage at [email or web address], Office  [phone]   03/09/2023, 10:17 AM  LOS: 0 days

## 2023-03-10 ENCOUNTER — Observation Stay (HOSPITAL_BASED_OUTPATIENT_CLINIC_OR_DEPARTMENT_OTHER): Payer: Medicare HMO | Admitting: Registered Nurse

## 2023-03-10 ENCOUNTER — Observation Stay (HOSPITAL_COMMUNITY): Payer: Medicare HMO | Admitting: Registered Nurse

## 2023-03-10 ENCOUNTER — Observation Stay (HOSPITAL_COMMUNITY): Payer: Medicare HMO

## 2023-03-10 ENCOUNTER — Encounter (HOSPITAL_COMMUNITY): Payer: Self-pay | Admitting: Internal Medicine

## 2023-03-10 ENCOUNTER — Encounter (HOSPITAL_COMMUNITY)
Admission: EM | Disposition: A | Discharge status: Home / Self Care | Payer: Self-pay | Source: Home / Self Care | Attending: Emergency Medicine

## 2023-03-10 DIAGNOSIS — K8020 Calculus of gallbladder without cholecystitis without obstruction: Secondary | ICD-10-CM | POA: Diagnosis not present

## 2023-03-10 DIAGNOSIS — I4891 Unspecified atrial fibrillation: Secondary | ICD-10-CM

## 2023-03-10 DIAGNOSIS — K567 Ileus, unspecified: Secondary | ICD-10-CM | POA: Diagnosis not present

## 2023-03-10 DIAGNOSIS — R935 Abnormal findings on diagnostic imaging of other abdominal regions, including retroperitoneum: Secondary | ICD-10-CM | POA: Diagnosis not present

## 2023-03-10 DIAGNOSIS — K838 Other specified diseases of biliary tract: Secondary | ICD-10-CM | POA: Diagnosis not present

## 2023-03-10 HISTORY — PX: ESOPHAGOGASTRODUODENOSCOPY (EGD) WITH PROPOFOL: SHX5813

## 2023-03-10 HISTORY — PX: UPPER ESOPHAGEAL ENDOSCOPIC ULTRASOUND (EUS): SHX6562

## 2023-03-10 SURGERY — UPPER ESOPHAGEAL ENDOSCOPIC ULTRASOUND (EUS)
Anesthesia: General

## 2023-03-10 MED ORDER — FENTANYL CITRATE (PF) 250 MCG/5ML IJ SOLN
INTRAMUSCULAR | Status: DC | PRN
  Administered 2023-03-10 (×2): 50 ug via INTRAVENOUS

## 2023-03-10 MED ORDER — SUGAMMADEX SODIUM 200 MG/2ML IV SOLN
INTRAVENOUS | Status: DC | PRN
  Administered 2023-03-10: 200 mg via INTRAVENOUS

## 2023-03-10 MED ORDER — OXYCODONE HCL 5 MG/5ML PO SOLN: 5.0000 mg | Freq: Once | ORAL | Status: DC | PRN

## 2023-03-10 MED ORDER — DICLOFENAC SUPPOSITORY 100 MG
RECTAL | Status: DC | PRN
  Administered 2023-03-10: 100 mg via RECTAL

## 2023-03-10 MED ORDER — LIDOCAINE 2% (20 MG/ML) 5 ML SYRINGE
INTRAMUSCULAR | Status: DC | PRN
  Administered 2023-03-10: 40 mg via INTRAVENOUS

## 2023-03-10 MED ORDER — INDOMETHACIN 50 MG RE SUPP: 100.0000 mg | Freq: Once | RECTAL | Status: DC

## 2023-03-10 MED ORDER — FENTANYL CITRATE (PF) 100 MCG/2ML IJ SOLN: 25.0000 ug | INTRAMUSCULAR | Status: DC | PRN

## 2023-03-10 MED ORDER — GLUCAGON HCL RDNA (DIAGNOSTIC) 1 MG IJ SOLR
INTRAMUSCULAR | Status: AC
Start: 2023-03-10 — End: ?
  Filled 2023-03-10: qty 1

## 2023-03-10 MED ORDER — DROPERIDOL 2.5 MG/ML IJ SOLN: 0.6250 mg | Freq: Once | INTRAMUSCULAR | Status: DC | PRN

## 2023-03-10 MED ORDER — PROPOFOL 10 MG/ML IV BOLUS
INTRAVENOUS | Status: DC | PRN
  Administered 2023-03-10: 100 mg via INTRAVENOUS

## 2023-03-10 MED ORDER — DICLOFENAC SUPPOSITORY 100 MG
RECTAL | Status: AC
  Filled 2023-03-10: qty 1

## 2023-03-10 MED ORDER — CIPROFLOXACIN IN D5W 400 MG/200ML IV SOLN
INTRAVENOUS | Status: AC
  Filled 2023-03-10: qty 200

## 2023-03-10 MED ORDER — SODIUM CHLORIDE 0.9 % IV SOLN: INTRAVENOUS | Status: DC | PRN

## 2023-03-10 MED ORDER — FENTANYL CITRATE (PF) 100 MCG/2ML IJ SOLN
INTRAMUSCULAR | Status: AC
  Filled 2023-03-10: qty 2

## 2023-03-10 MED ORDER — CIPROFLOXACIN IN D5W 400 MG/200ML IV SOLN: 400.0000 mg | Freq: Once | INTRAVENOUS | Status: DC

## 2023-03-10 MED ORDER — SODIUM CHLORIDE 0.9 % IV SOLN: INTRAVENOUS | Status: DC

## 2023-03-10 MED ORDER — ROCURONIUM BROMIDE 10 MG/ML (PF) SYRINGE
PREFILLED_SYRINGE | INTRAVENOUS | Status: DC | PRN
  Administered 2023-03-10: 40 mg via INTRAVENOUS

## 2023-03-10 MED ORDER — OXYCODONE HCL 5 MG PO TABS: 5.0000 mg | ORAL_TABLET | Freq: Once | ORAL | Status: DC | PRN

## 2023-03-10 NOTE — Anesthesia Procedure Notes (Signed)
Procedure Name: Intubation Date/Time: 03/10/2023 11:45 AM  Performed by: Sharyn Dross, CRNAPre-anesthesia Checklist: Patient identified, Emergency Drugs available, Suction available and Patient being monitored Patient Re-evaluated:Patient Re-evaluated prior to induction Oxygen Delivery Method: Circle system utilized Preoxygenation: Pre-oxygenation with 100% oxygen Induction Type: IV induction Ventilation: Mask ventilation without difficulty Laryngoscope Size: Mac and 4 Grade View: Grade II Tube type: Oral Tube size: 7.0 mm Number of attempts: 1 Airway Equipment and Method: Stylet and Oral airway Placement Confirmation: ETT inserted through vocal cords under direct vision, positive ETCO2 and breath sounds checked- equal and bilateral Secured at: 22 cm Tube secured with: Tape Dental Injury: Teeth and Oropharynx as per pre-operative assessment

## 2023-03-10 NOTE — Anesthesia Postprocedure Evaluation (Signed)
Anesthesia Post Note  Patient: Katherine Floyd  Procedure(s) Performed: UPPER ESOPHAGEAL ENDOSCOPIC ULTRASOUND (EUS) ESOPHAGOGASTRODUODENOSCOPY (EGD) WITH PROPOFOL     Patient location during evaluation: PACU Anesthesia Type: General Level of consciousness: awake and alert Pain management: pain level controlled Vital Signs Assessment: post-procedure vital signs reviewed and stable Respiratory status: spontaneous breathing, nonlabored ventilation, respiratory function stable and patient connected to nasal cannula oxygen Cardiovascular status: blood pressure returned to baseline and stable Postop Assessment: no apparent nausea or vomiting Anesthetic complications: no   There were no known notable events for this encounter.  Last Vitals:  Vitals:   03/10/23 1247 03/10/23 1305  BP:  (!) 155/71  Pulse: 64 65  Resp: 18 18  Temp:  36.5 C  SpO2: 99% 100%    Last Pain:  Vitals:   03/10/23 1305  TempSrc: Oral  PainSc:                  [place]

## 2023-03-10 NOTE — Transfer of Care (Signed)
Immediate Anesthesia Transfer of Care Note  Patient: Katherine Floyd  Procedure(s) Performed: UPPER ESOPHAGEAL ENDOSCOPIC ULTRASOUND (EUS) ESOPHAGOGASTRODUODENOSCOPY (EGD) WITH PROPOFOL  Patient Location: PACU  Anesthesia Type:General  Level of Consciousness: awake, alert , and oriented  Airway & Oxygen Therapy: Patient Spontanous Breathing  Post-op Assessment: Report given to RN and Post -op Vital signs reviewed and stable  Post vital signs: Reviewed and stable  Last Vitals:  Vitals Value Taken Time  BP 134/75   Temp    Pulse 70   Resp 12   SpO2 99     Last Pain:  Vitals:   03/10/23 1057  TempSrc: Temporal  PainSc: 0-No pain      Patients Stated Pain Goal: 0 (03/06/23 2030)  Complications: There were no known notable events for this encounter.

## 2023-03-10 NOTE — Interval H&P Note (Signed)
History and Physical Interval Note:  03/10/2023 11:34 AM  Katherine Floyd  has presented today for surgery, with the diagnosis of dilated common bile duct.  The various methods of treatment have been discussed with the patient and family. After consideration of risks, benefits and other options for treatment, the patient has consented to  Procedure(s): ENDOSCOPIC RETROGRADE CHOLANGIOPANCREATOGRAPHY (ERCP) (N/A) as a surgical intervention.  The patient's history has been reviewed, patient examined, no change in status, stable for surgery.  I have reviewed the patient's chart and labs.  Questions were answered to the patient's satisfaction.     Rakeen Gaillard D

## 2023-03-10 NOTE — Progress Notes (Addendum)
[facility]  PROGRESS NOTE  TIWANDA CLAVERIA YQM:[id number] DOB: December 15, 1945 DOA: 03/06/2023 PCP: Richmond Campbell., PA-C   Brief HPI:   77 y.o. female with medical history significant of PAF not on AC, bowel surgery in past.  Pt in to ED with N/V/D, abd pain.  Ongoing for past 3 days.  X ray done at [facility] today.  Pt sent in to ED for concern for SBO.  CT scan in ED = LBO vs ileus.  Also bile duct dilation of unclear significance with nl LFTs.  Chronic constipation.    Assessment/Plan:   Ileus  -Resolved; had BM yesterday -General Surgery has signed off -Gastroenterology following -Linzess started per GI  Biliary ductal dilatation -Seen on CT scan, both intra and extrahepatic ductal dilatation without evidence of choledocholithiasis -LFTs are normal -GI following -MRCP abdominal pain shows 1.2 cm dilated CBD and sludge in the gallbladder -Started on full liquid diet yesterday, currently n.p.o. -Plan for endoscopic ultrasound today .  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  SpO2: 100 %    Vitals:   03/10/23 1230 03/10/23 1240 03/10/23 1247 03/10/23 1305  BP: 136/62 133/60  (!) 155/71  Pulse: 62 61 64 65  Resp: 13 16 18 18   Temp: (!) 97.2 F (36.2 C)   97.7 F (36.5 C)  TempSrc: Temporal   Oral  SpO2: 100% 100% 99% 100%  Weight:      Height:          Data Reviewed:  Basic Metabolic Panel: Recent Labs  Lab 03/06/23 1533  03/07/23 0636 03/09/23 0608  NA 137 139 138  K 3.9 3.7 3.6  CL 102 106 101  CO2 24 24 26   GLUCOSE 106* 73 84  BUN 18 18 14   CREATININE 1.26* 1.14* 1.12*  CALCIUM 10.0 8.7* 8.8*    CBC: Recent Labs  Lab 03/06/23 1533 03/07/23 0636 03/09/23 0608  WBC 9.6 7.0 5.6  NEUTROABS 6.0  --   --   HGB 11.9* 10.0* 9.8*  HCT 37.1 31.3* 29.8*  MCV 93.9 91.8 91.1  PLT 227 168 155    LFT Recent Labs  Lab 03/06/23 1533 03/07/23 0636 03/09/23 0608  AST 20 16 22   ALT 16 13 16   ALKPHOS 53 43 42  BILITOT 1.1 1.0 1.1  PROT 6.4* 5.4* 5.2*  ALBUMIN 3.8 3.2* 3.0*     Antibiotics: Anti-infectives (From admission, onward)    Start     Dose/Rate Route Frequency Ordered Stop   03/10/23 1100  ciprofloxacin (CIPRO) IVPB 400 mg  Status:  Discontinued        400 mg 200 mL/hr over 60 Minutes Intravenous  Once 03/10/23 1059 03/10/23 1255        DVT prophylaxis: Lovenox  Code Status: Full code  Family Communication:    CONSULTS General Surgery, gastroenterology   Subjective   He denies abdominal pain.  Had BM yesterday.  Plan for endoscopic ultrasound today   Objective    Physical Examination:  General-appears in no acute distress Heart-S1-S2, regular, no murmur auscultated Lungs-clear to auscultation bilaterally, no wheezing or crackles auscultated Abdomen-soft, nontender, no organomegaly Extremities-no edema in the lower extremities Neuro-alert, oriented x3, no focal deficit noted   Status is: Inpatient:          Meredeth Ide   [facility] If 7PM-7AM, please contact night-coverage at [email or web address], Office  [phone]   03/10/2023, 2:37 PM  LOS: 0 days

## 2023-03-10 NOTE — Progress Notes (Signed)
Mobility Specialist Progress Note:   03/10/23 0915  Mobility  Activity Ambulated with assistance in hallway  Level of Assistance Standby assist, set-up cues, supervision of patient - no hands on  Assistive Device None  Distance Ambulated (ft) 550 ft  Activity Response Tolerated well  Mobility Referral Yes  $Mobility charge 1 Mobility  Mobility Specialist Start Time (ACUTE ONLY) 0915  Mobility Specialist Stop Time (ACUTE ONLY) 0930  Mobility Specialist Time Calculation (min) (ACUTE ONLY) 15 min   Pt eager for mobility session. Required no physical assistance throughout ambulation. No c/o throughout. Back in chair with all needs met.   Addison Lank Mobility Specialist Please contact via SecureChat or  Rehab office at [phone]

## 2023-03-10 NOTE — Op Note (Signed)
[facility] Patient Name: Katherine Floyd Procedure Date : 03/10/2023 MRN: [id number] Attending MD: Jeani Hawking , MD, [id number] Date of Birth: 09/02/1945 CSN: [id number] Age: 77 Admit Type: Inpatient Procedure:                Upper EUS Indications:              Common bile duct dilation (etiology unknown) seen                            on CT scan Providers:                Jeani Hawking, MD, Eliberto Ivory, RN, Kandice Robinsons,                            Technician Referring MD:              Medicines:                General Anesthesia Complications:            No immediate complications. Estimated Blood Loss:     Estimated blood loss: none. Procedure:                Pre-Anesthesia Assessment:                           - Prior to the procedure, a History and Physical                            was performed, and patient medications and                            allergies were reviewed. The patient's tolerance of                            previous anesthesia was also reviewed. The risks                            and benefits of the procedure and the sedation                            options and risks were discussed with the patient.                            All questions were answered, and informed consent                            was obtained. Prior Anticoagulants: The patient has                            taken no anticoagulant or antiplatelet agents. ASA                            Grade Assessment: III - A patient with severe  systemic disease. After reviewing the risks and                            benefits, the patient was deemed in satisfactory                            condition to undergo the procedure.                           - Sedation was administered by an anesthesia                            professional. General anesthesia was attained.                           After obtaining informed consent, the endoscope was                             passed under direct vision. Throughout the                            procedure, the patient's blood pressure, pulse, and                            oxygen saturations were monitored continuously. The                            [id number] ([id number] Olympus linear ultrasound scope                            was introduced through the mouth, and advanced to                            the second part of duodenum. The upper EUS was                            accomplished without difficulty. The patient                            tolerated the procedure well. Scope In: Scope Out: Findings:      ENDOSONOGRAPHIC FINDING: :      There was dilation in the common bile duct which measured up to 10 mm.      One stone was visualized endosonographically in the gallbladder. The       stone measured 5 mm in greatest dimension. The stone was oval. It was       hyperechoic and characterized by shadowing.      There was no sign of significant endosonographic abnormality in the       entire pancreas. The pancreatic duct measured up to 2 mm in diameter.       The pancreatic duct was well visualized from ampulla to tail, the       pancreatic duct was thin in caliber, the pancreatic duct was regular in       contour.      The CBD was noted to  be diffusely dilated at approximately 1 cm. There       was no evidence of any stones or sludge in the CBD. A very limited view       of a small portion of the gallbladder was identified and there was a 5       mm stone. The small bowel dilation obscured the view of the gallbladder.       There was no evidence of any masses in the pancreatic head and the PD       was normal in caliber. Impression:               - There was dilation in the common bile duct which                            measured up to 10 mm.                           - One stone was visualized endosonographically in                            the gallbladder.                            - There was no sign of significant pathology in the                            entire pancreas.                           - No specimens collected. Recommendation:           - Return patient to hospital ward for ongoing care.                           - Resume previous diet. Procedure Code(s):        --- Professional ---                           [phone], Esophagogastroduodenoscopy, flexible,                            transoral; with endoscopic ultrasound examination                            limited to the esophagus, stomach or duodenum, and                            adjacent structures Diagnosis Code(s):        --- Professional ---                           K80.20, Calculus of gallbladder without                            cholecystitis without obstruction                           K83.8, Other  specified diseases of biliary tract                           R93.2, Abnormal findings on diagnostic imaging of                            liver and biliary tract CPT copyright 2022 American Medical Association. All rights reserved. The codes documented in this report are preliminary and upon coder review may  be revised to meet current compliance requirements. Jeani Hawking, MD Jeani Hawking, MD 03/10/2023 12:32:28 PM This report has been signed electronically. Number of Addenda: 0

## 2023-03-10 NOTE — Anesthesia Preprocedure Evaluation (Signed)
Anesthesia Evaluation  Patient identified by MRN, date of birth, ID band Patient awake    Reviewed: Allergy & Precautions, NPO status , Patient's Chart, lab work & pertinent test results  History of Anesthesia Complications Negative for: history of anesthetic complications  Airway Mallampati: II  TM Distance: >3 FB Neck ROM: Full    Dental  (+) Teeth Intact, Dental Advisory Given   Pulmonary asthma , former smoker   Pulmonary exam normal breath sounds clear to auscultation       Cardiovascular hypertension, Pt. on medications Normal cardiovascular exam+ dysrhythmias Atrial Fibrillation  Rhythm:Regular Rate:Normal     Neuro/Psych  PSYCHIATRIC DISORDERS Anxiety     negative neurological ROS     GI/Hepatic Neg liver ROS,GERD  Medicated and Controlled,,  Endo/Other  negative endocrine ROS    Renal/GU negative Renal ROS     Musculoskeletal  (+) Arthritis , Osteoarthritis,    Abdominal   Peds  Hematology negative hematology ROS (+)   Anesthesia Other Findings   Reproductive/Obstetrics                              Anesthesia Physical Anesthesia Plan  ASA: 3  Anesthesia Plan: General   Post-op Pain Management:    Induction: Intravenous  PONV Risk Score and Plan: 3 and Dexamethasone, Ondansetron and Treatment may vary due to age or medical condition  Airway Management Planned: Oral ETT  Additional Equipment:   Intra-op Plan:   Post-operative Plan: Extubation in OR  Informed Consent: I have reviewed the patients History and Physical, chart, labs and discussed the procedure including the risks, benefits and alternatives for the proposed anesthesia with the patient or authorized representative who has indicated his/her understanding and acceptance.     Dental advisory given  Plan Discussed with: CRNA  Anesthesia Plan Comments: (77 y.o. female with medical history significant of  PAF not on AC, bowel surgery in past.  Pt in to ED with N/V/D, abd pain.  Ongoing for past 3 days.  CT scan in ED = LBO vs ileus.  Also bile duct dilation of unclear significance with nl LFTs.  Chronic constipation. )         Anesthesia Quick Evaluation

## 2023-03-11 DIAGNOSIS — K560 Paralytic ileus: Secondary | ICD-10-CM | POA: Diagnosis not present

## 2023-03-11 DIAGNOSIS — K839 Disease of biliary tract, unspecified: Secondary | ICD-10-CM | POA: Diagnosis not present

## 2023-03-11 DIAGNOSIS — I5032 Chronic diastolic (congestive) heart failure: Secondary | ICD-10-CM | POA: Diagnosis not present

## 2023-03-11 DIAGNOSIS — I480 Paroxysmal atrial fibrillation: Secondary | ICD-10-CM | POA: Diagnosis not present

## 2023-03-11 NOTE — Discharge Summary (Signed)
Physician Discharge Summary   Patient: Katherine Floyd MRN: [id number] DOB: 05/24/1945  Admit date:     03/06/2023  Discharge date: 03/11/23  Discharge Physician: Meredeth Ide   PCP: Richmond Campbell., PA-C   Recommendations at discharge:   Follow-up Dr. Elnoria Howard as outpatient Follow-up PCP in 2 weeks  Discharge Diagnoses: Principal Problem:   Bowel obstruction (HCC) Active Problems:   Bile duct abnormality   Chronic heart failure with preserved ejection fraction (HFpEF) (HCC)   Paroxysmal atrial fibrillation (HCC)  Resolved Problems:   * No resolved hospital problems. *  Hospital Course:  77 y.o. female with medical history significant of PAF not on AC, bowel surgery in past.  Pt in to ED with N/V/D, abd pain.  Ongoing for past 3 days.  X ray done at [facility] today.  Pt sent in to ED for concern for SBO.  CT scan in ED = LBO vs ileus.  Also bile duct dilation of unclear significance with nl LFTs.  Chronic constipation.  Assessment and Plan:   Ileus  -Resolved; had multiple BMs over past 2 days  -General Surgery has signed off -Gastroenterology following -Linzess started per GI   Biliary ductal dilatation -Seen on CT scan, both intra and extrahepatic ductal dilatation without evidence of choledocholithiasis -LFTs are normal -GI following -MRCP abdominal pain shows 1.2 cm dilated CBD and sludge in the gallbladder -Started on full liquid diet yesterday, currently n.p.o. -Underwent endoscopic ultrasound yesterday, which did not show any CBD stone; small stone seen in the gallbladder -Discussed with Dr. Elnoria Howard, patient is stable for discharge -He will follow patient as outpatient .   Paroxysmal atrial fibrillation -Patient is only taking aspirin 81 mg daily -Not on rate control medications   Chronic diastolic heart failure with preserved ejection fraction -Continue Lasix         Consultants:  Procedures performed:  Disposition: Home Diet recommendation:   Discharge Diet Orders (From admission, onward)     Start     Ordered   03/11/23 0000  Diet - low sodium heart healthy        03/11/23 1355           Regular diet DISCHARGE MEDICATION: Allergies as of 03/11/2023       Reactions   Elemental Sulfur Itching   Linaclotide    Other reaction(s): Other (See Comments) Diarrhea    Prednisone Other (See Comments)   Patient states that 20 mgs of prednisone feel loopy.   Rofecoxib Other (See Comments)   Unknown   Ampicillin Itching, Rash, Other (See Comments)   Unknown   Dicyclomine Rash   Doxycycline Rash        Medication List     TAKE these medications    acetaminophen 500 MG tablet Commonly known as: TYLENOL Take 1 mg by mouth as needed.   albuterol (2.5 MG/3ML) 0.083% nebulizer solution Commonly known as: PROVENTIL Take 2.5 mg by nebulization every 6 (six) hours as needed for wheezing or shortness of breath.   ALPRAZolam 0.5 MG tablet Commonly known as: XANAX Take 0.25 mg by mouth 2 (two) times daily as needed for anxiety. Anxiety   aspirin EC 81 MG tablet Take 1 tablet (81 mg total) by mouth daily.   atorvastatin 10 MG tablet Commonly known as: LIPITOR Take 1 tablet (10 mg total) by mouth at bedtime.   budesonide-formoterol 160-4.5 MCG/ACT inhaler Commonly known as: SYMBICORT Inhale 2 puffs into the lungs 2 (two) times daily.   CALCIUM-CARB  600 + D PO Take 2 tablets by mouth daily.   carboxymethylcellulose 0.5 % Soln Commonly known as: REFRESH PLUS Place 1 drop into both eyes 2 (two) times daily as needed. Dry eyes   cetirizine 10 MG tablet Commonly known as: ZYRTEC Take 10 mg by mouth daily.   fluticasone 50 MCG/ACT nasal spray Commonly known as: FLONASE Place 2 sprays into both nostrils 2 (two) times daily as needed for allergies.   furosemide 40 MG tablet Commonly known as: LASIX Take 1 tablet (40 mg total) by mouth daily.   Linzess 145 MCG Caps capsule Generic drug: linaclotide Take 145  mcg by mouth every morning.   Lutein 6 MG Caps Take 6 mg by mouth daily.   montelukast 10 MG tablet Commonly known as: SINGULAIR Take 10 mg by mouth at bedtime.   omeprazole 20 MG capsule Commonly known as: PRILOSEC Take 20 mg by mouth daily.   potassium chloride SA 20 MEQ tablet Commonly known as: KLOR-CON M Take 1 tablet (20 mEq total) by mouth daily.   Vitamin B-Complex Tabs Take 1 tablet by mouth daily.   vitamin E 180 MG (400 UNITS) capsule Take 400 Units by mouth daily.        Discharge Exam: Filed Weights   03/06/23 1525  Weight: 49.4 kg   General-appears in no acute distress Heart-S1-S2, regular, no murmur auscultated Lungs-clear to auscultation bilaterally, no wheezing or crackles auscultated Abdomen-soft, nontender, no organomegaly Extremities-no edema in the lower extremities Neuro-alert, oriented x3, no focal deficit noted  Condition at discharge: good  The results of significant diagnostics from this hospitalization (including imaging, microbiology, ancillary and laboratory) are listed below for reference.   Imaging Studies: MR ABDOMEN MRCP W WO CONTAST  Result Date: 03/08/2023 CLINICAL DATA:  Cholelithiasis, abdominal pain for 3 days new biliary ductal dilatation by prior CT EXAM: MRI ABDOMEN WITHOUT AND WITH CONTRAST (INCLUDING MRCP) TECHNIQUE: Multiplanar multisequence MR imaging of the abdomen was performed both before and after the administration of intravenous contrast. Heavily T2-weighted images of the biliary and pancreatic ducts were obtained, and three-dimensional MRCP images were rendered by post processing. CONTRAST:  4mL GADAVIST GADOBUTROL 1 MMOL/ML IV SOLN COMPARISON:  CT abdomen pelvis, 03/06/2023 FINDINGS: Examination is generally limited by breath motion artifact. Lower chest: No acute abnormality. Hepatobiliary: No solid liver abnormality is seen. Sludge and gravel contracted in the gallbladder. Severe intra and extrahepatic biliary ductal  dilatation, the common bile duct measuring up to 1.2 cm in caliber. Layering sludge at the ampulla without clearly visualized gallstones (series 3, image 14). Pancreas: Mildly prominent pancreatic duct measuring up to 0.3 cm in caliber although tapering smoothly to the ampulla. No overt pancreatic ductal dilatation or surrounding inflammatory changes. Spleen: Normal in size without significant abnormality. Adrenals/Urinary Tract: Adrenal glands are unremarkable. Kidneys are normal, without renal calculi, solid lesion, or hydronephrosis. Stomach/Bowel: Stomach is within normal limits. The partially included colon is diffusely distended by gas and fluid, better imaged by recent CT. Vascular/Lymphatic: Aortic atherosclerosis. No enlarged abdominal lymph nodes. Other: No abdominal wall hernia. Anasarca. Trace perihepatic ascites. Musculoskeletal: No acute or significant osseous findings. IMPRESSION: 1. Examination limited by breath motion artifact. Within this limitation, severe intra and extrahepatic biliary ductal dilatation, the common bile duct measuring up to 1.2 cm in caliber. Layering sludge at the ampulla without clearly visualized gallstones. Consider ERCP. 2. Sludge and gravel contracted in the gallbladder. 3. Trace ascites and anasarca. 4. The partially included colon is diffusely distended by gas  and fluid, better imaged by recent CT. Electronically Signed   By: Jearld Lesch M.D.   On: 03/08/2023 06:44   CT ABDOMEN PELVIS W CONTRAST  Result Date: 03/06/2023 CLINICAL DATA:  Bowel obstruction suspected EXAM: CT ABDOMEN AND PELVIS WITH CONTRAST TECHNIQUE: Multidetector CT imaging of the abdomen and pelvis was performed using the standard protocol following bolus administration of intravenous contrast. RADIATION DOSE REDUCTION: This exam was performed according to the departmental dose-optimization program which includes automated exposure control, adjustment of the mA and/or kV according to patient size  and/or use of iterative reconstruction technique. CONTRAST:  75mL OMNIPAQUE IOHEXOL 350 MG/ML SOLN COMPARISON:  01/08/2021, 01/08/2023 FINDINGS: Lower chest: No acute pleural or parenchymal lung disease. Hepatobiliary: Calcified gallstones without evidence of acute cholecystitis. Liver parenchyma is unremarkable without focal abnormality. There is intrahepatic and extrahepatic biliary duct dilation, new since prior study, with common bile duct measuring up to 10 mm. No evidence of choledocholithiasis. Pancreas: Unremarkable. No pancreatic ductal dilatation or surrounding inflammatory changes. Spleen: Normal in size without focal abnormality. Adrenals/Urinary Tract: Adrenal glands are unremarkable. Kidneys are normal, without renal calculi, focal lesion, or hydronephrosis. Bladder is unremarkable. Stomach/Bowel: Postsurgical changes from right hemicolectomy are again noted. There is marked gaseous distention of the colon, with a large amount of stool within the rectum. Oval radiopaque foreign bodies are seen within the bowel lumen within the mid jejunum, sigmoid colon, and rectum, likely representing undigested pills. There is prominent distension of the mid jejunum through ileum, with multiple gas fluid levels identified. The stomach, duodenum, and proximal jejunum are fairly decompressed. Maximal diameter of the jejunum measures up to 3.5 cm, with numerous gas fluid levels. No bowel wall thickening or inflammatory change. Vascular/Lymphatic: Aortic atherosclerosis. No enlarged abdominal or pelvic lymph nodes. Reproductive: Status post hysterectomy. No adnexal masses. Other: Trace free fluid within the bilateral paracolic gutters. No free intraperitoneal gas. Postsurgical changes from prior ventral hernia repair. Musculoskeletal: No acute or destructive bony abnormalities. Reconstructed images demonstrate no additional findings. IMPRESSION: 1. Prominent bowel distension extending from the mid jejunum through the  rectum, with numerous small bowel gas fluid levels identified. Without clear transition point, and given the diffuse distension of the entirety of the distal bowel, ileus is favored over distal colonic obstruction. 2. Interval development of intrahepatic and extrahepatic biliary duct dilation, without clear cause for obstruction or evidence of choledocholithiasis. Please correlate with liver function tests. 3. Cholelithiasis without evidence of acute cholecystitis. 4. Trace free fluid within the bilateral paracolic gutters. 5.  Aortic Atherosclerosis (ICD10-I70.0). Electronically Signed   By: Sharlet Salina M.D.   On: 03/06/2023 19:59    Microbiology: Results for orders placed or performed during the hospital encounter of 01/31/17  Surgical PCR screen     Status: None   Collection Time: 01/31/17 12:56 PM   Specimen: Nasal Mucosa; Nasal Swab  Result Value Ref Range Status   MRSA, PCR NEGATIVE NEGATIVE Final   Staphylococcus aureus NEGATIVE NEGATIVE Final    Comment: (NOTE) The Xpert SA Assay (FDA approved for NASAL specimens in patients 38 years of age and older), is one component of a comprehensive surveillance program. It is not intended to diagnose infection nor to guide or monitor treatment.     Labs: CBC: Recent Labs  Lab 03/06/23 1533 03/07/23 0636 03/09/23 0608  WBC 9.6 7.0 5.6  NEUTROABS 6.0  --   --   HGB 11.9* 10.0* 9.8*  HCT 37.1 31.3* 29.8*  MCV 93.9 91.8 91.1  PLT 227  168 155   Basic Metabolic Panel: Recent Labs  Lab 03/06/23 1533 03/07/23 0636 03/09/23 0608  NA 137 139 138  K 3.9 3.7 3.6  CL 102 106 101  CO2 24 24 26   GLUCOSE 106* 73 84  BUN 18 18 14   CREATININE 1.26* 1.14* 1.12*  CALCIUM 10.0 8.7* 8.8*   Liver Function Tests: Recent Labs  Lab 03/06/23 1533 03/07/23 0636 03/09/23 0608  AST 20 16 22   ALT 16 13 16   ALKPHOS 53 43 42  BILITOT 1.1 1.0 1.1  PROT 6.4* 5.4* 5.2*  ALBUMIN 3.8 3.2* 3.0*   CBG: No results for input(s): "GLUCAP" in the  last 168 hours.  Discharge time spent: greater than 30 minutes.  Signed: Meredeth Ide, MD [facility] 03/11/2023

## 2023-03-11 NOTE — Progress Notes (Signed)
   03/11/23 1004  Mobility  Activity Ambulated independently to bathroom;Ambulated independently in hallway  Level of Assistance Standby assist, set-up cues, supervision of patient - no hands on  Assistive Device None  Distance Ambulated (ft) 550 ft  Activity Response Tolerated fair  Mobility Referral Yes  $Mobility charge 1 Mobility  Mobility Specialist Start Time (ACUTE ONLY) 0956  Mobility Specialist Stop Time (ACUTE ONLY) 1004  Mobility Specialist Time Calculation (min) (ACUTE ONLY) 8 min   Mobility Specialist: Progress Note  Post Mobility: HR 73-76, SpO2 94-96% RA Pt agreeable to mobility session - received in chair. Required SV with no AD. C/o throat soreness. Returned to ambulating ind in hallway with all needs met.  Pt stated she has been ambulating several laps independently throughout the day.  Barnie Mort, BS Mobility Specialist Please contact via SecureChat or Rehab office at [phone].

## 2023-03-11 NOTE — Plan of Care (Signed)
  Problem: Pain Management: Goal: General experience of comfort will improve Outcome: Progressing   Problem: Safety: Goal: Ability to remain free from injury will improve Outcome: Progressing

## 2023-03-12 ENCOUNTER — Encounter (HOSPITAL_COMMUNITY): Payer: Self-pay | Admitting: Gastroenterology

## 2023-04-03 ENCOUNTER — Other Ambulatory Visit: Payer: Self-pay | Admitting: Gastroenterology

## 2023-04-03 ENCOUNTER — Ambulatory Visit
Admission: RE | Admit: 2023-04-03 | Discharge: 2023-04-03 | Disposition: A | Discharge status: Home / Self Care | Payer: Medicare HMO | Source: Ambulatory Visit | Attending: Gastroenterology | Admitting: Gastroenterology

## 2023-04-03 DIAGNOSIS — R1010 Upper abdominal pain, unspecified: Secondary | ICD-10-CM

## 2023-06-13 ENCOUNTER — Other Ambulatory Visit: Payer: Self-pay

## 2023-06-13 ENCOUNTER — Emergency Department (HOSPITAL_BASED_OUTPATIENT_CLINIC_OR_DEPARTMENT_OTHER): Payer: Medicare HMO

## 2023-06-13 ENCOUNTER — Emergency Department (HOSPITAL_BASED_OUTPATIENT_CLINIC_OR_DEPARTMENT_OTHER)
Admission: EM | Admit: 2023-06-13 | Discharge: 2023-06-13 | Disposition: A | Discharge status: Home / Self Care | Payer: Medicare HMO | Attending: Emergency Medicine | Admitting: Emergency Medicine

## 2023-06-13 ENCOUNTER — Encounter (HOSPITAL_BASED_OUTPATIENT_CLINIC_OR_DEPARTMENT_OTHER): Payer: Self-pay

## 2023-06-13 DIAGNOSIS — R109 Unspecified abdominal pain: Secondary | ICD-10-CM | POA: Insufficient documentation

## 2023-06-13 LAB — URINALYSIS, ROUTINE W REFLEX MICROSCOPIC
Bilirubin Urine: NEGATIVE
Glucose, UA: NEGATIVE mg/dL
Hgb urine dipstick: NEGATIVE
Ketones, ur: NEGATIVE mg/dL
Leukocytes,Ua: NEGATIVE
Nitrite: NEGATIVE
Protein, ur: NEGATIVE mg/dL
Specific Gravity, Urine: 1.012 (ref 1.005–1.030)
pH: 5.5 (ref 5.0–8.0)

## 2023-06-13 LAB — COMPREHENSIVE METABOLIC PANEL
ALT: 10 U/L (ref 0–44)
AST: 16 U/L (ref 15–41)
Albumin: 3.9 g/dL (ref 3.5–5.0)
Alkaline Phosphatase: 48 U/L (ref 38–126)
Anion gap: 10 (ref 5–15)
BUN: 15 mg/dL (ref 8–23)
CO2: 29 mmol/L (ref 22–32)
Calcium: 9 mg/dL (ref 8.9–10.3)
Chloride: 100 mmol/L (ref 98–111)
Creatinine, Ser: 0.9 mg/dL (ref 0.44–1.00)
GFR, Estimated: >60 mL/min (ref >60)
Glucose, Bld: 98 mg/dL (ref 70–99)
Potassium: 3.5 mmol/L (ref 3.5–5.1)
Sodium: 139 mmol/L (ref 135–145)
Total Bilirubin: 0.5 mg/dL (ref 0.0–1.2)
Total Protein: 6.5 g/dL (ref 6.5–8.1)

## 2023-06-13 LAB — CBC
HCT: 35.4 % — ABNORMAL LOW (ref 36.0–46.0)
Hemoglobin: 11.8 g/dL — ABNORMAL LOW (ref 12.0–15.0)
MCH: 29.9 pg (ref 26.0–34.0)
MCHC: 33.3 g/dL (ref 30.0–36.0)
MCV: 89.6 fL (ref 80.0–100.0)
Platelets: 220 10*3/uL (ref 150–400)
RBC: 3.95 MIL/uL (ref 3.87–5.11)
RDW: 14 % (ref 11.5–15.5)
WBC: 8.8 10*3/uL (ref 4.0–10.5)
nRBC: 0 % (ref 0.0–0.2)

## 2023-06-13 LAB — LIPASE, BLOOD: Lipase: 263 U/L — ABNORMAL HIGH (ref 11–51)

## 2023-06-13 MED ORDER — IOHEXOL 300 MG/ML  SOLN
100.0000 mL | Freq: Once | INTRAMUSCULAR | Status: AC | PRN
  Administered 2023-06-13: 100 mL via INTRAVENOUS

## 2023-06-13 NOTE — ED Provider Notes (Signed)
[place] EMERGENCY DEPARTMENT AT [facility] Provider Note   CSN: [id number] Arrival date & time: 06/13/23  1740     History Chief Complaint  Patient presents with   Abdominal Pain    HPI Katherine Floyd is a 78 y.o. female presenting for intermittent abdominal pain x 4 days. Also having URI symptoms. N/V/D. Hx of Diverticulitis.  Patient's recorded medical, surgical, social, medication list and allergies were reviewed in the Snapshot window as part of the initial history.   Review of Systems   Review of Systems  Constitutional:  Negative for chills and fever.  HENT:  Negative for ear pain and sore throat.   Eyes:  Negative for pain and visual disturbance.  Respiratory:  Negative for cough and shortness of breath.   Cardiovascular:  Negative for chest pain and palpitations.  Gastrointestinal:  Positive for abdominal pain, constipation and diarrhea. Negative for vomiting.  Genitourinary:  Negative for dysuria and hematuria.  Musculoskeletal:  Negative for arthralgias and back pain.  Skin:  Negative for color change and rash.  Neurological:  Negative for seizures and syncope.  All other systems reviewed and are negative.   Physical Exam Updated Vital Signs BP 138/68   Pulse 65   Temp 98.5 F (36.9 C) (Oral)   Resp 18   Ht 4\' 11"  (1.499 m)   Wt 45 kg   SpO2 100%   BMI 20.04 kg/m  Physical Exam Vitals and nursing note reviewed.  Constitutional:      General: She is not in acute distress.    Appearance: She is well-developed.  HENT:     Head: Normocephalic and atraumatic.  Eyes:     Conjunctiva/sclera: Conjunctivae normal.  Cardiovascular:     Rate and Rhythm: Normal rate and regular rhythm.     Heart sounds: No murmur heard. Pulmonary:     Effort: Pulmonary effort is normal. No respiratory distress.     Breath sounds: Normal breath sounds.  Abdominal:     General: There is no distension.     Palpations: Abdomen is soft.     Tenderness: There  is abdominal tenderness. There is no right CVA tenderness or left CVA tenderness.  Musculoskeletal:        General: No swelling or tenderness. Normal range of motion.     Cervical back: Neck supple.  Skin:    General: Skin is warm and dry.  Neurological:     General: No focal deficit present.     Mental Status: She is alert and oriented to person, place, and time. Mental status is at baseline.     Cranial Nerves: No cranial nerve deficit.      ED Course/ Medical Decision Making/ A&P    Procedures Procedures   Medications Ordered in ED Medications  iohexol (OMNIPAQUE) 300 MG/ML solution 100 mL (100 mLs Intravenous Contrast Given 06/13/23 2124)   Medical Decision Making:   [name] is a 78 y.o. female who presented to the ED today with abdominal pain, detailed above.    Patient placed on continuous vitals and telemetry monitoring while in ED which was reviewed periodically.  Complete initial physical exam performed, notably the patient  was HDS in NAD.     Reviewed and confirmed nursing documentation for past medical history, family history, social history.    Initial Assessment:   With the patient's presentation of abdominal pain, most likely diagnosis is nonspecific etiology. Other diagnoses were considered including (but not limited to) gastroenteritis, colitis, small  bowel obstruction, appendicitis, cholecystitis, pancreatitis, nephrolithiasis, UTI, pyleonephritis, . These are considered less likely due to history of present illness and physical exam findings.   This is most consistent with an acute life/limb threatening illness complicated by underlying chronic conditions.   Initial Plan:  CBC/CMP to evaluate for underlying infectious/metabolic etiology for patient's abdominal pain  Lipase to evaluate for pancreatitis  CTAB/Pelvis with contrast to evaluate for structural/surgical etiology of patients' severe abdominal pain.  Urinalysis and repeat physical assessment  to evaluate for UTI/Pyelonpehritis  Empiric management of symptoms with escalating pain control and antiemetics as needed.   Initial Study Results:   Laboratory  All laboratory results reviewed without evidence of clinically relevant pathology.    EKG EKG was reviewed independently. Rate, rhythm, axis, intervals all examined and without medically relevant abnormality. ST segments without concerns for elevations.    Radiology All images reviewed independently. Agree with radiology report at this time.   CT ABDOMEN PELVIS W CONTRAST Result Date: 06/13/2023 CLINICAL DATA:  Acute abdominal pain EXAM: CT ABDOMEN AND PELVIS WITH CONTRAST TECHNIQUE: Multidetector CT imaging of the abdomen and pelvis was performed using the standard protocol following bolus administration of intravenous contrast. RADIATION DOSE REDUCTION: This exam was performed according to the departmental dose-optimization program which includes automated exposure control, adjustment of the mA and/or kV according to patient size and/or use of iterative reconstruction technique. CONTRAST:  OMNIPAQUE IOHEXOL 300 MG/ML  SOLN COMPARISON:  03/06/2023 FINDINGS: Lower chest: No acute abnormality. Hepatobiliary: Liver is within normal limits. Gallbladder is distended with dependent gallstones stable from the prior exam. Previously seen common bile duct dilatation has resolved in the interval. Pancreas: Unremarkable. No pancreatic ductal dilatation or surrounding inflammatory changes. Spleen: Normal in size without focal abnormality. Adrenals/Urinary Tract: Adrenal glands are within normal limits. Kidneys demonstrate a normal enhancement pattern bilaterally. Normal excretion is noted bilaterally. No calculi are seen. The bladder is decompressed. Stomach/Bowel: Considerable retained fecal material is noted within the rectosigmoid increased when compared with the prior exam. Multiple ingested tablets are noted within the rectum. Changes  consistent with prior right hemicolectomy are noted. Gaseous distension of the proximal colon is noted. Stomach and small bowel are within normal limits. Vascular/Lymphatic: Aortic atherosclerosis. No enlarged abdominal or pelvic lymph nodes. Reproductive: Status post hysterectomy. No adnexal masses. Other: No abdominal wall hernia or abnormality. No abdominopelvic ascites. Musculoskeletal: No acute or significant osseous findings. Postsurgical changes in the anterior abdominal wall are again noted. IMPRESSION: Colonic distension with air and fecal material as described. This may represent a colonic ileus. Cholelithiasis without complicating factors. Resolution of previously seen biliary ductal dilatation. Electronically Signed   By: Alcide Clever M.D.   On: 06/13/2023 22:14     Final Reassessment and Plan:   Patient's history present illness and physicals exam findings revealed a possible colonic ileus though nonspecific. Lipase mildly elevated.  Long conversation with the patient at bedside.  I recommended admission for observation, slow return to p.o. tolerance and ongoing management.  However patient was adamantly against being admitted unless it was 100% necessary. States that she feels grossly improved throughout the day today has been able to tolerate p.o. intake throughout the day and feels that her pain is completely resolved. She had a bowel movement normally earlier today and states that she is very motivated to be managed in the outpatient setting to follow-up with PCP.  Shared medical decision making and explicitly discussed risks.   Disposition:  Patient is requesting discharge at this  time.  Given patient's understanding of risk of severe missed diagnosis based on limitations of today's evaluation and risk of interval worsening of disease including life or limb threatening pathology, will participate in shared medical decision making and patient directed discharge at this time.  Patient is  welcome to return for further diagnostic evaluation/therapeutic management at any time.   Emergency Department Medication Summary:   Medications  iohexol (OMNIPAQUE) 300 MG/ML solution 100 mL (100 mLs Intravenous Contrast Given 06/13/23 2124)     Clinical Impression:  1. Abdominal pain, unspecified abdominal location      Discharge   Final Clinical Impression(s) / ED Diagnoses Final diagnoses:  Abdominal pain, unspecified abdominal location    Rx / DC Orders ED Discharge Orders     None         Glyn Ade, MD 06/13/23 2238

## 2023-06-13 NOTE — ED Triage Notes (Signed)
States sent by MD to get a CT scan  States onset couple of days spasmic pain to abd. With nausea and vomiting.  No vomiting today.

## 2023-07-03 ENCOUNTER — Emergency Department (HOSPITAL_COMMUNITY)

## 2023-07-03 ENCOUNTER — Inpatient Hospital Stay (HOSPITAL_COMMUNITY)
Admission: EM | Admit: 2023-07-03 | Discharge: 2023-07-29 | DRG: 393 | Disposition: E | Source: Ambulatory Visit | Attending: Internal Medicine | Admitting: Internal Medicine

## 2023-07-03 ENCOUNTER — Other Ambulatory Visit: Payer: Self-pay

## 2023-07-03 DIAGNOSIS — R739 Hyperglycemia, unspecified: Secondary | ICD-10-CM | POA: Diagnosis present

## 2023-07-03 DIAGNOSIS — I480 Paroxysmal atrial fibrillation: Secondary | ICD-10-CM | POA: Diagnosis present

## 2023-07-03 DIAGNOSIS — Z860100 Personal history of colon polyps, unspecified: Secondary | ICD-10-CM

## 2023-07-03 DIAGNOSIS — K839 Disease of biliary tract, unspecified: Secondary | ICD-10-CM | POA: Diagnosis present

## 2023-07-03 DIAGNOSIS — Z7982 Long term (current) use of aspirin: Secondary | ICD-10-CM

## 2023-07-03 DIAGNOSIS — I110 Hypertensive heart disease with heart failure: Secondary | ICD-10-CM | POA: Diagnosis present

## 2023-07-03 DIAGNOSIS — Z87891 Personal history of nicotine dependence: Secondary | ICD-10-CM

## 2023-07-03 DIAGNOSIS — K6389 Other specified diseases of intestine: Principal | ICD-10-CM

## 2023-07-03 DIAGNOSIS — R948 Abnormal results of function studies of other organs and systems: Secondary | ICD-10-CM | POA: Diagnosis present

## 2023-07-03 DIAGNOSIS — K5981 Ogilvie syndrome: Secondary | ICD-10-CM | POA: Diagnosis present

## 2023-07-03 DIAGNOSIS — Z9049 Acquired absence of other specified parts of digestive tract: Secondary | ICD-10-CM

## 2023-07-03 DIAGNOSIS — Z515 Encounter for palliative care: Secondary | ICD-10-CM

## 2023-07-03 DIAGNOSIS — Z681 Body mass index (BMI) 19 or less, adult: Secondary | ICD-10-CM

## 2023-07-03 DIAGNOSIS — Z79899 Other long term (current) drug therapy: Secondary | ICD-10-CM

## 2023-07-03 DIAGNOSIS — K56691 Other complete intestinal obstruction: Secondary | ICD-10-CM | POA: Diagnosis present

## 2023-07-03 DIAGNOSIS — K5641 Fecal impaction: Secondary | ICD-10-CM

## 2023-07-03 DIAGNOSIS — Z800 Family history of malignant neoplasm of digestive organs: Secondary | ICD-10-CM

## 2023-07-03 DIAGNOSIS — I5032 Chronic diastolic (congestive) heart failure: Secondary | ICD-10-CM | POA: Diagnosis present

## 2023-07-03 DIAGNOSIS — K624 Stenosis of anus and rectum: Principal | ICD-10-CM | POA: Diagnosis present

## 2023-07-03 DIAGNOSIS — J9691 Respiratory failure, unspecified with hypoxia: Secondary | ICD-10-CM | POA: Diagnosis not present

## 2023-07-03 DIAGNOSIS — K56609 Unspecified intestinal obstruction, unspecified as to partial versus complete obstruction: Secondary | ICD-10-CM | POA: Diagnosis not present

## 2023-07-03 DIAGNOSIS — J45909 Unspecified asthma, uncomplicated: Secondary | ICD-10-CM | POA: Diagnosis present

## 2023-07-03 DIAGNOSIS — Z880 Allergy status to penicillin: Secondary | ICD-10-CM

## 2023-07-03 DIAGNOSIS — Z888 Allergy status to other drugs, medicaments and biological substances status: Secondary | ICD-10-CM

## 2023-07-03 DIAGNOSIS — K8020 Calculus of gallbladder without cholecystitis without obstruction: Secondary | ICD-10-CM | POA: Diagnosis present

## 2023-07-03 DIAGNOSIS — Z781 Physical restraint status: Secondary | ICD-10-CM

## 2023-07-03 DIAGNOSIS — Z90710 Acquired absence of both cervix and uterus: Secondary | ICD-10-CM

## 2023-07-03 DIAGNOSIS — E46 Unspecified protein-calorie malnutrition: Secondary | ICD-10-CM | POA: Diagnosis present

## 2023-07-03 DIAGNOSIS — K55032 Diffuse acute (reversible) ischemia of large intestine: Secondary | ICD-10-CM | POA: Diagnosis not present

## 2023-07-03 DIAGNOSIS — Z803 Family history of malignant neoplasm of breast: Secondary | ICD-10-CM

## 2023-07-03 DIAGNOSIS — K219 Gastro-esophageal reflux disease without esophagitis: Secondary | ICD-10-CM | POA: Diagnosis present

## 2023-07-03 DIAGNOSIS — E876 Hypokalemia: Secondary | ICD-10-CM | POA: Diagnosis present

## 2023-07-03 DIAGNOSIS — K602 Anal fissure, unspecified: Secondary | ICD-10-CM

## 2023-07-03 DIAGNOSIS — K581 Irritable bowel syndrome with constipation: Secondary | ICD-10-CM | POA: Diagnosis present

## 2023-07-03 DIAGNOSIS — K5900 Constipation, unspecified: Secondary | ICD-10-CM

## 2023-07-03 DIAGNOSIS — R109 Unspecified abdominal pain: Secondary | ICD-10-CM | POA: Diagnosis not present

## 2023-07-03 DIAGNOSIS — Z87442 Personal history of urinary calculi: Secondary | ICD-10-CM

## 2023-07-03 DIAGNOSIS — J690 Pneumonitis due to inhalation of food and vomit: Secondary | ICD-10-CM

## 2023-07-03 DIAGNOSIS — K5939 Other megacolon: Secondary | ICD-10-CM | POA: Diagnosis present

## 2023-07-03 DIAGNOSIS — Z66 Do not resuscitate: Secondary | ICD-10-CM | POA: Diagnosis not present

## 2023-07-03 DIAGNOSIS — E872 Acidosis, unspecified: Secondary | ICD-10-CM | POA: Diagnosis not present

## 2023-07-03 DIAGNOSIS — W449XXA Unspecified foreign body entering into or through a natural orifice, initial encounter: Secondary | ICD-10-CM | POA: Diagnosis present

## 2023-07-03 DIAGNOSIS — I10 Essential (primary) hypertension: Secondary | ICD-10-CM | POA: Diagnosis present

## 2023-07-03 DIAGNOSIS — R188 Other ascites: Secondary | ICD-10-CM | POA: Diagnosis present

## 2023-07-03 DIAGNOSIS — Z7951 Long term (current) use of inhaled steroids: Secondary | ICD-10-CM

## 2023-07-03 DIAGNOSIS — T184XXA Foreign body in colon, initial encounter: Secondary | ICD-10-CM | POA: Diagnosis present

## 2023-07-03 DIAGNOSIS — Z881 Allergy status to other antibiotic agents status: Secondary | ICD-10-CM

## 2023-07-03 DIAGNOSIS — K644 Residual hemorrhoidal skin tags: Secondary | ICD-10-CM | POA: Diagnosis present

## 2023-07-03 DIAGNOSIS — M810 Age-related osteoporosis without current pathological fracture: Secondary | ICD-10-CM | POA: Diagnosis present

## 2023-07-03 LAB — CBC
HCT: 36.6 % (ref 36.0–46.0)
Hemoglobin: 12.2 g/dL (ref 12.0–15.0)
MCH: 29.8 pg (ref 26.0–34.0)
MCHC: 33.3 g/dL (ref 30.0–36.0)
MCV: 89.3 fL (ref 80.0–100.0)
Platelets: 264 10*3/uL (ref 150–400)
RBC: 4.1 MIL/uL (ref 3.87–5.11)
RDW: 14.3 % (ref 11.5–15.5)
WBC: 7.8 10*3/uL (ref 4.0–10.5)
nRBC: 0 % (ref 0.0–0.2)

## 2023-07-03 LAB — COMPREHENSIVE METABOLIC PANEL
ALT: 18 U/L (ref 0–44)
AST: 22 U/L (ref 15–41)
Albumin: 3.6 g/dL (ref 3.5–5.0)
Alkaline Phosphatase: 55 U/L (ref 38–126)
Anion gap: 9 (ref 5–15)
BUN: 9 mg/dL (ref 8–23)
CO2: 30 mmol/L (ref 22–32)
Calcium: 9.3 mg/dL (ref 8.9–10.3)
Chloride: 100 mmol/L (ref 98–111)
Creatinine, Ser: 1.08 mg/dL — ABNORMAL HIGH (ref 0.44–1.00)
GFR, Estimated: 52 mL/min — ABNORMAL LOW (ref >60)
Glucose, Bld: 101 mg/dL — ABNORMAL HIGH (ref 70–99)
Potassium: 3 mmol/L — ABNORMAL LOW (ref 3.5–5.1)
Sodium: 139 mmol/L (ref 135–145)
Total Bilirubin: 0.7 mg/dL (ref 0.0–1.2)
Total Protein: 6.2 g/dL — ABNORMAL LOW (ref 6.5–8.1)

## 2023-07-03 LAB — URINALYSIS, ROUTINE W REFLEX MICROSCOPIC
Bilirubin Urine: NEGATIVE
Glucose, UA: NEGATIVE mg/dL
Hgb urine dipstick: NEGATIVE
Ketones, ur: NEGATIVE mg/dL
Leukocytes,Ua: NEGATIVE
Nitrite: NEGATIVE
Protein, ur: NEGATIVE mg/dL
Specific Gravity, Urine: 1.002 — ABNORMAL LOW (ref 1.005–1.030)
pH: 7 (ref 5.0–8.0)

## 2023-07-03 LAB — LIPASE, BLOOD: Lipase: 60 U/L — ABNORMAL HIGH (ref 11–51)

## 2023-07-03 MED ORDER — METOPROLOL TARTRATE 5 MG/5ML IV SOLN: 5.0000 mg | Freq: Four times a day (QID) | INTRAVENOUS | Status: DC | PRN

## 2023-07-03 MED ORDER — HYOSCYAMINE SULFATE 0.125 MG SL SUBL
0.1250 mg | SUBLINGUAL_TABLET | Freq: Four times a day (QID) | SUBLINGUAL | Status: DC | PRN
  Filled 2023-07-03: qty 1

## 2023-07-03 MED ORDER — FLUTICASONE PROPIONATE 50 MCG/ACT NA SUSP
2.0000 | Freq: Two times a day (BID) | NASAL | Status: DC | PRN
Start: 2023-07-03 — End: 2023-07-07

## 2023-07-03 MED ORDER — ENOXAPARIN SODIUM 40 MG/0.4ML IJ SOSY
40.0000 mg | PREFILLED_SYRINGE | INTRAMUSCULAR | Status: DC
  Administered 2023-07-03 – 2023-07-04 (×2): 40 mg via SUBCUTANEOUS
  Filled 2023-07-03 (×2): qty 0.4

## 2023-07-03 MED ORDER — MONTELUKAST SODIUM 10 MG PO TABS
10.0000 mg | ORAL_TABLET | Freq: Every day | ORAL | Status: DC
  Administered 2023-07-03 – 2023-07-05 (×2): 10 mg via ORAL
  Filled 2023-07-03 (×2): qty 1

## 2023-07-03 MED ORDER — ATORVASTATIN CALCIUM 10 MG PO TABS
10.0000 mg | ORAL_TABLET | Freq: Every day | ORAL | Status: DC
  Administered 2023-07-03 – 2023-07-05 (×2): 10 mg via ORAL
  Filled 2023-07-03 (×2): qty 1

## 2023-07-03 MED ORDER — POTASSIUM CHLORIDE 10 MEQ/100ML IV SOLN
10.0000 meq | INTRAVENOUS | Status: AC
Start: 2023-07-03 — End: 2023-07-03
  Administered 2023-07-03 (×2): 10 meq via INTRAVENOUS
  Filled 2023-07-03 (×2): qty 100

## 2023-07-03 MED ORDER — LINACLOTIDE 145 MCG PO CAPS
145.0000 ug | ORAL_CAPSULE | Freq: Every morning | ORAL | Status: DC
  Administered 2023-07-04 – 2023-07-05 (×2): 145 ug via ORAL
  Filled 2023-07-03 (×2): qty 1

## 2023-07-03 MED ORDER — FAMOTIDINE 20 MG PO TABS
20.0000 mg | ORAL_TABLET | Freq: Every day | ORAL | Status: DC
  Administered 2023-07-04 – 2023-07-05 (×2): 20 mg via ORAL
  Filled 2023-07-03 (×2): qty 1

## 2023-07-03 MED ORDER — ALBUTEROL SULFATE (2.5 MG/3ML) 0.083% IN NEBU: 2.5000 mg | INHALATION_SOLUTION | Freq: Four times a day (QID) | RESPIRATORY_TRACT | Status: DC | PRN

## 2023-07-03 MED ORDER — LIDOCAINE HCL URETHRAL/MUCOSAL 2 % EX GEL: 1.0000 | Freq: Once | CUTANEOUS | Status: DC

## 2023-07-03 MED ORDER — MOMETASONE FURO-FORMOTEROL FUM 200-5 MCG/ACT IN AERO
2.0000 | INHALATION_SPRAY | Freq: Two times a day (BID) | RESPIRATORY_TRACT | Status: DC
  Administered 2023-07-05: 2 via RESPIRATORY_TRACT
  Filled 2023-07-03: qty 8.8

## 2023-07-03 MED ORDER — FUROSEMIDE 40 MG PO TABS
40.0000 mg | ORAL_TABLET | Freq: Every day | ORAL | Status: DC
  Administered 2023-07-04 – 2023-07-05 (×2): 40 mg via ORAL
  Filled 2023-07-03 (×2): qty 1

## 2023-07-03 MED ORDER — LORATADINE 10 MG PO TABS
10.0000 mg | ORAL_TABLET | Freq: Every day | ORAL | Status: DC
  Administered 2023-07-04 – 2023-07-05 (×2): 10 mg via ORAL
  Filled 2023-07-03 (×2): qty 1

## 2023-07-03 MED ORDER — ACETAMINOPHEN 650 MG RE SUPP: 650.0000 mg | Freq: Four times a day (QID) | RECTAL | Status: DC | PRN

## 2023-07-03 MED ORDER — POTASSIUM CHLORIDE CRYS ER 20 MEQ PO TBCR
40.0000 meq | EXTENDED_RELEASE_TABLET | Freq: Two times a day (BID) | ORAL | Status: DC
  Administered 2023-07-03 – 2023-07-05 (×4): 40 meq via ORAL
  Filled 2023-07-03 (×4): qty 2

## 2023-07-03 MED ORDER — LACTATED RINGERS IV SOLN: INTRAVENOUS | Status: AC

## 2023-07-03 MED ORDER — ACETAMINOPHEN 325 MG PO TABS
650.0000 mg | ORAL_TABLET | Freq: Four times a day (QID) | ORAL | Status: DC | PRN
  Administered 2023-07-04 – 2023-07-05 (×2): 650 mg via ORAL
  Filled 2023-07-03 (×2): qty 2

## 2023-07-03 MED ORDER — PROCHLORPERAZINE EDISYLATE 10 MG/2ML IJ SOLN: 10.0000 mg | Freq: Four times a day (QID) | INTRAMUSCULAR | Status: DC | PRN

## 2023-07-03 MED ORDER — ACETAMINOPHEN 325 MG PO TABS
650.0000 mg | ORAL_TABLET | Freq: Once | ORAL | Status: AC
  Administered 2023-07-03: 650 mg via ORAL
  Filled 2023-07-03: qty 2

## 2023-07-03 MED ORDER — HYOSCYAMINE SULFATE 0.125 MG PO TABS: 0.1250 mg | ORAL_TABLET | Freq: Four times a day (QID) | ORAL | Status: DC | PRN

## 2023-07-03 MED ORDER — ALPRAZOLAM 0.25 MG PO TABS: 0.2500 mg | ORAL_TABLET | Freq: Two times a day (BID) | ORAL | Status: DC | PRN

## 2023-07-03 MED ORDER — IOHEXOL 350 MG/ML SOLN
75.0000 mL | Freq: Once | INTRAVENOUS | Status: AC | PRN
  Administered 2023-07-03: 75 mL via INTRAVENOUS

## 2023-07-03 NOTE — Plan of Care (Signed)

## 2023-07-03 NOTE — Assessment & Plan Note (Addendum)
 No evidence of volume overload. Continue daily Lasix Continue beta-blocker

## 2023-07-03 NOTE — Assessment & Plan Note (Signed)
 Paroxysmal, appears to be in sinus rhythm at present.

## 2023-07-03 NOTE — Assessment & Plan Note (Signed)
Replete and trend 

## 2023-07-03 NOTE — ED Triage Notes (Signed)
 Pt. Stated, I was sent over here cause I have a bowel obstruction, I have abdominal bloating, watery stools for 2 weeks.

## 2023-07-03 NOTE — Assessment & Plan Note (Signed)
 Questionably noted on CT scan.  There is marked dilation of the large and small bowel with air-fluid levels. There is no nausea or vomiting.  Likely can avoid NG tube. Per GI bowel rest today, n.p.o. Consider additional agents in the morning to try to get her bowels moving.

## 2023-07-03 NOTE — Assessment & Plan Note (Addendum)
 Noted on CT, previously assessed with EGD in November 24

## 2023-07-03 NOTE — H&P (Signed)
 History and Physical    Patient: Katherine Floyd RUE:[id number] DOB: 20-Jan-1946 DOA: 07/03/2023 DOS: the patient was seen and examined on 07/03/2023 PCP: Richmond Campbell., PA-C  Patient coming from: Home  Chief Complaint:  Chief Complaint  Patient presents with   bowel obstruction   Abdominal Pain   HPI: Katherine Floyd is a 78 y.o. female with medical history significant of PAF not on AC, chronic constipation and IBS on Linzess, history of prior small bowel sent to [facility] ED from her GI practice this morning due to marked abdominal distention and concern for bowel obstruction.  Patient denies having significant nausea or vomiting.  She reports leaking liquid stool for the past few days including overnight and when she is up ambulating.  Is not the first time this has happened to her.  We are asked to admit the patient for bowel rest.  Review of Systems: As mentioned in the history of present illness. All other systems reviewed and are negative. Past Medical History:  Diagnosis Date   Anxiety    Arthritis    with fracture right great toe 08/30/11 from "stepping wrong"   Asthma    Dysrhythmia 06/21/2013   NEW ONSET ATRIAL FIBRILATION/RVR   GERD (gastroesophageal reflux disease)    Hypotension    Kidney stone    Osteoporosis    Polyp of colon, adenomatous    recurrent   Urinary tract infection 08/29/11   Cipro per PCP- states is resolving   Past Surgical History:  Procedure Laterality Date   ABDOMINAL HYSTERECTOMY  1984   BACK SURGERY  1997-1998   fell on ice and snow   BREAST LUMPECTOMY WITH RADIOACTIVE SEED LOCALIZATION Left 08/06/2019   Procedure: LEFT BREAST LUMPECTOMY WITH RADIOACTIVE SEED LOCALIZATION;  Surgeon: Griselda Miner, MD;  Location: [facility];  Service: General;  Laterality: Left;   BUNIONECTOMY  2002   right foot   ESOPHAGOGASTRODUODENOSCOPY (EGD) WITH PROPOFOL N/A 03/10/2023   Procedure: ESOPHAGOGASTRODUODENOSCOPY (EGD) WITH PROPOFOL;  Surgeon:  Jeani Hawking, MD;  Location: [facility] ENDOSCOPY;  Service: Gastroenterology;  Laterality: N/A;   EYE SURGERY  2000   cataract extraction with IOL   HAND SURGERY  1994   cyst removed   HEMICOLECTOMY  09/06/11   INCISIONAL HERNIA REPAIR N/A 01/31/2017   Procedure: LAPAROSCOPIC INCISIONAL HERNIA REPAIR WITH MESH;  Surgeon: Avel Peace, MD;  Location: [facility];  Service: General;  Laterality: N/A;   INSERTION OF MESH N/A 01/31/2017   Procedure: INSERTION OF MESH;  Surgeon: Avel Peace, MD;  Location: [facility];  Service: General;  Laterality: N/A;   KNEE SURGERY  1990   POLYPECTOMY     SHOULDER SURGERY  2007   right    TONSILLECTOMY  age of 34   UPPER ESOPHAGEAL ENDOSCOPIC ULTRASOUND (EUS)  03/10/2023   Procedure: UPPER ESOPHAGEAL ENDOSCOPIC ULTRASOUND (EUS);  Surgeon: Jeani Hawking, MD;  Location: [facility] ENDOSCOPY;  Service: Gastroenterology;;   uretheral dilitation     Social History:  reports that she quit smoking about 34 years ago. Her smoking use included cigarettes. She started smoking about 44 years ago. She has a 15 pack-year smoking history. She has never used smokeless tobacco. She reports that she does not drink alcohol and does not use drugs.  Allergies  Allergen Reactions   Elemental Sulfur Itching   Linaclotide Other (See Comments)    Other reaction(s): Other (See Comments)  Diarrhea   Patient dose not recognize   Prednisone Other (See Comments)  Patient states that 20 mgs of prednisone feel loopy.   Dicyclomine Rash   Doxycycline Rash   Penicillins Rash    Family History  Problem Relation Age of Onset   Cancer Mother        breast   Cancer Cousin        breast   Cancer Maternal Uncle        colon    Prior to Admission medications   Medication Sig Start Date End Date Taking? Authorizing Provider  acetaminophen (TYLENOL) 500 MG tablet Take 500 mg by mouth every 6 (six) hours as needed for mild pain (pain score 1-3) or moderate pain (pain score 4-6).   Yes  [provider]  albuterol (PROVENTIL) (2.5 MG/3ML) 0.083% nebulizer solution Take 2.5 mg by nebulization every 6 (six) hours as needed for wheezing or shortness of breath.    Yes [provider]  ALPRAZolam Prudy Feeler) 0.5 MG tablet Take 0.25 mg by mouth 2 (two) times daily as needed for anxiety. Anxiety 06/17/11  Yes [provider]  aspirin EC 81 MG tablet Take 1 tablet (81 mg total) by mouth daily. 04/18/15  Yes Lyn Records, MD  atorvastatin (LIPITOR) 10 MG tablet Take 1 tablet (10 mg total) by mouth at bedtime. 02/13/23  Yes Weaver, Scott T, PA-C  B Complex Vitamins (VITAMIN B-COMPLEX) TABS Take 1 tablet by mouth at bedtime.   Yes [provider]  budesonide-formoterol (SYMBICORT) 160-4.5 MCG/ACT inhaler Inhale 1 puff into the lungs 2 (two) times daily. 07/09/16  Yes [provider]  Calcium Carbonate-Vitamin D (CALCIUM-CARB 600 + D PO) Take 1 tablet by mouth at bedtime.   Yes [provider]  carboxymethylcellulose (REFRESH PLUS) 0.5 % SOLN Place 1 drop into both eyes 2 (two) times daily as needed. Dry eyes   Yes [provider]  cetirizine (ZYRTEC) 10 MG tablet Take 10 mg by mouth at bedtime.   Yes [provider]  famotidine (PEPCID) 20 MG tablet Take 20 mg by mouth daily. 06/25/23 08/24/23 Yes [provider]  fluticasone (FLONASE) 50 MCG/ACT nasal spray Place 2 sprays into both nostrils 2 (two) times daily as needed for allergies.  05/22/16  Yes [provider]  furosemide (LASIX) 40 MG tablet Take 1 tablet (40 mg total) by mouth daily. Patient taking differently: Take 20 mg by mouth daily. 02/13/23  Yes Weaver, Scott T, PA-C  hyoscyamine (LEVSIN) 0.125 MG tablet Take 0.125 mg by mouth every 6 (six) hours as needed for cramping. 06/25/23  Yes [provider]  LINZESS 145 MCG CAPS capsule Take 145 mcg by mouth every morning.   Yes [provider]  Lutein 6 MG CAPS Take 6 mg by mouth daily.    Yes [provider]  montelukast (SINGULAIR) 10 MG tablet Take 10 mg by mouth at bedtime.  07/17/11  Yes [provider]  ondansetron (ZOFRAN-ODT) 4 MG disintegrating tablet Take 4 mg by mouth every 8 (eight) hours as needed for nausea, vomiting or refractory nausea / vomiting. 06/13/23  Yes [provider]  potassium chloride SA (KLOR-CON M) 20 MEQ tablet Take 1 tablet (20 mEq total) by mouth daily. 02/12/23  Yes Weaver, Scott T, PA-C  vitamin E 400 UNIT capsule Take 400 Units by mouth at bedtime.   Yes [provider]    Physical Exam: Vitals:   07/03/23 1408 07/03/23 1500 07/03/23 1600 07/03/23 1700  BP: (!) 145/76 (!) 142/72 (!) 144/67 (!) 146/73  Pulse: 73  70 74 73  Resp: 18   18  Temp: 98.2 F (36.8 C)   98.1 F (36.7 C)  TempSrc:    Oral  SpO2: 100% 100% 99% 99%   Physical Examination: General appearance - alert, well appearing, and in no distress Chest - clear to auscultation, no wheezes, rales or rhonchi, symmetric air entry Heart - normal rate, regular rhythm, normal S1, S2, no murmurs, rubs, clicks or gallops Abdomen -markedly distended and tympanic abdomen Extremities - peripheral pulses normal, no pedal edema, no clubbing or cyanosis  Data Reviewed: Results for orders placed or performed during the hospital encounter of 07/03/23 (from the past 24 hours)  Lipase, blood     Status: Abnormal   Collection Time: 07/03/23 10:30 AM  Result Value Ref Range   Lipase 60 (H) 11 - 51 U/L  Comprehensive metabolic panel     Status: Abnormal   Collection Time: 07/03/23 10:30 AM  Result Value Ref Range   Sodium 139 135 - 145 mmol/L   Potassium 3.0 (L) 3.5 - 5.1 mmol/L   Chloride 100 98 - 111 mmol/L   CO2 30 22 - 32 mmol/L   Glucose, Bld 101 (H) 70 - 99 mg/dL   BUN 9 8 - 23 mg/dL   Creatinine, Ser 1.61 (H) 0.44 - 1.00 mg/dL   Calcium 9.3 8.9 - 09.6 mg/dL   Total Protein 6.2 (L) 6.5 - 8.1 g/dL   Albumin 3.6 3.5 - 5.0 g/dL   AST 22 15 - 41 U/L    ALT 18 0 - 44 U/L   Alkaline Phosphatase 55 38 - 126 U/L   Total Bilirubin 0.7 0.0 - 1.2 mg/dL   GFR, Estimated 52 (L) >60 mL/min   Anion gap 9 5 - 15  CBC     Status: None   Collection Time: 07/03/23 10:30 AM  Result Value Ref Range   WBC 7.8 4.0 - 10.5 K/uL   RBC 4.10 3.87 - 5.11 MIL/uL   Hemoglobin 12.2 12.0 - 15.0 g/dL   HCT 04.5 40.9 - 81.1 %   MCV 89.3 80.0 - 100.0 fL   MCH 29.8 26.0 - 34.0 pg   MCHC 33.3 30.0 - 36.0 g/dL   RDW 91.4 78.2 - 95.6 %   Platelets 264 150 - 400 K/uL   nRBC 0.0 0.0 - 0.2 %  Urinalysis, Routine w reflex microscopic -Urine, Clean Catch     Status: Abnormal   Collection Time: 07/03/23 11:32 AM  Result Value Ref Range   Color, Urine COLORLESS (A) YELLOW   APPearance CLEAR CLEAR   Specific Gravity, Urine 1.002 (L) 1.005 - 1.030   pH 7.0 5.0 - 8.0   Glucose, UA NEGATIVE NEGATIVE mg/dL   Hgb urine dipstick NEGATIVE NEGATIVE   Bilirubin Urine NEGATIVE NEGATIVE   Ketones, ur NEGATIVE NEGATIVE mg/dL   Protein, ur NEGATIVE NEGATIVE mg/dL   Nitrite NEGATIVE NEGATIVE   Leukocytes,Ua NEGATIVE NEGATIVE   CT ABDOMEN PELVIS W CONTRAST Result Date: 07/03/2023 CLINICAL DATA:  Bloating a watery stools for 2 weeks. Possible obstruction. EXAM: CT ABDOMEN AND PELVIS WITH CONTRAST TECHNIQUE: Multidetector CT imaging of the abdomen and pelvis was performed using the standard protocol following bolus administration of intravenous contrast. RADIATION DOSE REDUCTION: This exam was performed according to the departmental dose-optimization program which includes automated exposure control, adjustment of the mA and/or kV according to patient size and/or use of iterative reconstruction technique. CONTRAST:  75mL OMNIPAQUE IOHEXOL 350 MG/ML SOLN COMPARISON:  CT 06/13/2023. older  CT MRI exams as well. FINDINGS: Lower chest: There is some linear opacity seen at the left lower lobe greater than lingula and right lower lobe, areas of scar atelectatic change. Similar distribution to  previous. The opacity left lower lobe appears increased. No significant pleural effusion. Coronary artery calcifications are seen. Small hiatal hernia. Hepatobiliary: Stones seen in the nondilated urinary bladder. There is some ectasia of the biliary tree which appears slightly more prominent today than previous. Normal tapering is not as gradual as expected. Slight intrahepatic biliary duct ectasia as well. No space-occupying liver lesion. Patent portal vein. Pancreas: Unremarkable. No pancreatic ductal dilatation or surrounding inflammatory changes. Spleen: Normal in size without focal abnormality. Adrenals/Urinary Tract: The adrenal glands are preserved. No enhancing renal mass or collecting system dilatation. The ureters have a normal course and caliber extending down to the urinary bladder. Preserved contour to the urinary bladder. Stomach/Bowel: No oral contrast. The stomach is nondilated. Duodenum is relatively collapsed as well. The colon is dilated insert lay areas with areas of mild wall/fold thickening. Some loops are dilated up to 10.5 cm such as along the rectum. There is a very large amount of stool in the rectum as well as impacted stool suggested throughout the redundant sigmoid colon with few air-fluid levels. Also significant stool along the descending colon. There are stool fluid levels along the transverse colon which are dilated as well up to 9.8 cm. Surgical changes along the right side of the colon. In addition there is some dilated loops of small bowel mid abdomen with some air-fluid levels measuring up to 4.2 cm. Few areas of high attenuation debris along the colon which could be ingested contents. No areas of pneumatosis. No portal venous gas suggested. Vascular/Lymphatic: Diffuse vascular calcifications along the aorta and branch vessels. Normal caliber aorta and IVC. No discrete abnormal lymph node enlargement identified in the abdomen and pelvis. Reproductive: Status post hysterectomy.  No adnexal masses. Other: Anasarca. Presumed mesh along the anterior abdominal wall for hernia repair. Few areas of free fluid particularly along the right side of the abdomen, adjacent to liver and pericolic gutter. Increased. Musculoskeletal: Osteopenia. Degenerative changes seen spine pelvis. Changes of previous right hemilaminectomy at L5. Multilevel disc bulging. Stable compression deformity of the superior endplate of L1 with Schmorl's node. Canal stenosis seen particularly at L4-5. IMPRESSION: Extensive colonic stool identified with dilatation of the colon measuring up to 10.5 cm. Several air-fluid levels as well in areas less impacted by stool. Please correlate for any evidence of a distal stenosis at the anorectal region with these changes and chronicity. Several mildly dilated loops of small bowel as well with air-fluid levels. This could be secondary to the colonic process but would recommend follow-up. Slight increase in trace ascites. Stones in the gallbladder. There is mild biliary duct dilatation which is developing from the prior examination. There is some tapering of the distal common duct but not smoothly as expected. With the interval change, an ampullary lesion is difficult to completely exclude. Please correlate with any clinical presentation and prior workup. Additional evaluation as clinically appropriate Surgical changes identified along the right side of the colon. Spine surgery as well. Anterior abdominal wall mesh. Electronically Signed   By: Karen Kays M.D.   On: 07/03/2023 15:05   CT ABDOMEN PELVIS W CONTRAST Result Date: 06/13/2023 CLINICAL DATA:  Acute abdominal pain EXAM: CT ABDOMEN AND PELVIS WITH CONTRAST TECHNIQUE: Multidetector CT imaging of the abdomen and pelvis was performed using the standard protocol following  bolus administration of intravenous contrast. RADIATION DOSE REDUCTION: This exam was performed according to the departmental dose-optimization program which  includes automated exposure control, adjustment of the mA and/or kV according to patient size and/or use of iterative reconstruction technique. CONTRAST:  OMNIPAQUE IOHEXOL 300 MG/ML  SOLN COMPARISON:  03/06/2023 FINDINGS: Lower chest: No acute abnormality. Hepatobiliary: Liver is within normal limits. Gallbladder is distended with dependent gallstones stable from the prior exam. Previously seen common bile duct dilatation has resolved in the interval. Pancreas: Unremarkable. No pancreatic ductal dilatation or surrounding inflammatory changes. Spleen: Normal in size without focal abnormality. Adrenals/Urinary Tract: Adrenal glands are within normal limits. Kidneys demonstrate a normal enhancement pattern bilaterally. Normal excretion is noted bilaterally. No calculi are seen. The bladder is decompressed. Stomach/Bowel: Considerable retained fecal material is noted within the rectosigmoid increased when compared with the prior exam. Multiple ingested tablets are noted within the rectum. Changes consistent with prior right hemicolectomy are noted. Gaseous distension of the proximal colon is noted. Stomach and small bowel are within normal limits. Vascular/Lymphatic: Aortic atherosclerosis. No enlarged abdominal or pelvic lymph nodes. Reproductive: Status post hysterectomy. No adnexal masses. Other: No abdominal wall hernia or abnormality. No abdominopelvic ascites. Musculoskeletal: No acute or significant osseous findings. Postsurgical changes in the anterior abdominal wall are again noted. IMPRESSION: Colonic distension with air and fecal material as described. This may represent a colonic ileus. Cholelithiasis without complicating factors. Resolution of previously seen biliary ductal dilatation. Electronically Signed   By: Alcide Clever M.D.   On: 06/13/2023 22:14     Assessment and Plan: * Bowel obstruction (HCC) Questionably noted on CT scan.  There is marked dilation of the large and small bowel with  air-fluid levels. There is no nausea or vomiting.  Likely can avoid NG tube. Per GI bowel rest today, n.p.o. Consider additional agents in the morning to try to get her bowels moving.  Bile duct abnormality Noted on CT, previously assessed with EGD in November 24  Paroxysmal atrial fibrillation (HCC) Paroxysmal, appears to be in sinus rhythm at present.  Chronic heart failure with preserved ejection fraction (HFpEF) (HCC) No evidence of volume overload. Continue daily Lasix Continue beta-blocker  Hypokalemia Replete and trend  HTN (hypertension) Continue Lopressor      Advance Care Planning:   Code Status: Prior Full  Consults: GI who sent her and, Dr. Loreta Ave from [facility] will see the patient in the morning  Family Communication: Patient at bedside  Severity of Illness: The appropriate patient status for this patient is OBSERVATION. Observation status is judged to be reasonable and necessary in order to provide the required intensity of service to ensure the patient's safety. The patient's presenting symptoms, physical exam findings, and initial radiographic and laboratory data in the context of their medical condition is felt to place them at decreased risk for further clinical deterioration. Furthermore, it is anticipated that the patient will be medically stable for discharge from the hospital within 2 midnights of admission.   Author: Reva Bores, MD 07/03/2023 7:10 PM  For on call review [email or web address].

## 2023-07-03 NOTE — Assessment & Plan Note (Signed)
 Continue Lopressor

## 2023-07-03 NOTE — Hospital Course (Signed)
 78 year old female with history of PAF not on AC, chronic constipation and IBS on Linzess, history of prior small bowel sent to [facility] ED from her GI practice this morning due to marked abdominal distention and concern for bowel obstruction.  Patient denies having significant nausea or vomiting.  She reports leaking liquid stool for the past few days including overnight and when she is up ambulating.  Is not the first time this has happened to her.  We are asked to admit the patient for bowel rest.

## 2023-07-03 NOTE — ED Provider Notes (Signed)
 78 year old female presents the emergency department today with findings on CT scan concerning for bowel obstruction secondary to possible fecal impaction.  The patient had a rectal exam earlier today at her gastroenterologist office with reachable impaction.  A call was placed to gastroenterology to discuss further workup.  Physical Exam  BP (!) 145/76   Pulse 73   Temp 98.2 F (36.8 C)   Resp 18   SpO2 100%   Physical Exam  Procedures  Procedures  ED Course / MDM   Clinical Course as of 07/03/23 1539  Thu Jul 03, 2023  1535 Patient signed out to Dr. Rhae Hammock pending consult and callback from GI Dr Loreta Ave regarding colonic distension - recommendations to proceed [MT]    Clinical Course User Index [MT] Trifan, Kermit Balo, MD   Medical Decision Making Amount and/or Complexity of Data Reviewed Labs: ordered. Radiology: ordered.  Risk OTC drugs. Prescription drug management. Decision regarding hospitalization.   I spoke with Dr. Loreta Ave who recommends making the patient n.p.o. overnight and giving her maintenance fluids.  She recommends correcting her hypokalemia and they will see the patient in the morning.  If her symptoms are improving I will likely start a bowel regimen tomorrow.  She requested the patient be admitted to the hospitalist service.       Durwin Glaze, MD 07/03/23 [phone]

## 2023-07-03 NOTE — ED Notes (Signed)
 Patient transported to admitting floor with all belongings. All questions answered at this time.

## 2023-07-03 NOTE — ED Provider Notes (Signed)
 [place] [facility]PARTMENT AT [facility] Provider Note   CSN: [id number] Arrival date & time: 07/03/23  6295     History  Chief Complaint  Patient presents with   bowel obstruction   Abdominal Pain    Katherine Floyd is a 78 y.o. 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  Patient was seen in the ED approximately 3 weeks ago at that time for nausea vomiting and diarrhea.  She was diagnosed with a likely viral illness but did have a CT scan concerning for colonic ileus.  She had refused hospitalization at that time.  She reports that since then she has had only watery stool, no formed stool.  She is able to keep down food and fluids but has a diminished appetite.  She reports she has severe bloating of the abdomen throughout the day which causes a lot of pain.  She went to see her GI doctor, Dr Loreta Ave today, and was referred into the ED out of concern for a bowel obstruction.  Pt reports Dr Loreta Ave did rectal exam, was told she has rectal fissure (lots of pain with BM), but not able to be disimpacted in office.  HPI     Home Medications Prior to Admission medications   Medication Sig Start Date End Date Taking? Authorizing Provider  acetaminophen (TYLENOL) 500 MG tablet Take 500 mg by mouth every 6 (six) hours as needed for mild pain (pain score 1-3) or moderate pain (pain score 4-6).   Yes [provider]  albuterol (PROVENTIL) (2.5 MG/3ML) 0.083% nebulizer solution Take 2.5 mg by nebulization every 6 (six) hours as needed for wheezing or shortness of breath.    Yes [provider]  ALPRAZolam Prudy Feeler) 0.5 MG tablet Take 0.25 mg by mouth 2 (two) times daily as needed for anxiety. Anxiety 06/17/11  Yes [provider]  aspirin EC 81 MG tablet Take 1 tablet (81 mg total) by mouth daily. 04/18/15  Yes Lyn Records, MD  atorvastatin  (LIPITOR) 10 MG tablet Take 1 tablet (10 mg total) by mouth at bedtime. 02/13/23  Yes Weaver, Scott T, PA-C  B Complex Vitamins (VITAMIN B-COMPLEX) TABS Take 1 tablet by mouth at bedtime.   Yes [provider]  budesonide-formoterol (SYMBICORT) 160-4.5 MCG/ACT inhaler Inhale 1 puff into the lungs 2 (two) times daily. 07/09/16  Yes [provider]  Calcium Carbonate-Vitamin D (CALCIUM-CARB 600 + D PO) Take 1 tablet by mouth at bedtime.   Yes [provider]  carboxymethylcellulose (REFRESH PLUS) 0.5 % SOLN Place 1 drop into both eyes 2 (two) times daily as needed. Dry eyes   Yes [provider]  cetirizine (ZYRTEC) 10 MG tablet Take 10 mg by mouth at bedtime.   Yes [provider]  famotidine (PEPCID) 20 MG tablet Take 20 mg by mouth daily. 06/25/23 08/24/23 Yes [provider]  fluticasone (FLONASE) 50 MCG/ACT nasal spray Place 2 sprays into both nostrils 2 (two) times daily as needed for allergies.  05/22/16  Yes [provider]  furosemide (LASIX) 40 MG tablet Take 1 tablet (40 mg total) by mouth daily. Patient taking differently: Take 20 mg by mouth daily. 02/13/23  Yes Tereso Newcomer T, PA-C  hyoscyamine (LEVSIN) 0.125 MG tablet Take 0.125 mg by mouth every 6 (six) hours as needed for cramping. 06/25/23  Yes [provider]  LINZESS 145 MCG  CAPS capsule Take 145 mcg by mouth every morning.   Yes [provider]  Lutein 6 MG CAPS Take 6 mg by mouth daily.   Yes [provider]  montelukast (SINGULAIR) 10 MG tablet Take 10 mg by mouth at bedtime.  07/17/11  Yes [provider]  ondansetron (ZOFRAN-ODT) 4 MG disintegrating tablet Take 4 mg by mouth every 8 (eight) hours as needed for nausea, vomiting or refractory nausea / vomiting. 06/13/23  Yes [provider]  potassium chloride SA (KLOR-CON M) 20 MEQ tablet Take 1 tablet (20 mEq total) by mouth daily. 02/12/23  Yes Weaver, Scott T, PA-C  vitamin E  400 UNIT capsule Take 400 Units by mouth at bedtime.   Yes [provider]      Allergies    Elemental sulfur, Linaclotide, Prednisone, Dicyclomine, Doxycycline, and Penicillins    Review of Systems   Review of Systems  Physical Exam Updated Vital Signs BP (!) 145/76   Pulse 73   Temp 98.2 F (36.8 C)   Resp 18   SpO2 100%  Physical Exam Constitutional:      General: She is not in acute distress. HENT:     Head: Normocephalic and atraumatic.  Eyes:     Conjunctiva/sclera: Conjunctivae normal.     Pupils: Pupils are equal, round, and reactive to light.  Cardiovascular:     Rate and Rhythm: Normal rate and regular rhythm.  Pulmonary:     Effort: Pulmonary effort is normal. No respiratory distress.  Abdominal:     General: Abdomen is protuberant. There is distension.     Tenderness: There is generalized abdominal tenderness.  Skin:    General: Skin is warm and dry.  Neurological:     General: No focal deficit present.     Mental Status: She is alert. Mental status is at baseline.  Psychiatric:        Mood and Affect: Mood normal.        Behavior: Behavior normal.     ED Results / Procedures / Treatments   Labs (all labs ordered are listed, but only abnormal results are displayed) Labs Reviewed  LIPASE, BLOOD - Abnormal; Notable for the following components:      Result Value   Lipase 60 (*)    All other components within normal limits  COMPREHENSIVE METABOLIC PANEL - Abnormal; Notable for the following components:   Potassium 3.0 (*)    Glucose, Bld 101 (*)    Creatinine, Ser 1.08 (*)    Total Protein 6.2 (*)    GFR, Estimated 52 (*)    All other components within normal limits  URINALYSIS, ROUTINE W REFLEX MICROSCOPIC - Abnormal; Notable for the following components:   Color, Urine COLORLESS (*)    Specific Gravity, Urine 1.002 (*)    All other components within normal limits  CBC    EKG None  Radiology CT ABDOMEN PELVIS W CONTRAST Result  Date: 07/03/2023 CLINICAL DATA:  Bloating a watery stools for 2 weeks. Possible obstruction. EXAM: CT ABDOMEN AND PELVIS WITH CONTRAST TECHNIQUE: Multidetector CT imaging of the abdomen and pelvis was performed using the standard protocol following bolus administration of intravenous contrast. RADIATION DOSE REDUCTION: This exam was performed according to the departmental dose-optimization program which includes automated exposure control, adjustment of the mA and/or kV according to patient size and/or use of iterative reconstruction technique. CONTRAST:  75mL OMNIPAQUE IOHEXOL 350 MG/ML SOLN COMPARISON:  CT 06/13/2023. older CT MRI exams as well. FINDINGS:  Lower chest: There is some linear opacity seen at the left lower lobe greater than lingula and right lower lobe, areas of scar atelectatic change. Similar distribution to previous. The opacity left lower lobe appears increased. No significant pleural effusion. Coronary artery calcifications are seen. Small hiatal hernia. Hepatobiliary: Stones seen in the nondilated urinary bladder. There is some ectasia of the biliary tree which appears slightly more prominent today than previous. Normal tapering is not as gradual as expected. Slight intrahepatic biliary duct ectasia as well. No space-occupying liver lesion. Patent portal vein. Pancreas: Unremarkable. No pancreatic ductal dilatation or surrounding inflammatory changes. Spleen: Normal in size without focal abnormality. Adrenals/Urinary Tract: The adrenal glands are preserved. No enhancing renal mass or collecting system dilatation. The ureters have a normal course and caliber extending down to the urinary bladder. Preserved contour to the urinary bladder. Stomach/Bowel: No oral contrast. The stomach is nondilated. Duodenum is relatively collapsed as well. The colon is dilated insert lay areas with areas of mild wall/fold thickening. Some loops are dilated up to 10.5 cm such as along the rectum. There is a very  large amount of stool in the rectum as well as impacted stool suggested throughout the redundant sigmoid colon with few air-fluid levels. Also significant stool along the descending colon. There are stool fluid levels along the transverse colon which are dilated as well up to 9.8 cm. Surgical changes along the right side of the colon. In addition there is some dilated loops of small bowel mid abdomen with some air-fluid levels measuring up to 4.2 cm. Few areas of high attenuation debris along the colon which could be ingested contents. No areas of pneumatosis. No portal venous gas suggested. Vascular/Lymphatic: Diffuse vascular calcifications along the aorta and branch vessels. Normal caliber aorta and IVC. No discrete abnormal lymph node enlargement identified in the abdomen and pelvis. Reproductive: Status post hysterectomy. No adnexal masses. Other: Anasarca. Presumed mesh along the anterior abdominal wall for hernia repair. Few areas of free fluid particularly along the right side of the abdomen, adjacent to liver and pericolic gutter. Increased. Musculoskeletal: Osteopenia. Degenerative changes seen spine pelvis. Changes of previous right hemilaminectomy at L5. Multilevel disc bulging. Stable compression deformity of the superior endplate of L1 with Schmorl's node. Canal stenosis seen particularly at L4-5. IMPRESSION: Extensive colonic stool identified with dilatation of the colon measuring up to 10.5 cm. Several air-fluid levels as well in areas less impacted by stool. Please correlate for any evidence of a distal stenosis at the anorectal region with these changes and chronicity. Several mildly dilated loops of small bowel as well with air-fluid levels. This could be secondary to the colonic process but would recommend follow-up. Slight increase in trace ascites. Stones in the gallbladder. There is mild biliary duct dilatation which is developing from the prior examination. There is some tapering of the  distal common duct but not smoothly as expected. With the interval change, an ampullary lesion is difficult to completely exclude. Please correlate with any clinical presentation and prior workup. Additional evaluation as clinically appropriate Surgical changes identified along the right side of the colon. Spine surgery as well. Anterior abdominal wall mesh. Electronically Signed   By: Karen Kays M.D.   On: 07/03/2023 15:05    Procedures Procedures    Medications Ordered in ED Medications  potassium chloride 10 mEq in 100 mL IVPB (0 mEq Intravenous Stopped 07/03/23 1624)  lidocaine (XYLOCAINE) 2 % jelly 1 Application (has no administration in time range)  acetaminophen (TYLENOL)  tablet 650 mg (650 mg Oral Given 07/03/23 1357)  iohexol (OMNIPAQUE) 350 MG/ML injection 75 mL (75 mLs Intravenous Contrast Given 07/03/23 1319)    ED Course/ Medical Decision Making/ A&P Clinical Course as of 07/03/23 1636  Thu Jul 03, 2023  1535 Patient signed out to Dr. Rhae Hammock pending consult and callback from GI Dr Loreta Ave regarding colonic distension - recommendations to proceed [MT]    Clinical Course User Index [MT] Diem Pagnotta, Kermit Balo, MD                                 Medical Decision Making Amount and/or Complexity of Data Reviewed Labs: ordered. Radiology: ordered.  Risk OTC drugs. Prescription drug management.   This patient presents to the ED with concern for lower abdominal pain, bloating, distention. This involves an extensive number of treatment options, and is a complaint that carries with it a high risk of complications and morbidity.  The differential diagnosis includes bowel obstruction versus ileus versus perforation versus other  Co-morbidities that complicate the patient evaluation: History of prior abdominal surgery at high risk for adhesions and bowel obstruction  I ordered and personally interpreted labs.  The pertinent results include:  K 3.0.  Cr 1.08.  WBC and hgb wnl.  I ordered  imaging studies including CT abdomen pelvis I independently visualized and interpreted imaging which showed colonic distension, bowel gas pattern, fecal matter in colon noted I agree with the radiologist interpretation  The patient was maintained on a cardiac monitor.  I personally viewed and interpreted the cardiac monitored which showed an underlying rhythm of: NSR   I ordered medication including tylenol for pain, IV K for hypokalemia  I have reviewed the patients home medicines and have made adjustments as needed  After the interventions noted above, I reevaluated the patient and found that they have: stayed the same   Dispostion:  Patient is signed out to Dr Rhae Hammock pending GI consult (Dr Loreta Ave) regarding colonic impaction/ileus.  She is not having nausea/vomiting at this time.         Final Clinical Impression(s) / ED Diagnoses Final diagnoses:  Colon distention  Hypokalemia  Fecal impaction in rectum [facility])    Rx / DC Orders ED Discharge Orders     None         Terald Sleeper, MD 07/03/23 [phone]

## 2023-07-04 ENCOUNTER — Encounter (HOSPITAL_COMMUNITY): Payer: Self-pay | Admitting: Family Medicine

## 2023-07-04 ENCOUNTER — Observation Stay (HOSPITAL_COMMUNITY)

## 2023-07-04 DIAGNOSIS — E876 Hypokalemia: Secondary | ICD-10-CM

## 2023-07-04 DIAGNOSIS — K839 Disease of biliary tract, unspecified: Secondary | ICD-10-CM | POA: Diagnosis not present

## 2023-07-04 DIAGNOSIS — I5032 Chronic diastolic (congestive) heart failure: Secondary | ICD-10-CM

## 2023-07-04 DIAGNOSIS — I480 Paroxysmal atrial fibrillation: Secondary | ICD-10-CM | POA: Diagnosis not present

## 2023-07-04 DIAGNOSIS — I10 Essential (primary) hypertension: Secondary | ICD-10-CM

## 2023-07-04 DIAGNOSIS — K56609 Unspecified intestinal obstruction, unspecified as to partial versus complete obstruction: Secondary | ICD-10-CM

## 2023-07-04 LAB — CBC WITH DIFFERENTIAL/PLATELET
Abs Immature Granulocytes: 0.04 10*3/uL (ref 0.00–0.07)
Basophils Absolute: 0.1 10*3/uL (ref 0.0–0.1)
Basophils Relative: 1 %
Eosinophils Absolute: 0.1 10*3/uL (ref 0.0–0.5)
Eosinophils Relative: 2 %
HCT: 37.6 % (ref 36.0–46.0)
Hemoglobin: 12.5 g/dL (ref 12.0–15.0)
Immature Granulocytes: 1 %
Lymphocytes Relative: 22 %
Lymphs Abs: 1.7 10*3/uL (ref 0.7–4.0)
MCH: 30 pg (ref 26.0–34.0)
MCHC: 33.2 g/dL (ref 30.0–36.0)
MCV: 90.4 fL (ref 80.0–100.0)
Monocytes Absolute: 0.5 10*3/uL (ref 0.1–1.0)
Monocytes Relative: 7 %
Neutro Abs: 5.2 10*3/uL (ref 1.7–7.7)
Neutrophils Relative %: 67 %
Platelets: 243 10*3/uL (ref 150–400)
RBC: 4.16 MIL/uL (ref 3.87–5.11)
RDW: 14.4 % (ref 11.5–15.5)
WBC: 7.7 10*3/uL (ref 4.0–10.5)
nRBC: 0 % (ref 0.0–0.2)

## 2023-07-04 LAB — COMPREHENSIVE METABOLIC PANEL
ALT: 16 U/L (ref 0–44)
AST: 19 U/L (ref 15–41)
Albumin: 3.5 g/dL (ref 3.5–5.0)
Alkaline Phosphatase: 57 U/L (ref 38–126)
Anion gap: 15 (ref 5–15)
BUN: 9 mg/dL (ref 8–23)
CO2: 23 mmol/L (ref 22–32)
Calcium: 9.2 mg/dL (ref 8.9–10.3)
Chloride: 104 mmol/L (ref 98–111)
Creatinine, Ser: 1.1 mg/dL — ABNORMAL HIGH (ref 0.44–1.00)
GFR, Estimated: 51 mL/min — ABNORMAL LOW (ref >60)
Glucose, Bld: 80 mg/dL (ref 70–99)
Potassium: 3.5 mmol/L (ref 3.5–5.1)
Sodium: 142 mmol/L (ref 135–145)
Total Bilirubin: 1.3 mg/dL — ABNORMAL HIGH (ref 0.0–1.2)
Total Protein: 6.1 g/dL — ABNORMAL LOW (ref 6.5–8.1)

## 2023-07-04 MED ORDER — BENZOCAINE 20 % MT AERO: INHALATION_SPRAY | Freq: Three times a day (TID) | OROMUCOSAL | Status: DC | PRN

## 2023-07-04 NOTE — Progress Notes (Signed)
 Transition of Care [facility]) - Inpatient Brief Assessment   Patient Details  Name: Katherine Floyd MRN: [id number] Date of Birth: 21-Apr-1946  [facility] of [facility]) CM/SW Contact:    Janae Bridgeman, RN Phone Number: 07/04/2023, 2:55 PM   Clinical Narrative: Patient admitted to the hospital with bowel obstruction.  Patient plans to return home when medically stable for discharge.  Patient was provided with [facility] letter at the bedside.   Transition of Care Asessment: Insurance and Status: (P) Insurance coverage has been reviewed Patient has primary care physician: (P) Yes Home environment has been reviewed: (P) from home with spouse Prior level of function:: (P) Independent Prior/Current Home Services: (P) No current home services Social Drivers of Health Review: (P) SDOH reviewed needs interventions Readmission risk has been reviewed: (P) Yes Transition of care needs: (P) no transition of care needs at this time

## 2023-07-04 NOTE — Plan of Care (Signed)
 Patient AAOx4. PRN tylenol given for abdominal pain with + effect. Distended abd. Independent in room. NPO except meds. NGT placed and connected to LWS. Rectal tube ordered, awaiting one to be sent to unit. Safety precautions maintained.   Problem: Education: Goal: Knowledge of General Education information will improve Description: Including pain rating scale, medication(s)/side effects and non-pharmacologic comfort measures Outcome: Progressing   Problem: Nutrition: Goal: Adequate nutrition will be maintained Outcome: Progressing   Problem: Elimination: Goal: Will not experience complications related to bowel motility Outcome: Progressing   Problem: Pain Managment: Goal: General experience of comfort will improve and/or be controlled Outcome: Progressing

## 2023-07-04 NOTE — Consult Note (Signed)
 Reason for Consult: Colonic and small bowel ileus Referring Physician: [facility]  Burnett Harry HPI: This is a 78 year old female with a PMH of cholelithiasis, GERD, s/p right hemicolectomy for a colonic adenoma, and osteoporosis admitted for complaints of abdominal pain and distension.  She was recently in the ER on 06/14/2023 and a CT scan showed significant distension and retained fecal matter.  The patient wanted to be admitted, but she was instructed by the ER physician to continue with her Miralax and Linzess.  She was evaluated by Dr. Loreta Ave in the office yesterday and she was advised to present to the hospital for admission.  A repeat CT scan shows the same findings of a diffusely dilated colon and now there is small bowel distension.  Her admission K+ was at 3.0, but that was corrected up to 3.5 today.  She was on Linzess and Miralax as an outpatient without any significant benefit.  There was a report by Dr. Loreta Ave that she has an anal stenosis, but the patient does not recall any issues with stenosis.  She does report having a problem with an anal fissure and it is rather painful for her at this time.  The patient does not report any recent changes to her medications and she did have problems with ileus in November last year, but it did resolve one its own and remained normal for one month.  As a result of the recurrent ileus her PO intake worsened and she lost a significant amount of weight.  Past Medical History:  Diagnosis Date   Anxiety    Arthritis    with fracture right great toe 08/30/11 from "stepping wrong"   Asthma    Dysrhythmia 06/21/2013   NEW ONSET ATRIAL FIBRILATION/RVR   GERD (gastroesophageal reflux disease)    Hypotension    Kidney stone    Osteoporosis    Polyp of colon, adenomatous    recurrent   Urinary tract infection 08/29/11   Cipro per PCP- states is resolving    Past Surgical History:  Procedure Laterality Date   ABDOMINAL HYSTERECTOMY  1984   BACK  SURGERY  1997-1998   fell on ice and snow   BREAST LUMPECTOMY WITH RADIOACTIVE SEED LOCALIZATION Left 08/06/2019   Procedure: LEFT BREAST LUMPECTOMY WITH RADIOACTIVE SEED LOCALIZATION;  Surgeon: Griselda Miner, MD;  Location: [facility];  Service: General;  Laterality: Left;   BUNIONECTOMY  2002   right foot   ESOPHAGOGASTRODUODENOSCOPY (EGD) WITH PROPOFOL N/A 03/10/2023   Procedure: ESOPHAGOGASTRODUODENOSCOPY (EGD) WITH PROPOFOL;  Surgeon: Jeani Hawking, MD;  Location: [facility] END[facility];  Service: Gastroenterology;  Laterality: N/A;   EYE SURGERY  2000   cataract extraction with IOL   HAND SURGERY  1994   cyst removed   HEMICOLECTOMY  09/06/11   INCISIONAL HERNIA REPAIR N/A 01/31/2017   Procedure: LAPAROSCOPIC INCISIONAL HERNIA REPAIR WITH MESH;  Surgeon: Avel Peace, MD;  Location: [facility];  Service: General;  Laterality: N/A;   INSERTION OF MESH N/A 01/31/2017   Procedure: INSERTION OF MESH;  Surgeon: Avel Peace, MD;  Location: [facility];  Service: General;  Laterality: N/A;   KNEE SURGERY  1990   POLYPECTOMY     SHOULDER SURGERY  2007   right    TONSILLECTOMY  age of 74   UPPER ESOPHAGEAL ENDOSCOPIC ULTRASOUND (EUS)  03/10/2023   Procedure: UPPER ESOPHAGEAL ENDOSCOPIC ULTRASOUND (EUS);  Surgeon: Jeani Hawking, MD;  Location: [facility] ENDOSCOPY;  Service: Gastroenterology;;   uretheral dilitation  Family History  Problem Relation Age of Onset   Cancer Mother        breast   Cancer Cousin        breast   Cancer Maternal Uncle        colon    Social History:  reports that she quit smoking about 34 years ago. Her smoking use included cigarettes. She started smoking about 44 years ago. She has a 15 pack-year smoking history. She has never used smokeless tobacco. She reports that she does not drink alcohol and does not use drugs.  Allergies:  Allergies  Allergen Reactions   Elemental Sulfur Itching   Linaclotide Other (See Comments)    Other reaction(s): Other (See  Comments)  Diarrhea   Patient dose not recognize   Prednisone Other (See Comments)    Patient states that 20 mgs of prednisone feel loopy.   Dicyclomine Rash   Doxycycline Rash   Penicillins Rash    Medications: Scheduled:  atorvastatin  10 mg Oral QHS   enoxaparin (LOVENOX) injection  40 mg Subcutaneous Q24H   famotidine  20 mg Oral Daily   furosemide  40 mg Oral Daily   lidocaine  1 Application Topical Once   linaclotide  145 mcg Oral q morning   loratadine  10 mg Oral Daily   mometasone-formoterol  2 puff Inhalation BID   montelukast  10 mg Oral QHS   potassium chloride  40 mEq Oral BID   Continuous:  lactated ringers 40 mL/hr at 07/04/23 1503    Results for orders placed or performed during the hospital encounter of 07/03/23 (from the past 24 hours)  Comprehensive metabolic panel     Status: Abnormal   Collection Time: 07/04/23  9:55 AM  Result Value Ref Range   Sodium 142 135 - 145 mmol/L   Potassium 3.5 3.5 - 5.1 mmol/L   Chloride 104 98 - 111 mmol/L   CO2 23 22 - 32 mmol/L   Glucose, Bld 80 70 - 99 mg/dL   BUN 9 8 - 23 mg/dL   Creatinine, Ser 3.55 (H) 0.44 - 1.00 mg/dL   Calcium 9.2 8.9 - 73.2 mg/dL   Total Protein 6.1 (L) 6.5 - 8.1 g/dL   Albumin 3.5 3.5 - 5.0 g/dL   AST 19 15 - 41 U/L   ALT 16 0 - 44 U/L   Alkaline Phosphatase 57 38 - 126 U/L   Total Bilirubin 1.3 (H) 0.0 - 1.2 mg/dL   GFR, Estimated 51 (L) >60 mL/min   Anion gap 15 5 - 15  CBC with Differential/Platelet     Status: None   Collection Time: 07/04/23  9:55 AM  Result Value Ref Range   WBC 7.7 4.0 - 10.5 K/uL   RBC 4.16 3.87 - 5.11 MIL/uL   Hemoglobin 12.5 12.0 - 15.0 g/dL   HCT 20.2 54.2 - 70.6 %   MCV 90.4 80.0 - 100.0 fL   MCH 30.0 26.0 - 34.0 pg   MCHC 33.2 30.0 - 36.0 g/dL   RDW 23.7 62.8 - 31.5 %   Platelets 243 150 - 400 K/uL   nRBC 0.0 0.0 - 0.2 %   Neutrophils Relative % 67 %   Neutro Abs 5.2 1.7 - 7.7 K/uL   Lymphocytes Relative 22 %   Lymphs Abs 1.7 0.7 - 4.0 K/uL    Monocytes Relative 7 %   Monocytes Absolute 0.5 0.1 - 1.0 K/uL   Eosinophils Relative 2 %   Eosinophils  Absolute 0.1 0.0 - 0.5 K/uL   Basophils Relative 1 %   Basophils Absolute 0.1 0.0 - 0.1 K/uL   Immature Granulocytes 1 %   Abs Immature Granulocytes 0.04 0.00 - 0.07 K/uL     CT ABDOMEN PELVIS W CONTRAST Result Date: 07/03/2023 CLINICAL DATA:  Bloating a watery stools for 2 weeks. Possible obstruction. EXAM: CT ABDOMEN AND PELVIS WITH CONTRAST TECHNIQUE: Multidetector CT imaging of the abdomen and pelvis was performed using the standard protocol following bolus administration of intravenous contrast. RADIATION DOSE REDUCTION: This exam was performed according to the departmental dose-optimization program which includes automated exposure control, adjustment of the mA and/or kV according to patient size and/or use of iterative reconstruction technique. CONTRAST:  75mL OMNIPAQUE IOHEXOL 350 MG/ML SOLN COMPARISON:  CT 06/13/2023. older CT MRI exams as well. FINDINGS: Lower chest: There is some linear opacity seen at the left lower lobe greater than lingula and right lower lobe, areas of scar atelectatic change. Similar distribution to previous. The opacity left lower lobe appears increased. No significant pleural effusion. Coronary artery calcifications are seen. Small hiatal hernia. Hepatobiliary: Stones seen in the nondilated urinary bladder. There is some ectasia of the biliary tree which appears slightly more prominent today than previous. Normal tapering is not as gradual as expected. Slight intrahepatic biliary duct ectasia as well. No space-occupying liver lesion. Patent portal vein. Pancreas: Unremarkable. No pancreatic ductal dilatation or surrounding inflammatory changes. Spleen: Normal in size without focal abnormality. Adrenals/Urinary Tract: The adrenal glands are preserved. No enhancing renal mass or collecting system dilatation. The ureters have a normal course and caliber extending down to  the urinary bladder. Preserved contour to the urinary bladder. Stomach/Bowel: No oral contrast. The stomach is nondilated. Duodenum is relatively collapsed as well. The colon is dilated insert lay areas with areas of mild wall/fold thickening. Some loops are dilated up to 10.5 cm such as along the rectum. There is a very large amount of stool in the rectum as well as impacted stool suggested throughout the redundant sigmoid colon with few air-fluid levels. Also significant stool along the descending colon. There are stool fluid levels along the transverse colon which are dilated as well up to 9.8 cm. Surgical changes along the right side of the colon. In addition there is some dilated loops of small bowel mid abdomen with some air-fluid levels measuring up to 4.2 cm. Few areas of high attenuation debris along the colon which could be ingested contents. No areas of pneumatosis. No portal venous gas suggested. Vascular/Lymphatic: Diffuse vascular calcifications along the aorta and branch vessels. Normal caliber aorta and IVC. No discrete abnormal lymph node enlargement identified in the abdomen and pelvis. Reproductive: Status post hysterectomy. No adnexal masses. Other: Anasarca. Presumed mesh along the anterior abdominal wall for hernia repair. Few areas of free fluid particularly along the right side of the abdomen, adjacent to liver and pericolic gutter. Increased. Musculoskeletal: Osteopenia. Degenerative changes seen spine pelvis. Changes of previous right hemilaminectomy at L5. Multilevel disc bulging. Stable compression deformity of the superior endplate of L1 with Schmorl's node. Canal stenosis seen particularly at L4-5. IMPRESSION: Extensive colonic stool identified with dilatation of the colon measuring up to 10.5 cm. Several air-fluid levels as well in areas less impacted by stool. Please correlate for any evidence of a distal stenosis at the anorectal region with these changes and chronicity. Several  mildly dilated loops of small bowel as well with air-fluid levels. This could be secondary to the colonic process but  would recommend follow-up. Slight increase in trace ascites. Stones in the gallbladder. There is mild biliary duct dilatation which is developing from the prior examination. There is some tapering of the distal common duct but not smoothly as expected. With the interval change, an ampullary lesion is difficult to completely exclude. Please correlate with any clinical presentation and prior workup. Additional evaluation as clinically appropriate Surgical changes identified along the right side of the colon. Spine surgery as well. Anterior abdominal wall mesh. Electronically Signed   By: Karen Kays M.D.   On: 07/03/2023 15:05    ROS:  As stated above in the HPI otherwise negative.  Blood pressure 113/66, pulse 88, temperature 98 F (36.7 C), resp. rate 18, height 4\' 11"  (1.499 m), weight 44 kg, SpO2 99%.    PE: Gen: NAD, Alert and Oriented HEENT:  [place]/AT, EOMI Lungs: CTA Bilaterally CV: RRR without M/G/R ABD: Soft, distended, hypoactive bowel sounds, tympanic, no acute abdomen Ext: No C/C/E  Assessment/Plan: 1) Colonic and small bowel ileus. 2) Weight loss secondary to #1. 3) Long history of tobacco use. 4) Anal fissure.   The colon was measured to be 10.5 cm.  If it is >12 cm then more aggressive measures will be required, ie endoscopic decompression.  Her CT scan does not show any pulmonary lesions to suggest a malignancy.  Small cell lung cancer can cause a paraneoplastic syndrome the presents as pseudo-obstruction.  Plan: 1) NG tube. 2) Serial KUB. 3) Rotate in bed every 15 minutes from left, right, and prone. 4) If after 72 hours there is no benefit with conservative measures, endoscopic decompression will be required.  The patient has a history of asthma and this precludes the use of neostigmine. 5) Follow and correct any electrolyte abnormalities. 6) ? Trial of a  rectal tube to assist in the decompression. 7) [facility] will round this weekend.  Jessejames Steelman D 07/04/2023, 4:04 PM

## 2023-07-04 NOTE — Progress Notes (Signed)
 PROGRESS NOTE    Katherine Floyd  YQM:[id number] DOB: 27-Nov-1945 DOA: 07/03/2023 PCP: Richmond Campbell., PA-C  Chief Complaint  Patient presents with   bowel obstruction   Abdominal Pain    Hospital Course:  Katherine Floyd is 78 y.o. female with paroxysmal A-fib not on anticoagulation, chronic constipation/irritable bowel syndrome on Linzess, history of prior small bowel obstruction, who was sent to [facility], ED from prior GI practice this morning due to abdominal distention and concern for SBO.  Patient denies any nausea vomiting.  She does report leaking liquid stool for the past few days.  Patient was admitted for bowel rest  Subjective: No acute events overnight.  Patient is still in significant amount of pain.  She does report she had 1 small bowel movement this morning.  No significant flatus.    Objective: Vitals:   07/03/23 2112 07/04/23 0426 07/04/23 0448 07/04/23 0729  BP: 131/82 127/85 131/75 111/81  Pulse: 82 84 79 89  Resp: 18 18 18 18   Temp: 98.9 F (37.2 C) 98.2 F (36.8 C) 98.1 F (36.7 C) 98 F (36.7 C)  TempSrc: Oral Oral Oral   SpO2: 99% 99% 98% 99%  Height: 4\' 11"  (1.499 m)      No intake or output data in the 24 hours ending 07/04/23 0838 There were no vitals filed for this visit.  Examination: General exam: Appears calm and appears uncomfortable. Respiratory system: No work of breathing, symmetric chest wall expansion Cardiovascular system: S1 & S2 heard, RRR.  Gastrointestinal system: Abdomen is distended and diffusely tender to palpation, high pitched bowel sounds. Neuro: Alert and oriented. No focal neurological deficits. Extremities: Symmetric, expected ROM Skin: No rashes, lesions Psychiatry: Demonstrates appropriate judgement and insight. Mood & affect appropriate for situation.   Assessment & Plan:  Principal Problem:   Bowel obstruction (HCC) Active Problems:   Bile duct abnormality   Chronic heart failure with preserved ejection  fraction (HFpEF) (HCC)   Paroxysmal atrial fibrillation (HCC)   HTN (hypertension)   Hypokalemia    Small bowel obstruction - CT notes extensive colonic stool with dilation of colon up to 10.5 cm, several air-fluid levels less impacted. - Given profound distention and high-pitched bowel sounds on exam today.  Will proceed with NG tube. -- Keep NPO.  - Have reached out to gastroenterology again, appreciate their recommendations.  Ascites - Trace ascites incidentally seen on CT  Cholelithiasis - Seen on CT, mild biliary duct dilation and some tapering of the distal common duct.  Cannot entirely exclude ampullary lesion. - This was noted on prior CT and assessed with EGD in November 2024 - Pancreas is unremarkable - LFTs: Within normal limits  Paroxysmal A-fib - Sinus rhythm on arrival - Continue home meds  Chronic heart failure with preserved EF - Clinically euvolemic - Continue home meds  Hypertension - Continue home meds  Hypokalemia - Replace as needed   DVT prophylaxis: Lovenox   Code Status: Full Code Family Communication:  Discussed directly with patient and husband at bedside Disposition:  Inpatient, still hospitalized for SBO management, will discharge to home eventually when medically stable.  Consultants:  Treatment Team:  Consulting Physician: Jeani Hawking, MD  Procedures:    Antimicrobials:  Anti-infectives (From admission, onward)    None       Data Reviewed: I have personally reviewed following labs and imaging studies CBC: Recent Labs  Lab 07/03/23 1030  WBC 7.8  HGB 12.2  HCT 36.6  MCV 89.3  PLT 264   Basic Metabolic Panel: Recent Labs  Lab 07/03/23 1030  NA 139  K 3.0*  CL 100  CO2 30  GLUCOSE 101*  BUN 9  CREATININE 1.08*  CALCIUM 9.3   GFR: CrCl cannot be calculated (Unknown ideal weight.). Liver Function Tests: Recent Labs  Lab 07/03/23 1030  AST 22  ALT 18  ALKPHOS 55  BILITOT 0.7  PROT 6.2*  ALBUMIN 3.6    CBG: No results for input(s): "GLUCAP" in the last 168 hours.  No results found for this or any previous visit (from the past 240 hours).   Radiology Studies: CT ABDOMEN PELVIS W CONTRAST Result Date: 07/03/2023 CLINICAL DATA:  Bloating a watery stools for 2 weeks. Possible obstruction. EXAM: CT ABDOMEN AND PELVIS WITH CONTRAST TECHNIQUE: Multidetector CT imaging of the abdomen and pelvis was performed using the standard protocol following bolus administration of intravenous contrast. RADIATION DOSE REDUCTION: This exam was performed according to the departmental dose-optimization program which includes automated exposure control, adjustment of the mA and/or kV according to patient size and/or use of iterative reconstruction technique. CONTRAST:  75mL OMNIPAQUE IOHEXOL 350 MG/ML SOLN COMPARISON:  CT 06/13/2023. older CT MRI exams as well. FINDINGS: Lower chest: There is some linear opacity seen at the left lower lobe greater than lingula and right lower lobe, areas of scar atelectatic change. Similar distribution to previous. The opacity left lower lobe appears increased. No significant pleural effusion. Coronary artery calcifications are seen. Small hiatal hernia. Hepatobiliary: Stones seen in the nondilated urinary bladder. There is some ectasia of the biliary tree which appears slightly more prominent today than previous. Normal tapering is not as gradual as expected. Slight intrahepatic biliary duct ectasia as well. No space-occupying liver lesion. Patent portal vein. Pancreas: Unremarkable. No pancreatic ductal dilatation or surrounding inflammatory changes. Spleen: Normal in size without focal abnormality. Adrenals/Urinary Tract: The adrenal glands are preserved. No enhancing renal mass or collecting system dilatation. The ureters have a normal course and caliber extending down to the urinary bladder. Preserved contour to the urinary bladder. Stomach/Bowel: No oral contrast. The stomach is  nondilated. Duodenum is relatively collapsed as well. The colon is dilated insert lay areas with areas of mild wall/fold thickening. Some loops are dilated up to 10.5 cm such as along the rectum. There is a very large amount of stool in the rectum as well as impacted stool suggested throughout the redundant sigmoid colon with few air-fluid levels. Also significant stool along the descending colon. There are stool fluid levels along the transverse colon which are dilated as well up to 9.8 cm. Surgical changes along the right side of the colon. In addition there is some dilated loops of small bowel mid abdomen with some air-fluid levels measuring up to 4.2 cm. Few areas of high attenuation debris along the colon which could be ingested contents. No areas of pneumatosis. No portal venous gas suggested. Vascular/Lymphatic: Diffuse vascular calcifications along the aorta and branch vessels. Normal caliber aorta and IVC. No discrete abnormal lymph node enlargement identified in the abdomen and pelvis. Reproductive: Status post hysterectomy. No adnexal masses. Other: Anasarca. Presumed mesh along the anterior abdominal wall for hernia repair. Few areas of free fluid particularly along the right side of the abdomen, adjacent to liver and pericolic gutter. Increased. Musculoskeletal: Osteopenia. Degenerative changes seen spine pelvis. Changes of previous right hemilaminectomy at L5. Multilevel disc bulging. Stable compression deformity of the superior endplate of L1 with Schmorl's node. Canal stenosis seen particularly at L4-5.  IMPRESSION: Extensive colonic stool identified with dilatation of the colon measuring up to 10.5 cm. Several air-fluid levels as well in areas less impacted by stool. Please correlate for any evidence of a distal stenosis at the anorectal region with these changes and chronicity. Several mildly dilated loops of small bowel as well with air-fluid levels. This could be secondary to the colonic process  but would recommend follow-up. Slight increase in trace ascites. Stones in the gallbladder. There is mild biliary duct dilatation which is developing from the prior examination. There is some tapering of the distal common duct but not smoothly as expected. With the interval change, an ampullary lesion is difficult to completely exclude. Please correlate with any clinical presentation and prior workup. Additional evaluation as clinically appropriate Surgical changes identified along the right side of the colon. Spine surgery as well. Anterior abdominal wall mesh. Electronically Signed   By: Karen Kays M.D.   On: 07/03/2023 15:05    Scheduled Meds:  atorvastatin  10 mg Oral QHS   enoxaparin (LOVENOX) injection  40 mg Subcutaneous Q24H   famotidine  20 mg Oral Daily   furosemide  40 mg Oral Daily   lidocaine  1 Application Topical Once   linaclotide  145 mcg Oral q morning   loratadine  10 mg Oral Daily   mometasone-formoterol  2 puff Inhalation BID   montelukast  10 mg Oral QHS   potassium chloride  40 mEq Oral BID   Continuous Infusions:  lactated ringers 40 mL/hr at 07/03/23 2112     LOS: 0 days   Total time spent interpreting labs and vitals, coordinating care amongst consultants and care team members, directly assessing and discussing care with the patient and/or family: 45 min   Debarah Crape, DO [facility]  To contact the attending physician between 7A-7P please use Epic Chat. To contact the covering physician during after hours 7P-7A, please review Amion.   07/04/2023, 8:38 AM   *This document has been created with the assistance of dictation software. Please excuse typographical errors. *

## 2023-07-04 NOTE — Plan of Care (Signed)

## 2023-07-04 NOTE — Care Management Obs Status (Cosign Needed)
 MEDICARE OBSERVATION STATUS NOTIFICATION   Patient Details  Name: Katherine Floyd MRN: [id number] Date of Birth: 1946-03-06   Medicare Observation Status Notification Given:  Yes    Janae Bridgeman, RN 07/04/2023, 2:49 PM

## 2023-07-05 ENCOUNTER — Observation Stay (HOSPITAL_COMMUNITY)

## 2023-07-05 DIAGNOSIS — K602 Anal fissure, unspecified: Secondary | ICD-10-CM | POA: Diagnosis present

## 2023-07-05 DIAGNOSIS — K6389 Other specified diseases of intestine: Principal | ICD-10-CM

## 2023-07-05 DIAGNOSIS — Z66 Do not resuscitate: Secondary | ICD-10-CM | POA: Diagnosis not present

## 2023-07-05 DIAGNOSIS — J690 Pneumonitis due to inhalation of food and vomit: Secondary | ICD-10-CM | POA: Diagnosis not present

## 2023-07-05 DIAGNOSIS — K56609 Unspecified intestinal obstruction, unspecified as to partial versus complete obstruction: Secondary | ICD-10-CM | POA: Diagnosis present

## 2023-07-05 DIAGNOSIS — K5641 Fecal impaction: Secondary | ICD-10-CM | POA: Diagnosis present

## 2023-07-05 DIAGNOSIS — K567 Ileus, unspecified: Secondary | ICD-10-CM | POA: Diagnosis not present

## 2023-07-05 DIAGNOSIS — I480 Paroxysmal atrial fibrillation: Secondary | ICD-10-CM | POA: Diagnosis present

## 2023-07-05 DIAGNOSIS — K56691 Other complete intestinal obstruction: Secondary | ICD-10-CM | POA: Diagnosis present

## 2023-07-05 DIAGNOSIS — K5900 Constipation, unspecified: Secondary | ICD-10-CM | POA: Diagnosis not present

## 2023-07-05 DIAGNOSIS — I110 Hypertensive heart disease with heart failure: Secondary | ICD-10-CM | POA: Diagnosis present

## 2023-07-05 DIAGNOSIS — J9691 Respiratory failure, unspecified with hypoxia: Secondary | ICD-10-CM | POA: Diagnosis not present

## 2023-07-05 DIAGNOSIS — K624 Stenosis of anus and rectum: Secondary | ICD-10-CM | POA: Diagnosis present

## 2023-07-05 DIAGNOSIS — I5032 Chronic diastolic (congestive) heart failure: Secondary | ICD-10-CM | POA: Diagnosis present

## 2023-07-05 DIAGNOSIS — E872 Acidosis, unspecified: Secondary | ICD-10-CM | POA: Diagnosis not present

## 2023-07-05 DIAGNOSIS — J45909 Unspecified asthma, uncomplicated: Secondary | ICD-10-CM | POA: Diagnosis present

## 2023-07-05 DIAGNOSIS — Z681 Body mass index (BMI) 19 or less, adult: Secondary | ICD-10-CM | POA: Diagnosis not present

## 2023-07-05 DIAGNOSIS — Z515 Encounter for palliative care: Secondary | ICD-10-CM | POA: Diagnosis not present

## 2023-07-05 DIAGNOSIS — K5939 Other megacolon: Secondary | ICD-10-CM | POA: Diagnosis present

## 2023-07-05 DIAGNOSIS — W449XXA Unspecified foreign body entering into or through a natural orifice, initial encounter: Secondary | ICD-10-CM | POA: Diagnosis present

## 2023-07-05 DIAGNOSIS — R140 Abdominal distension (gaseous): Secondary | ICD-10-CM | POA: Diagnosis not present

## 2023-07-05 DIAGNOSIS — R188 Other ascites: Secondary | ICD-10-CM | POA: Diagnosis present

## 2023-07-05 DIAGNOSIS — K219 Gastro-esophageal reflux disease without esophagitis: Secondary | ICD-10-CM | POA: Diagnosis present

## 2023-07-05 DIAGNOSIS — R109 Unspecified abdominal pain: Secondary | ICD-10-CM | POA: Diagnosis present

## 2023-07-05 DIAGNOSIS — K5981 Ogilvie syndrome: Secondary | ICD-10-CM | POA: Diagnosis present

## 2023-07-05 DIAGNOSIS — M810 Age-related osteoporosis without current pathological fracture: Secondary | ICD-10-CM | POA: Diagnosis present

## 2023-07-05 DIAGNOSIS — E8729 Other acidosis: Secondary | ICD-10-CM | POA: Diagnosis not present

## 2023-07-05 DIAGNOSIS — R739 Hyperglycemia, unspecified: Secondary | ICD-10-CM | POA: Diagnosis present

## 2023-07-05 DIAGNOSIS — E876 Hypokalemia: Secondary | ICD-10-CM | POA: Diagnosis present

## 2023-07-05 DIAGNOSIS — T184XXA Foreign body in colon, initial encounter: Secondary | ICD-10-CM | POA: Diagnosis present

## 2023-07-05 DIAGNOSIS — N179 Acute kidney failure, unspecified: Secondary | ICD-10-CM | POA: Diagnosis not present

## 2023-07-05 DIAGNOSIS — E46 Unspecified protein-calorie malnutrition: Secondary | ICD-10-CM | POA: Diagnosis present

## 2023-07-05 DIAGNOSIS — K55032 Diffuse acute (reversible) ischemia of large intestine: Secondary | ICD-10-CM | POA: Diagnosis not present

## 2023-07-05 LAB — GLUCOSE, CAPILLARY: Glucose-Capillary: 277 mg/dL — ABNORMAL HIGH (ref 70–99)

## 2023-07-05 LAB — CBC WITH DIFFERENTIAL/PLATELET
Abs Immature Granulocytes: 0.04 10*3/uL (ref 0.00–0.07)
Basophils Absolute: 0 10*3/uL (ref 0.0–0.1)
Basophils Relative: 0 %
Eosinophils Absolute: 0 10*3/uL (ref 0.0–0.5)
Eosinophils Relative: 0 %
HCT: 38.5 % (ref 36.0–46.0)
Hemoglobin: 12.9 g/dL (ref 12.0–15.0)
Immature Granulocytes: 0 %
Lymphocytes Relative: 17 %
Lymphs Abs: 1.6 10*3/uL (ref 0.7–4.0)
MCH: 30 pg (ref 26.0–34.0)
MCHC: 33.5 g/dL (ref 30.0–36.0)
MCV: 89.5 fL (ref 80.0–100.0)
Monocytes Absolute: 0.7 10*3/uL (ref 0.1–1.0)
Monocytes Relative: 7 %
Neutro Abs: 7.5 10*3/uL (ref 1.7–7.7)
Neutrophils Relative %: 76 %
Platelets: 275 10*3/uL (ref 150–400)
RBC: 4.3 MIL/uL (ref 3.87–5.11)
RDW: 14.6 % (ref 11.5–15.5)
WBC: 9.8 10*3/uL (ref 4.0–10.5)
nRBC: 0 % (ref 0.0–0.2)

## 2023-07-05 LAB — COMPREHENSIVE METABOLIC PANEL
ALT: 16 U/L (ref 0–44)
AST: 19 U/L (ref 15–41)
Albumin: 3.3 g/dL — ABNORMAL LOW (ref 3.5–5.0)
Alkaline Phosphatase: 54 U/L (ref 38–126)
Anion gap: 20 — ABNORMAL HIGH (ref 5–15)
BUN: 15 mg/dL (ref 8–23)
CO2: 21 mmol/L — ABNORMAL LOW (ref 22–32)
Calcium: 9.4 mg/dL (ref 8.9–10.3)
Chloride: 106 mmol/L (ref 98–111)
Creatinine, Ser: 1.45 mg/dL — ABNORMAL HIGH (ref 0.44–1.00)
GFR, Estimated: 37 mL/min — ABNORMAL LOW (ref >60)
Glucose, Bld: 81 mg/dL (ref 70–99)
Potassium: 3.8 mmol/L (ref 3.5–5.1)
Sodium: 147 mmol/L — ABNORMAL HIGH (ref 135–145)
Total Bilirubin: 2.1 mg/dL — ABNORMAL HIGH (ref 0.0–1.2)
Total Protein: 5.9 g/dL — ABNORMAL LOW (ref 6.5–8.1)

## 2023-07-05 LAB — PHOSPHORUS: Phosphorus: 4.4 mg/dL (ref 2.5–4.6)

## 2023-07-05 LAB — MAGNESIUM: Magnesium: 2.1 mg/dL (ref 1.7–2.4)

## 2023-07-05 MED ORDER — ENOXAPARIN SODIUM 30 MG/0.3ML IJ SOSY
30.0000 mg | PREFILLED_SYRINGE | INTRAMUSCULAR | Status: DC
  Administered 2023-07-05: 30 mg via SUBCUTANEOUS
  Filled 2023-07-05: qty 0.3

## 2023-07-05 MED ORDER — SMOG ENEMA
200.0000 mL | Freq: Once | RECTAL | Status: AC
Start: 2023-07-05 — End: 2023-07-05
  Administered 2023-07-05: 200 mL via RECTAL
  Filled 2023-07-05: qty 960

## 2023-07-05 MED ORDER — SMOG ENEMA
400.0000 mL | Freq: Once | RECTAL | Status: DC
Start: 2023-07-06 — End: 2023-07-06
  Filled 2023-07-05: qty 960

## 2023-07-05 MED ORDER — LIDOCAINE HCL URETHRAL/MUCOSAL 2 % EX GEL
1.0000 | CUTANEOUS | Status: DC | PRN
  Administered 2023-07-05: 1 via TOPICAL
  Filled 2023-07-05 (×2): qty 6

## 2023-07-05 MED ORDER — SODIUM CHLORIDE 0.9 % IV SOLN: INTRAVENOUS | Status: DC

## 2023-07-05 NOTE — Plan of Care (Signed)

## 2023-07-05 NOTE — Progress Notes (Signed)
 Progress Note   Subjective  Patient pulled NG out this AM. States she did have 2 stools overnight and passed some gas and felt somewhat better. Currently sitting on the toilet trying to pass more. Labs drawn but not back yet this AM   Objective   Vital signs in last 24 hours: Temp:  [98 F (36.7 C)-98.6 F (37 C)] 98.6 F (37 C) (03/08 0507) Pulse Rate:  [85-95] 95 (03/08 0507) Resp:  [18] 18 (03/08 0507) BP: (113-147)/(66-87) 141/87 (03/08 0507) SpO2:  [98 %-99 %] 99 % (03/08 0746) Weight:  [44 kg] 44 kg (03/07 1409) Last BM Date : 07/04/23 General:    white female in NAD Neurologic:  Alert and oriented,  grossly normal neurologically. Psych:  Cooperative. Normal mood and affect.  Sitting on toilet - did not examine abdomen at this time  Intake/Output from previous day: 03/07 0701 - 03/08 0700 In: 703.3 [I.V.:703.3] Out: -  Intake/Output this shift: No intake/output data recorded.  Lab Results: Recent Labs    07/03/23 1030 07/04/23 0955  WBC 7.8 7.7  HGB 12.2 12.5  HCT 36.6 37.6  PLT 264 243   BMET Recent Labs    07/03/23 1030 07/04/23 0955  NA 139 142  K 3.0* 3.5  CL 100 104  CO2 30 23  GLUCOSE 101* 80  BUN 9 9  CREATININE 1.08* 1.10*  CALCIUM 9.3 9.2   LFT Recent Labs    07/04/23 0955  PROT 6.1*  ALBUMIN 3.5  AST 19  ALT 16  ALKPHOS 57  BILITOT 1.3*   PT/INR No results for input(s): "LABPROT", "INR" in the last 72 hours.  Studies/Results: DG Abd Portable 1V Result Date: 07/04/2023 CLINICAL DATA:  Feeding tube placement. EXAM: PORTABLE ABDOMEN - 1 VIEW COMPARISON:  CT yesterday FINDINGS: Tip and side port of the enteric tube below the diaphragm in the stomach. There is diffuse bowel distension similar to yesterday's CT. IMPRESSION: Tip and side port of the enteric tube below the diaphragm in the stomach. Electronically Signed   By: Narda Rutherford M.D.   On: 07/04/2023 18:14   CT ABDOMEN PELVIS W CONTRAST Result Date:  07/03/2023 CLINICAL DATA:  Bloating a watery stools for 2 weeks. Possible obstruction. EXAM: CT ABDOMEN AND PELVIS WITH CONTRAST TECHNIQUE: Multidetector CT imaging of the abdomen and pelvis was performed using the standard protocol following bolus administration of intravenous contrast. RADIATION DOSE REDUCTION: This exam was performed according to the departmental dose-optimization program which includes automated exposure control, adjustment of the mA and/or kV according to patient size and/or use of iterative reconstruction technique. CONTRAST:  75mL OMNIPAQUE IOHEXOL 350 MG/ML SOLN COMPARISON:  CT 06/13/2023. older CT MRI exams as well. FINDINGS: Lower chest: There is some linear opacity seen at the left lower lobe greater than lingula and right lower lobe, areas of scar atelectatic change. Similar distribution to previous. The opacity left lower lobe appears increased. No significant pleural effusion. Coronary artery calcifications are seen. Small hiatal hernia. Hepatobiliary: Stones seen in the nondilated urinary bladder. There is some ectasia of the biliary tree which appears slightly more prominent today than previous. Normal tapering is not as gradual as expected. Slight intrahepatic biliary duct ectasia as well. No space-occupying liver lesion. Patent portal vein. Pancreas: Unremarkable. No pancreatic ductal dilatation or surrounding inflammatory changes. Spleen: Normal in size without focal abnormality. Adrenals/Urinary Tract: The adrenal glands are preserved. No enhancing renal mass or collecting system dilatation. The ureters have a normal  course and caliber extending down to the urinary bladder. Preserved contour to the urinary bladder. Stomach/Bowel: No oral contrast. The stomach is nondilated. Duodenum is relatively collapsed as well. The colon is dilated insert lay areas with areas of mild wall/fold thickening. Some loops are dilated up to 10.5 cm such as along the rectum. There is a very large  amount of stool in the rectum as well as impacted stool suggested throughout the redundant sigmoid colon with few air-fluid levels. Also significant stool along the descending colon. There are stool fluid levels along the transverse colon which are dilated as well up to 9.8 cm. Surgical changes along the right side of the colon. In addition there is some dilated loops of small bowel mid abdomen with some air-fluid levels measuring up to 4.2 cm. Few areas of high attenuation debris along the colon which could be ingested contents. No areas of pneumatosis. No portal venous gas suggested. Vascular/Lymphatic: Diffuse vascular calcifications along the aorta and branch vessels. Normal caliber aorta and IVC. No discrete abnormal lymph node enlargement identified in the abdomen and pelvis. Reproductive: Status post hysterectomy. No adnexal masses. Other: Anasarca. Presumed mesh along the anterior abdominal wall for hernia repair. Few areas of free fluid particularly along the right side of the abdomen, adjacent to liver and pericolic gutter. Increased. Musculoskeletal: Osteopenia. Degenerative changes seen spine pelvis. Changes of previous right hemilaminectomy at L5. Multilevel disc bulging. Stable compression deformity of the superior endplate of L1 with Schmorl's node. Canal stenosis seen particularly at L4-5. IMPRESSION: Extensive colonic stool identified with dilatation of the colon measuring up to 10.5 cm. Several air-fluid levels as well in areas less impacted by stool. Please correlate for any evidence of a distal stenosis at the anorectal region with these changes and chronicity. Several mildly dilated loops of small bowel as well with air-fluid levels. This could be secondary to the colonic process but would recommend follow-up. Slight increase in trace ascites. Stones in the gallbladder. There is mild biliary duct dilatation which is developing from the prior examination. There is some tapering of the distal  common duct but not smoothly as expected. With the interval change, an ampullary lesion is difficult to completely exclude. Please correlate with any clinical presentation and prior workup. Additional evaluation as clinically appropriate Surgical changes identified along the right side of the colon. Spine surgery as well. Anterior abdominal wall mesh. Electronically Signed   By: Karen Kays M.D.   On: 07/03/2023 15:05       Assessment / Plan:    78 y/o female here with the following:  Ileus - dilated colon and small bowel on imaging Constipation Anal fissure  Did pass some stool / gas overnight, states it did help some and her abdomen is feeling somewhat better this AM. Pulled out NG. No imaging or labs done yet this AM.  Recommend xray for comparison this AM and await labs - make sure K and Mg repleted. She has an anal fissure which is painful - states she had DRE on Monday and no impaction. Would apply some lidocaine jelly to the fissure (we do not have topical nitroglycerin / diltiazem in the hospital) and may need SMOG enema today. She is not a candidate for neostigmine given her asthma history. Will measure colonic diameter on xray - hopefully she does not need decompression and can manage this conservatively. May need to replace NG pending her course and consider rectal tube.  Will follow.  Harlin Rain, MD [facility]

## 2023-07-05 NOTE — Progress Notes (Addendum)
 Progress Note  LOS: 0 days     Subjective   Patient has NG tube.  Passed gas and had 2 stools overnight.  Has significant abdominal distention and reports some abdominal discomfort.  Patient had abdominal x-ray that showed several tablets overlying the pelvis but unsure whether this is internal or external.  She notes that she took Ginko Biloba  tablets many months ago to improve her memory.  She is taking them once daily until she noted she was expelling them in her feces and then she stopped taking them.  She notes that she continues to see these tablets in her stool every now and again even though she has not taken a tablet in greater than 6 months.   Objective   Vital signs in last 24 hours: Temp:  [97.7 F (36.5 C)-98.6 F (37 C)] 97.7 F (36.5 C) (03/08 0918) Pulse Rate:  [85-116] 98 (03/08 1022) Resp:  [18] 18 (03/08 0918) BP: (113-147)/(66-121) 126/89 (03/08 1022) SpO2:  [98 %-100 %] 100 % (03/08 0918) Last BM Date : 07/05/23 Last BM recorded by nurses in past 5 days Stool Type: Type 4 (Like a smooth, soft sausage or snake); Type 7 (Liquid consistency with no solid pieces) (07/05/2023  8:48 AM)  General:   female in no acute distress  Heart:  Regular rate and rhythm; no murmurs Pulm: Clear anteriorly; no wheezing Abdomen: Significantly distended abdomen with hyperactive bowel sounds, mild tenderness Rectal: Several external hemorrhoids noted.  Fissure noted.  No impaction noted on digital rectal exam though somewhat limited due to stenosis/tight sphincter tone.  Also limited due to patient discomfort.  Used lidocaine to help with fissure but patient still somewhat uncomfortable. Extremities:  No edema Neurologic:  Alert and  oriented x4;  No focal deficits.  Psych:  Cooperative. Normal mood and affect.  Intake/Output from previous day: 03/07 0701 - 03/08 0700 In: 703.3 [I.V.:703.3] Out: -  Intake/Output this shift: Total I/O In: 436 [P.O.:436] Out: -    Studies/Results: DG Abd 1 View Result Date: 07/05/2023 CLINICAL DATA:  [id number] Encounter for nasogastric tube placement [id number] EXAM: ABDOMEN - 1 VIEW COMPARISON:  Abdominal radiograph from earlier today FINDINGS: Enteric tube tip and side port in the proximal stomach. Hernia repair mesh overlies the central and upper abdomen. Diffuse mild-to-moderate small bowel dilatation in the visualized upper abdomen, improved. Diffuse colonic gas appears similar. No definite pneumatosis or pneumoperitoneum. IMPRESSION: 1. Enteric tube tip and side port in the proximal stomach. 2. Diffuse mild-to-moderate small bowel dilatation in the visualized upper abdomen and diffuse colonic gas, improved. Electronically Signed   By: Delbert Phenix M.D.   On: 07/05/2023 14:19   DG Abd 2 Views Result Date: 07/05/2023 CLINICAL DATA:  Constipation.  Abdominal pain and distention. EXAM: ABDOMEN - 2 VIEW COMPARISON:  July 04, 2023. FINDINGS: Dilated large and small bowel loops are noted with air-fluid levels concerning for distal bowel obstruction. Status post hernia repair. Large amount of probable tablets are seen projected over the pelvis which may be external; clinical correlation is recommended. IMPRESSION: Dilated large and small bowel loops are noted with air-fluid levels concerning for distal bowel obstruction. There appears to be multiple tablets projected over the pelvis which may be external; clinical correlation is recommended to rule out large amount of ingested tablets. Electronically Signed   By: Lupita Raider M.D.   On: 07/05/2023 13:42   DG Abd Portable 1V Result Date: 07/04/2023 CLINICAL DATA:  Feeding tube placement. EXAM:  PORTABLE ABDOMEN - 1 VIEW COMPARISON:  CT yesterday FINDINGS: Tip and side port of the enteric tube below the diaphragm in the stomach. There is diffuse bowel distension similar to yesterday's CT. IMPRESSION: Tip and side port of the enteric tube below the diaphragm in the stomach. Electronically  Signed   By: Narda Rutherford M.D.   On: 07/04/2023 18:14    Lab Results: Recent Labs    07/03/23 1030 07/04/23 0955 07/05/23 0717  WBC 7.8 7.7 9.8  HGB 12.2 12.5 12.9  HCT 36.6 37.6 38.5  PLT 264 243 275   BMET Recent Labs    07/03/23 1030 07/04/23 0955 07/05/23 0717  NA 139 142 147*  K 3.0* 3.5 3.8  CL 100 104 106  CO2 30 23 21*  GLUCOSE 101* 80 81  BUN 9 9 15   CREATININE 1.08* 1.10* 1.45*  CALCIUM 9.3 9.2 9.4   LFT Recent Labs    07/05/23 0717  PROT 5.9*  ALBUMIN 3.3*  AST 19  ALT 16  ALKPHOS 54  BILITOT 2.1*   PT/INR No results for input(s): "LABPROT", "INR" in the last 72 hours.   Scheduled Meds:  atorvastatin  10 mg Oral QHS   enoxaparin (LOVENOX) injection  30 mg Subcutaneous Q24H   famotidine  20 mg Oral Daily   furosemide  40 mg Oral Daily   lidocaine  1 Application Topical Once   linaclotide  145 mcg Oral q morning   loratadine  10 mg Oral Daily   mometasone-formoterol  2 puff Inhalation BID   montelukast  10 mg Oral QHS   potassium chloride  40 mEq Oral BID   Continuous Infusions:    Impression/Plan:   Ileus Constipation Anal fissure Anal Stenosis Repeat abdominal x-ray today with worsening dilated large and small bowel loops and noted tablets in the pelvis (possibly Ginko Biloba  tablets from months ago?) concern pills may be causing rectal bezoar. - Recommend lidocaine jelly with attempt at small volume smog enema and rectal tube placement - Continue to monitor NG tube output - Continue to make sure K and Mg are repleted. -- plan for flexible sigmoidoscopy tomorrow -- repeat smog enema in the morning 2 hours prior to sigmoidoscopy -- NPO midnight  Adiya Selmer Leanna Sato  07/05/2023, 2:43 PM

## 2023-07-05 NOTE — Progress Notes (Signed)
 Spoke with patient about placing a rectal tube. Patient stated that she does not believe she can tolerate it and that she is already in too much pain and would like to defer placement of the rectal tube at this time. Patient had a very large bowel movement as well

## 2023-07-05 NOTE — Progress Notes (Signed)
 PROGRESS NOTE    Katherine Floyd  ZOX:[id number] DOB: Sep 15, 1945 DOA: 07/03/2023 PCP: Richmond Campbell., PA-C  Chief Complaint  Patient presents with   bowel obstruction   Abdominal Pain    Hospital Course:  Katherine Floyd is 78 y.o. female with paroxysmal A-fib not on anticoagulation, chronic constipation/irritable bowel syndrome on Linzess, history of prior small bowel obstruction, who was sent to [facility], ED from prior GI practice this morning due to abdominal distention and concern for SBO.  Patient denies any nausea vomiting.  She does report leaking liquid stool for the past few days.  Patient was admitted for bowel rest  Subjective: Pt removed NG this AM. Endorses 2 stools overnight. On evaluation midday patient is reporting significant abdominal pain.  Continues to have high-pitched bowel sounds.  Has not had any further gas.   Objective: Vitals:   07/04/23 1533 07/04/23 2013 07/05/23 0507 07/05/23 0746  BP: 113/66 (!) 147/83 (!) 141/87   Pulse: 88 85 95   Resp: 18 18 18    Temp: 98 F (36.7 C) 98.4 F (36.9 C) 98.6 F (37 C)   TempSrc:  Oral Oral   SpO2: 99% 99% 98% 99%  Weight:      Height:        Intake/Output Summary (Last 24 hours) at 07/05/2023 0850 Last data filed at 07/04/2023 1503 Gross per 24 hour  Intake 703.27 ml  Output --  Net 703.27 ml   Filed Weights   07/04/23 1409  Weight: 44 kg    Examination: General exam: Appears calm and appears uncomfortable. Respiratory system: No work of breathing, symmetric chest wall expansion Cardiovascular system: S1 & S2 heard, RRR.  Gastrointestinal system: Abdomen is distended and diffusely tender to palpation, high pitched bowel sounds. Neuro: Alert and oriented. No focal neurological deficits. Extremities: Symmetric, expected ROM Skin: No rashes, lesions Psychiatry: Demonstrates appropriate judgement and insight. Mood & affect appropriate for situation.   Assessment & Plan:  Principal Problem:    Bowel obstruction (HCC) Active Problems:   Bile duct abnormality   Chronic heart failure with preserved ejection fraction (HFpEF) (HCC)   Paroxysmal atrial fibrillation (HCC)   HTN (hypertension)   Hypokalemia   Colon distention   Obstipation   Anal fissure    Small bowel obstruction Colonic Obstruction - CT notes extensive colonic stool with dilation of colon up to 10.5 cm - NG tube was placed on 3/7 and subsequently removed by pt early this morning.  I have ordered for replacement now - Continue n.p.o. - Gastroenterology following, appreciate recommendations. - They are recommending enema and rectal tube at this time.  Will repeat KUB in AM. -- Likely needs flex sig to clear foreign body  Foreign body in Colon - KUB (and CT on arrival) today shows multiple tablets within the colon.  Patient endorses that many months ago she took ginkgo Biloba tablets to help improve her memory but started expelling these in her feces and discontinued them.  She reports that she still sees these tablets in her stools though she has not taken any in over 6 months.  On Chart review, I do see these tabs in the colon at least as far back as November 2024 on MR  Ascites - Trace ascites incidentally seen on CT  Cholelithiasis - Seen on CT, mild biliary duct dilation and some tapering of the distal common duct.  Cannot entirely exclude ampullary lesion. - This was noted on prior CT and assessed with EGD  in November 2024 - Pancreas is unremarkable - LFTs: Within normal limits  Paroxysmal A-fib - Sinus rhythm on arrival - Continue home meds  Chronic heart failure with preserved EF - Clinically euvolemic - Continue home meds  Hypertension - Continue home meds  Hypokalemia - Replace as needed   DVT prophylaxis: Lovenox   Code Status: Full Code Family Communication:  Discussed directly with patient and husband at bedside Disposition:  Inpatient, still hospitalized for SBO management, Has NGT  and rectal tube. Will discharge to home eventually when medically stable.  Consultants:  Treatment Team:  Consulting Physician: Jeani Hawking, MD Consulting Physician: Charna Elizabeth, MD Consulting Physician: Benancio Deeds, MD  Procedures:    Antimicrobials:  Anti-infectives (From admission, onward)    None       Data Reviewed: I have personally reviewed following labs and imaging studies CBC: Recent Labs  Lab 07/03/23 1030 07/04/23 0955 07/05/23 0717  WBC 7.8 7.7 9.8  NEUTROABS  --  5.2 7.5  HGB 12.2 12.5 12.9  HCT 36.6 37.6 38.5  MCV 89.3 90.4 89.5  PLT 264 243 275   Basic Metabolic Panel: Recent Labs  Lab 07/03/23 1030 07/04/23 0955  NA 139 142  K 3.0* 3.5  CL 100 104  CO2 30 23  GLUCOSE 101* 80  BUN 9 9  CREATININE 1.08* 1.10*  CALCIUM 9.3 9.2   GFR: Estimated Creatinine Clearance: 28.3 mL/min (A) (by C-G formula based on SCr of 1.1 mg/dL (H)). Liver Function Tests: Recent Labs  Lab 07/03/23 1030 07/04/23 0955  AST 22 19  ALT 18 16  ALKPHOS 55 57  BILITOT 0.7 1.3*  PROT 6.2* 6.1*  ALBUMIN 3.6 3.5   CBG: No results for input(s): "GLUCAP" in the last 168 hours.  No results found for this or any previous visit (from the past 240 hours).   Radiology Studies: DG Abd Portable 1V Result Date: 07/04/2023 CLINICAL DATA:  Feeding tube placement. EXAM: PORTABLE ABDOMEN - 1 VIEW COMPARISON:  CT yesterday FINDINGS: Tip and side port of the enteric tube below the diaphragm in the stomach. There is diffuse bowel distension similar to yesterday's CT. IMPRESSION: Tip and side port of the enteric tube below the diaphragm in the stomach. Electronically Signed   By: Narda Rutherford M.D.   On: 07/04/2023 18:14   CT ABDOMEN PELVIS W CONTRAST Result Date: 07/03/2023 CLINICAL DATA:  Bloating a watery stools for 2 weeks. Possible obstruction. EXAM: CT ABDOMEN AND PELVIS WITH CONTRAST TECHNIQUE: Multidetector CT imaging of the abdomen and pelvis was performed  using the standard protocol following bolus administration of intravenous contrast. RADIATION DOSE REDUCTION: This exam was performed according to the departmental dose-optimization program which includes automated exposure control, adjustment of the mA and/or kV according to patient size and/or use of iterative reconstruction technique. CONTRAST:  75mL OMNIPAQUE IOHEXOL 350 MG/ML SOLN COMPARISON:  CT 06/13/2023. older CT MRI exams as well. FINDINGS: Lower chest: There is some linear opacity seen at the left lower lobe greater than lingula and right lower lobe, areas of scar atelectatic change. Similar distribution to previous. The opacity left lower lobe appears increased. No significant pleural effusion. Coronary artery calcifications are seen. Small hiatal hernia. Hepatobiliary: Stones seen in the nondilated urinary bladder. There is some ectasia of the biliary tree which appears slightly more prominent today than previous. Normal tapering is not as gradual as expected. Slight intrahepatic biliary duct ectasia as well. No space-occupying liver lesion. Patent portal vein. Pancreas: Unremarkable. No pancreatic  ductal dilatation or surrounding inflammatory changes. Spleen: Normal in size without focal abnormality. Adrenals/Urinary Tract: The adrenal glands are preserved. No enhancing renal mass or collecting system dilatation. The ureters have a normal course and caliber extending down to the urinary bladder. Preserved contour to the urinary bladder. Stomach/Bowel: No oral contrast. The stomach is nondilated. Duodenum is relatively collapsed as well. The colon is dilated insert lay areas with areas of mild wall/fold thickening. Some loops are dilated up to 10.5 cm such as along the rectum. There is a very large amount of stool in the rectum as well as impacted stool suggested throughout the redundant sigmoid colon with few air-fluid levels. Also significant stool along the descending colon. There are stool fluid  levels along the transverse colon which are dilated as well up to 9.8 cm. Surgical changes along the right side of the colon. In addition there is some dilated loops of small bowel mid abdomen with some air-fluid levels measuring up to 4.2 cm. Few areas of high attenuation debris along the colon which could be ingested contents. No areas of pneumatosis. No portal venous gas suggested. Vascular/Lymphatic: Diffuse vascular calcifications along the aorta and branch vessels. Normal caliber aorta and IVC. No discrete abnormal lymph node enlargement identified in the abdomen and pelvis. Reproductive: Status post hysterectomy. No adnexal masses. Other: Anasarca. Presumed mesh along the anterior abdominal wall for hernia repair. Few areas of free fluid particularly along the right side of the abdomen, adjacent to liver and pericolic gutter. Increased. Musculoskeletal: Osteopenia. Degenerative changes seen spine pelvis. Changes of previous right hemilaminectomy at L5. Multilevel disc bulging. Stable compression deformity of the superior endplate of L1 with Schmorl's node. Canal stenosis seen particularly at L4-5. IMPRESSION: Extensive colonic stool identified with dilatation of the colon measuring up to 10.5 cm. Several air-fluid levels as well in areas less impacted by stool. Please correlate for any evidence of a distal stenosis at the anorectal region with these changes and chronicity. Several mildly dilated loops of small bowel as well with air-fluid levels. This could be secondary to the colonic process but would recommend follow-up. Slight increase in trace ascites. Stones in the gallbladder. There is mild biliary duct dilatation which is developing from the prior examination. There is some tapering of the distal common duct but not smoothly as expected. With the interval change, an ampullary lesion is difficult to completely exclude. Please correlate with any clinical presentation and prior workup. Additional  evaluation as clinically appropriate Surgical changes identified along the right side of the colon. Spine surgery as well. Anterior abdominal wall mesh. Electronically Signed   By: Karen Kays M.D.   On: 07/03/2023 15:05    Scheduled Meds:  atorvastatin  10 mg Oral QHS   enoxaparin (LOVENOX) injection  40 mg Subcutaneous Q24H   famotidine  20 mg Oral Daily   furosemide  40 mg Oral Daily   lidocaine  1 Application Topical Once   linaclotide  145 mcg Oral q morning   loratadine  10 mg Oral Daily   mometasone-formoterol  2 puff Inhalation BID   montelukast  10 mg Oral QHS   potassium chloride  40 mEq Oral BID   Continuous Infusions:     LOS: 0 days   Total time spent interpreting labs and vitals, coordinating care amongst consultants and care team members, directly assessing and discussing care with the patient and/or family: 55 min   Debarah Crape, DO [facility]  To contact the attending physician between 7A-7P  please use Epic Chat. To contact the covering physician during after hours 7P-7A, please review Amion.   07/05/2023, 8:50 AM   *This document has been created with the assistance of dictation software. Please excuse typographical errors. *

## 2023-07-05 NOTE — Plan of Care (Signed)
 Patient AAOx4. NGT came out this morning while up to bathroom, MD notified. PRN tylenol given and heat applied with + effect. LBM today. NGT replaced and connected to intermittent LWS. SMOG enema given. Awaiting rectal tube from supply, had not arrived by end of shift. Informed oncoming nurse. Safety precautions maintained. Commode placed at bedside.   Problem: Education: Goal: Knowledge of General Education information will improve Description: Including pain rating scale, medication(s)/side effects and non-pharmacologic comfort measures Outcome: Progressing   Problem: Health Behavior/Discharge Planning: Goal: Ability to manage health-related needs will improve Outcome: Progressing   Problem: Elimination: Goal: Will not experience complications related to bowel motility Outcome: Progressing   Problem: Pain Managment: Goal: General experience of comfort will improve and/or be controlled Outcome: Progressing

## 2023-07-06 ENCOUNTER — Encounter (HOSPITAL_COMMUNITY): Admission: EM | Disposition: E | Payer: Self-pay | Source: Ambulatory Visit | Attending: Family Medicine

## 2023-07-06 ENCOUNTER — Inpatient Hospital Stay (HOSPITAL_COMMUNITY)

## 2023-07-06 ENCOUNTER — Other Ambulatory Visit (HOSPITAL_COMMUNITY)

## 2023-07-06 ENCOUNTER — Encounter (HOSPITAL_COMMUNITY): Payer: Self-pay | Admitting: Family Medicine

## 2023-07-06 DIAGNOSIS — K5981 Ogilvie syndrome: Secondary | ICD-10-CM | POA: Diagnosis not present

## 2023-07-06 DIAGNOSIS — K5939 Other megacolon: Secondary | ICD-10-CM

## 2023-07-06 DIAGNOSIS — R140 Abdominal distension (gaseous): Secondary | ICD-10-CM | POA: Diagnosis not present

## 2023-07-06 DIAGNOSIS — J690 Pneumonitis due to inhalation of food and vomit: Secondary | ICD-10-CM

## 2023-07-06 DIAGNOSIS — K5900 Constipation, unspecified: Secondary | ICD-10-CM | POA: Diagnosis not present

## 2023-07-06 DIAGNOSIS — K6389 Other specified diseases of intestine: Secondary | ICD-10-CM | POA: Diagnosis not present

## 2023-07-06 DIAGNOSIS — K567 Ileus, unspecified: Secondary | ICD-10-CM | POA: Diagnosis not present

## 2023-07-06 DIAGNOSIS — E8729 Other acidosis: Secondary | ICD-10-CM | POA: Diagnosis not present

## 2023-07-06 DIAGNOSIS — N179 Acute kidney failure, unspecified: Secondary | ICD-10-CM | POA: Diagnosis not present

## 2023-07-06 DIAGNOSIS — E872 Acidosis, unspecified: Secondary | ICD-10-CM

## 2023-07-06 DIAGNOSIS — E46 Unspecified protein-calorie malnutrition: Secondary | ICD-10-CM

## 2023-07-06 DIAGNOSIS — R739 Hyperglycemia, unspecified: Secondary | ICD-10-CM

## 2023-07-06 DIAGNOSIS — K624 Stenosis of anus and rectum: Secondary | ICD-10-CM | POA: Diagnosis not present

## 2023-07-06 HISTORY — PX: FLEXIBLE SIGMOIDOSCOPY: SHX5431

## 2023-07-06 HISTORY — PX: BOWEL DECOMPRESSION: SHX5532

## 2023-07-06 LAB — CBC
HCT: 45.1 % (ref 36.0–46.0)
HCT: 45.6 % (ref 36.0–46.0)
Hemoglobin: 13.5 g/dL (ref 12.0–15.0)
Hemoglobin: 14.6 g/dL (ref 12.0–15.0)
MCH: 29.8 pg (ref 26.0–34.0)
MCH: 30 pg (ref 26.0–34.0)
MCHC: 29.9 g/dL — ABNORMAL LOW (ref 30.0–36.0)
MCHC: 32 g/dL (ref 30.0–36.0)
MCV: 99.6 fL (ref 80.0–100.0)
MCV: 93.6 fL (ref 80.0–100.0)
Platelets: 270 10*3/uL (ref 150–400)
Platelets: 219 10*3/uL (ref 150–400)
RBC: 4.53 MIL/uL (ref 3.87–5.11)
RBC: 4.87 MIL/uL (ref 3.87–5.11)
RDW: 15.3 % (ref 11.5–15.5)
RDW: 15.2 % (ref 11.5–15.5)
WBC: 14.1 10*3/uL — ABNORMAL HIGH (ref 4.0–10.5)
WBC: 9.3 10*3/uL (ref 4.0–10.5)
nRBC: 0 % (ref 0.0–0.2)
nRBC: 0.3 % — ABNORMAL HIGH (ref 0.0–0.2)

## 2023-07-06 LAB — GLUCOSE, CAPILLARY
Glucose-Capillary: 249 mg/dL — ABNORMAL HIGH (ref 70–99)
Glucose-Capillary: 389 mg/dL — ABNORMAL HIGH (ref 70–99)
Glucose-Capillary: 260 mg/dL — ABNORMAL HIGH (ref 70–99)
Glucose-Capillary: 156 mg/dL — ABNORMAL HIGH (ref 70–99)
Glucose-Capillary: 39 mg/dL — CL (ref 70–99)
Glucose-Capillary: 170 mg/dL — ABNORMAL HIGH (ref 70–99)
Glucose-Capillary: 52 mg/dL — ABNORMAL LOW (ref 70–99)

## 2023-07-06 LAB — CBC WITH DIFFERENTIAL/PLATELET
Abs Immature Granulocytes: 0.05 10*3/uL (ref 0.00–0.07)
Abs Immature Granulocytes: 0.07 10*3/uL (ref 0.00–0.07)
Basophils Absolute: 0 10*3/uL (ref 0.0–0.1)
Basophils Absolute: 0 10*3/uL (ref 0.0–0.1)
Basophils Relative: 0 %
Basophils Relative: 0 %
Eosinophils Absolute: 0 10*3/uL (ref 0.0–0.5)
Eosinophils Absolute: 0 10*3/uL (ref 0.0–0.5)
Eosinophils Relative: 0 %
Eosinophils Relative: 0 %
HCT: 44.7 % (ref 36.0–46.0)
HCT: 46.1 % — ABNORMAL HIGH (ref 36.0–46.0)
Hemoglobin: 14.9 g/dL (ref 12.0–15.0)
Hemoglobin: 15.4 g/dL — ABNORMAL HIGH (ref 12.0–15.0)
Immature Granulocytes: 1 %
Immature Granulocytes: 1 %
Lymphocytes Relative: 10 %
Lymphocytes Relative: 6 %
Lymphs Abs: 0.8 10*3/uL (ref 0.7–4.0)
Lymphs Abs: 1 10*3/uL (ref 0.7–4.0)
MCH: 29.7 pg (ref 26.0–34.0)
MCH: 30 pg (ref 26.0–34.0)
MCHC: 33.3 g/dL (ref 30.0–36.0)
MCHC: 33.4 g/dL (ref 30.0–36.0)
MCV: 89 fL (ref 80.0–100.0)
MCV: 89.7 fL (ref 80.0–100.0)
Monocytes Absolute: 1 10*3/uL (ref 0.1–1.0)
Monocytes Absolute: 1.3 10*3/uL — ABNORMAL HIGH (ref 0.1–1.0)
Monocytes Relative: 10 %
Monocytes Relative: 10 %
Neutro Abs: 10.9 10*3/uL — ABNORMAL HIGH (ref 1.7–7.7)
Neutro Abs: 7.2 10*3/uL (ref 1.7–7.7)
Neutrophils Relative %: 79 %
Neutrophils Relative %: 83 %
Platelets: 416 10*3/uL — ABNORMAL HIGH (ref 150–400)
Platelets: 420 10*3/uL — ABNORMAL HIGH (ref 150–400)
RBC: 5.02 MIL/uL (ref 3.87–5.11)
RBC: 5.14 MIL/uL — ABNORMAL HIGH (ref 3.87–5.11)
RDW: 14.8 % (ref 11.5–15.5)
RDW: 14.8 % (ref 11.5–15.5)
WBC: 13.1 10*3/uL — ABNORMAL HIGH (ref 4.0–10.5)
WBC: 9.2 10*3/uL (ref 4.0–10.5)
nRBC: 0 % (ref 0.0–0.2)
nRBC: 0 % (ref 0.0–0.2)

## 2023-07-06 LAB — BASIC METABOLIC PANEL
Anion gap: 24 — ABNORMAL HIGH (ref 5–15)
BUN: 27 mg/dL — ABNORMAL HIGH (ref 8–23)
CO2: 14 mmol/L — ABNORMAL LOW (ref 22–32)
Calcium: 9.6 mg/dL (ref 8.9–10.3)
Chloride: 112 mmol/L — ABNORMAL HIGH (ref 98–111)
Creatinine, Ser: 2.16 mg/dL — ABNORMAL HIGH (ref 0.44–1.00)
GFR, Estimated: 23 mL/min — ABNORMAL LOW (ref >60)
Glucose, Bld: 312 mg/dL — ABNORMAL HIGH (ref 70–99)
Potassium: 3.9 mmol/L (ref 3.5–5.1)
Sodium: 150 mmol/L — ABNORMAL HIGH (ref 135–145)

## 2023-07-06 LAB — COMPREHENSIVE METABOLIC PANEL
ALT: 22 U/L (ref 0–44)
ALT: 20 U/L (ref 0–44)
AST: 27 U/L (ref 15–41)
AST: 25 U/L (ref 15–41)
Albumin: 4 g/dL (ref 3.5–5.0)
Albumin: 3.8 g/dL (ref 3.5–5.0)
Alkaline Phosphatase: 64 U/L (ref 38–126)
Alkaline Phosphatase: 62 U/L (ref 38–126)
Anion gap: 26 — ABNORMAL HIGH (ref 5–15)
Anion gap: 24 — ABNORMAL HIGH (ref 5–15)
BUN: 26 mg/dL — ABNORMAL HIGH (ref 8–23)
BUN: 28 mg/dL — ABNORMAL HIGH (ref 8–23)
CO2: 19 mmol/L — ABNORMAL LOW (ref 22–32)
CO2: 20 mmol/L — ABNORMAL LOW (ref 22–32)
Calcium: 10.7 mg/dL — ABNORMAL HIGH (ref 8.9–10.3)
Calcium: 11.2 mg/dL — ABNORMAL HIGH (ref 8.9–10.3)
Chloride: 112 mmol/L — ABNORMAL HIGH (ref 98–111)
Chloride: 117 mmol/L — ABNORMAL HIGH (ref 98–111)
Creatinine, Ser: 1.94 mg/dL — ABNORMAL HIGH (ref 0.44–1.00)
Creatinine, Ser: 1.81 mg/dL — ABNORMAL HIGH (ref 0.44–1.00)
GFR, Estimated: 26 mL/min — ABNORMAL LOW (ref >60)
GFR, Estimated: 28 mL/min — ABNORMAL LOW (ref >60)
Glucose, Bld: 289 mg/dL — ABNORMAL HIGH (ref 70–99)
Glucose, Bld: 244 mg/dL — ABNORMAL HIGH (ref 70–99)
Potassium: 3.7 mmol/L (ref 3.5–5.1)
Potassium: 3.8 mmol/L (ref 3.5–5.1)
Sodium: 157 mmol/L — ABNORMAL HIGH (ref 135–145)
Sodium: 161 mmol/L (ref 135–145)
Total Bilirubin: 1.8 mg/dL — ABNORMAL HIGH (ref 0.0–1.2)
Total Bilirubin: 1.1 mg/dL (ref 0.0–1.2)
Total Protein: 7 g/dL (ref 6.5–8.1)
Total Protein: 6.6 g/dL (ref 6.5–8.1)

## 2023-07-06 LAB — POCT I-STAT 7, (LYTES, BLD GAS, ICA,H+H)
Acid-base deficit: 13 mmol/L — ABNORMAL HIGH (ref 0.0–2.0)
Acid-base deficit: 11 mmol/L — ABNORMAL HIGH (ref 0.0–2.0)
Acid-base deficit: 11 mmol/L — ABNORMAL HIGH (ref 0.0–2.0)
Bicarbonate: 14.5 mmol/L — ABNORMAL LOW (ref 20.0–28.0)
Bicarbonate: 15.6 mmol/L — ABNORMAL LOW (ref 20.0–28.0)
Bicarbonate: 15.3 mmol/L — ABNORMAL LOW (ref 20.0–28.0)
Calcium, Ion: 1.3 mmol/L (ref 1.15–1.40)
Calcium, Ion: 1.3 mmol/L (ref 1.15–1.40)
Calcium, Ion: 1.15 mmol/L (ref 1.15–1.40)
HCT: 39 % (ref 36.0–46.0)
HCT: 41 % (ref 36.0–46.0)
HCT: 41 % (ref 36.0–46.0)
Hemoglobin: 13.3 g/dL (ref 12.0–15.0)
Hemoglobin: 13.9 g/dL (ref 12.0–15.0)
Hemoglobin: 13.9 g/dL (ref 12.0–15.0)
O2 Saturation: 98 %
O2 Saturation: 99 %
O2 Saturation: 96 %
Potassium: 3.4 mmol/L — ABNORMAL LOW (ref 3.5–5.1)
Potassium: 3.8 mmol/L (ref 3.5–5.1)
Potassium: 4.2 mmol/L (ref 3.5–5.1)
Sodium: 156 mmol/L — ABNORMAL HIGH (ref 135–145)
Sodium: 150 mmol/L — ABNORMAL HIGH (ref 135–145)
Sodium: 144 mmol/L (ref 135–145)
TCO2: 16 mmol/L — ABNORMAL LOW (ref 22–32)
TCO2: 17 mmol/L — ABNORMAL LOW (ref 22–32)
TCO2: 16 mmol/L — ABNORMAL LOW (ref 22–32)
pCO2 arterial: 39.1 mmHg (ref 32–48)
pCO2 arterial: 37.4 mmHg (ref 32–48)
pCO2 arterial: 35.2 mmHg (ref 32–48)
pH, Arterial: 7.178 — CL (ref 7.35–7.45)
pH, Arterial: 7.227 — ABNORMAL LOW (ref 7.35–7.45)
pH, Arterial: 7.247 — ABNORMAL LOW (ref 7.35–7.45)
pO2, Arterial: 142 mmHg — ABNORMAL HIGH (ref 83–108)
pO2, Arterial: 181 mmHg — ABNORMAL HIGH (ref 83–108)
pO2, Arterial: 98 mmHg (ref 83–108)

## 2023-07-06 LAB — MAGNESIUM: Magnesium: 3.2 mg/dL — ABNORMAL HIGH (ref 1.7–2.4)

## 2023-07-06 LAB — HEMOGLOBIN A1C
Hgb A1c MFr Bld: 5.4 % (ref 4.8–5.6)
Mean Plasma Glucose: 108.28 mg/dL

## 2023-07-06 LAB — PHOSPHORUS: Phosphorus: 1.9 mg/dL — ABNORMAL LOW (ref 2.5–4.6)

## 2023-07-06 LAB — LACTIC ACID, PLASMA
Lactic Acid, Venous: 3.6 mmol/L (ref 0.5–1.9)
Lactic Acid, Venous: 4.1 mmol/L (ref 0.5–1.9)
Lactic Acid, Venous: 7.6 mmol/L (ref 0.5–1.9)
Lactic Acid, Venous: 8.6 mmol/L (ref 0.5–1.9)
Lactic Acid, Venous: >9 mmol/L (ref 0.5–1.9)

## 2023-07-06 LAB — MRSA NEXT GEN BY PCR, NASAL: MRSA by PCR Next Gen: NOT DETECTED

## 2023-07-06 SURGERY — SIGMOIDOSCOPY, FLEXIBLE
Anesthesia: Monitor Anesthesia Care

## 2023-07-06 MED ORDER — SODIUM CHLORIDE 0.9 % IV SOLN
2.0000 g | INTRAVENOUS | Status: DC
  Administered 2023-07-06: 2 g via INTRAVENOUS
  Filled 2023-07-06: qty 20

## 2023-07-06 MED ORDER — HYDROMORPHONE HCL 1 MG/ML IJ SOLN: 0.5000 mg | INTRAMUSCULAR | Status: DC | PRN

## 2023-07-06 MED ORDER — INSULIN ASPART 100 UNIT/ML IJ SOLN: 0.0000 [IU] | Freq: Three times a day (TID) | INTRAMUSCULAR | Status: DC

## 2023-07-06 MED ORDER — HYDROCORTISONE SOD SUC (PF) 100 MG IJ SOLR
100.0000 mg | Freq: Two times a day (BID) | INTRAMUSCULAR | Status: DC
  Administered 2023-07-06: 100 mg via INTRAVENOUS
  Filled 2023-07-06: qty 2

## 2023-07-06 MED ORDER — HYDROCORTISONE SOD SUC (PF) 100 MG IJ SOLR
100.0000 mg | Freq: Three times a day (TID) | INTRAMUSCULAR | Status: DC
Start: 2023-07-06 — End: 2023-07-06
  Administered 2023-07-06: 100 mg via INTRAVENOUS
  Filled 2023-07-06: qty 2

## 2023-07-06 MED ORDER — NEOSTIGMINE METHYLSULFATE 10 MG/10ML IV SOLN
0.2500 mg | Freq: Four times a day (QID) | INTRAVENOUS | Status: DC
  Administered 2023-07-06 (×2): 0.25 mg via SUBCUTANEOUS
  Filled 2023-07-06 (×4): qty 0.25

## 2023-07-06 MED ORDER — PROPOFOL 1000 MG/100ML IV EMUL
0.0000 ug/kg/min | INTRAVENOUS | Status: DC
  Administered 2023-07-06: 5 ug/kg/min via INTRAVENOUS
  Filled 2023-07-06: qty 100

## 2023-07-06 MED ORDER — METHYLENE BLUE (ANTIDOTE) 1 % IV SOLN
2.0000 mg/kg | Freq: Once | Status: AC
  Administered 2023-07-06: 88 mg via INTRAVENOUS
  Filled 2023-07-06: qty 8.8

## 2023-07-06 MED ORDER — GLYCOPYRROLATE 0.2 MG/ML IJ SOLN: 0.2000 mg | INTRAMUSCULAR | Status: DC | PRN

## 2023-07-06 MED ORDER — LACTATED RINGERS IV BOLUS
1000.0000 mL | Freq: Once | INTRAVENOUS | Status: AC
  Administered 2023-07-06: 1000 mL via INTRAVENOUS

## 2023-07-06 MED ORDER — PIPERACILLIN-TAZOBACTAM IN DEX 2-0.25 GM/50ML IV SOLN
2.2500 g | Freq: Three times a day (TID) | INTRAVENOUS | Status: DC
  Administered 2023-07-06: 2.25 g via INTRAVENOUS
  Filled 2023-07-06 (×2): qty 50

## 2023-07-06 MED ORDER — FENTANYL CITRATE (PF) 100 MCG/2ML IJ SOLN
INTRAMUSCULAR | Status: AC
  Filled 2023-07-06: qty 4

## 2023-07-06 MED ORDER — INSULIN GLARGINE 100 UNIT/ML ~~LOC~~ SOLN
8.0000 [IU] | Freq: Two times a day (BID) | SUBCUTANEOUS | Status: DC
  Administered 2023-07-06: 8 [IU] via SUBCUTANEOUS
  Filled 2023-07-06 (×2): qty 0.08

## 2023-07-06 MED ORDER — SODIUM CHLORIDE 0.9% FLUSH
10.0000 mL | Freq: Two times a day (BID) | INTRAVENOUS | Status: DC
Start: 2023-07-06 — End: 2023-07-07

## 2023-07-06 MED ORDER — ORAL CARE MOUTH RINSE: 15.0000 mL | OROMUCOSAL | Status: DC | PRN

## 2023-07-06 MED ORDER — DEXTROSE 50 % IV SOLN
INTRAVENOUS | Status: AC
Start: 2023-07-06 — End: 2023-07-06
  Administered 2023-07-06: 25 g via INTRAVENOUS
  Filled 2023-07-06: qty 50

## 2023-07-06 MED ORDER — CHLORHEXIDINE GLUCONATE CLOTH 2 % EX PADS: 6.0000 | MEDICATED_PAD | Freq: Every day | CUTANEOUS | Status: DC

## 2023-07-06 MED ORDER — INSULIN ASPART 100 UNIT/ML IJ SOLN
0.0000 [IU] | INTRAMUSCULAR | Status: DC
  Administered 2023-07-06: 15 [IU] via SUBCUTANEOUS

## 2023-07-06 MED ORDER — FENTANYL BOLUS VIA INFUSION: 50.0000 ug | INTRAVENOUS | Status: DC | PRN

## 2023-07-06 MED ORDER — NEOSTIGMINE METHYLSULFATE 10 MG/10ML IV SOLN
0.2500 mg | Freq: Four times a day (QID) | INTRAVENOUS | Status: DC
  Filled 2023-07-06 (×2): qty 0.25

## 2023-07-06 MED ORDER — PANTOPRAZOLE SODIUM 40 MG IV SOLR
40.0000 mg | INTRAVENOUS | Status: DC
  Administered 2023-07-06: 40 mg via INTRAVENOUS
  Filled 2023-07-06: qty 10

## 2023-07-06 MED ORDER — LACTATED RINGERS IV BOLUS
2000.0000 mL | Freq: Once | INTRAVENOUS | Status: AC
  Administered 2023-07-06: 2000 mL via INTRAVENOUS

## 2023-07-06 MED ORDER — VASOPRESSIN 20 UNITS/100 ML INFUSION FOR SHOCK
0.0000 [IU]/min | INTRAVENOUS | Status: DC
  Administered 2023-07-06 (×2): 0.03 [IU]/min via INTRAVENOUS
  Filled 2023-07-06 (×2): qty 100

## 2023-07-06 MED ORDER — STERILE WATER FOR INJECTION IV SOLN
INTRAVENOUS | Status: DC
  Filled 2023-07-06: qty 150
  Filled 2023-07-06: qty 1000

## 2023-07-06 MED ORDER — MIDAZOLAM HCL 2 MG/2ML IJ SOLN
INTRAMUSCULAR | Status: AC
  Filled 2023-07-06: qty 4

## 2023-07-06 MED ORDER — FENTANYL CITRATE PF 50 MCG/ML IJ SOSY: 50.0000 ug | PREFILLED_SYRINGE | Freq: Once | INTRAMUSCULAR | Status: DC

## 2023-07-06 MED ORDER — SODIUM CHLORIDE 0.9% FLUSH: 10.0000 mL | INTRAVENOUS | Status: DC | PRN

## 2023-07-06 MED ORDER — HYDROMORPHONE HCL 1 MG/ML IJ SOLN
1.0000 mg | INTRAMUSCULAR | Status: DC | PRN
  Administered 2023-07-06: 1 mg via INTRAVENOUS

## 2023-07-06 MED ORDER — GLYCOPYRROLATE 1 MG PO TABS: 1.0000 mg | ORAL_TABLET | ORAL | Status: DC | PRN

## 2023-07-06 MED ORDER — MIDAZOLAM HCL 2 MG/2ML IJ SOLN: 2.0000 mg | INTRAMUSCULAR | Status: DC | PRN

## 2023-07-06 MED ORDER — DEXTROSE 50 % IV SOLN: 0.0000 mL | INTRAVENOUS | Status: DC | PRN

## 2023-07-06 MED ORDER — MORPHINE BOLUS VIA INFUSION: 5.0000 mg | INTRAVENOUS | Status: DC | PRN

## 2023-07-06 MED ORDER — SODIUM BICARBONATE 8.4 % IV SOLN
100.0000 meq | Freq: Once | INTRAVENOUS | Status: AC
  Administered 2023-07-06: 100 meq via INTRAVENOUS
  Filled 2023-07-06: qty 50

## 2023-07-06 MED ORDER — NOREPINEPHRINE 4 MG/250ML-% IV SOLN
0.0000 ug/min | INTRAVENOUS | Status: DC
  Administered 2023-07-06: 10 ug/min via INTRAVENOUS
  Administered 2023-07-06: 22 ug/min via INTRAVENOUS
  Filled 2023-07-06: qty 250

## 2023-07-06 MED ORDER — SODIUM CHLORIDE 0.9 % IV SOLN: INTRAVENOUS | Status: DC | PRN

## 2023-07-06 MED ORDER — LACTATED RINGERS IV SOLN: INTRAVENOUS | Status: DC

## 2023-07-06 MED ORDER — NOREPINEPHRINE 16 MG/250ML-% IV SOLN
0.0000 ug/min | INTRAVENOUS | Status: DC
  Administered 2023-07-06: 55 ug/min via INTRAVENOUS
  Administered 2023-07-06: 22 ug/min via INTRAVENOUS
  Filled 2023-07-06 (×2): qty 250

## 2023-07-06 MED ORDER — INSULIN ASPART 100 UNIT/ML IJ SOLN
6.0000 [IU] | Freq: Once | INTRAMUSCULAR | Status: AC
  Administered 2023-07-06: 6 [IU] via SUBCUTANEOUS

## 2023-07-06 MED ORDER — INSULIN ASPART 100 UNIT/ML IJ SOLN
0.0000 [IU] | INTRAMUSCULAR | Status: DC
  Administered 2023-07-06: 5 [IU] via SUBCUTANEOUS

## 2023-07-06 MED ORDER — ALBUMIN HUMAN 25 % IV SOLN
25.0000 g | Freq: Four times a day (QID) | INTRAVENOUS | Status: DC
  Administered 2023-07-06: 25 g via INTRAVENOUS
  Filled 2023-07-06: qty 100

## 2023-07-06 MED ORDER — MORPHINE 100MG IN NS 100ML (1MG/ML) PREMIX INFUSION
0.0000 mg/h | INTRAVENOUS | Status: DC
  Administered 2023-07-06: 5 mg/h via INTRAVENOUS
  Filled 2023-07-06: qty 100

## 2023-07-06 MED ORDER — DEXTROSE 5 % IV SOLN: INTRAVENOUS | Status: DC

## 2023-07-06 MED ORDER — MIDAZOLAM HCL 2 MG/2ML IJ SOLN
2.0000 mg | Freq: Once | INTRAMUSCULAR | Status: AC
Start: 2023-07-06 — End: 2023-07-06
  Administered 2023-07-06: 2 mg via INTRAVENOUS

## 2023-07-06 MED ORDER — CHLORHEXIDINE GLUCONATE CLOTH 2 % EX PADS
6.0000 | MEDICATED_PAD | Freq: Every day | CUTANEOUS | Status: DC
  Administered 2023-07-06: 6 via TOPICAL

## 2023-07-06 MED ORDER — METRONIDAZOLE 500 MG/100ML IV SOLN
500.0000 mg | Freq: Two times a day (BID) | INTRAVENOUS | Status: DC
  Administered 2023-07-06: 500 mg via INTRAVENOUS
  Filled 2023-07-06: qty 100

## 2023-07-06 MED ORDER — PANTOPRAZOLE SODIUM 40 MG IV SOLR
40.0000 mg | Freq: Two times a day (BID) | INTRAVENOUS | Status: DC
  Administered 2023-07-06: 40 mg via INTRAVENOUS
  Filled 2023-07-06: qty 10

## 2023-07-06 MED ORDER — SODIUM CHLORIDE 0.9% FLUSH: 3.0000 mL | Freq: Two times a day (BID) | INTRAVENOUS | Status: DC

## 2023-07-06 MED ORDER — LACTATED RINGERS IV BOLUS
500.0000 mL | Freq: Once | INTRAVENOUS | Status: AC
  Administered 2023-07-06: 500 mL via INTRAVENOUS

## 2023-07-06 MED ORDER — HYDROMORPHONE HCL 1 MG/ML IJ SOLN
INTRAMUSCULAR | Status: AC
Start: 2023-07-06 — End: 2023-07-06
  Filled 2023-07-06: qty 1

## 2023-07-06 MED ORDER — FENTANYL 2500MCG IN NS 250ML (10MCG/ML) PREMIX INFUSION
50.0000 ug/h | INTRAVENOUS | Status: DC
  Administered 2023-07-06: 50 ug/h via INTRAVENOUS
  Filled 2023-07-06: qty 250

## 2023-07-06 MED ORDER — DEXTROSE 50 % IV SOLN: 25.0000 g | INTRAVENOUS | Status: AC

## 2023-07-06 MED ORDER — EPINEPHRINE 1 MG/10ML IJ SOSY
PREFILLED_SYRINGE | INTRAMUSCULAR | Status: AC
  Filled 2023-07-06: qty 10

## 2023-07-06 MED ORDER — BUDESONIDE 0.5 MG/2ML IN SUSP
0.5000 mg | Freq: Two times a day (BID) | RESPIRATORY_TRACT | Status: DC
  Administered 2023-07-06 (×2): 0.5 mg via RESPIRATORY_TRACT
  Filled 2023-07-06 (×2): qty 2

## 2023-07-06 MED ORDER — INSULIN ASPART 100 UNIT/ML IJ SOLN
0.0000 [IU] | INTRAMUSCULAR | Status: DC
  Administered 2023-07-06: 3 [IU] via SUBCUTANEOUS
  Administered 2023-07-06: 8 [IU] via SUBCUTANEOUS

## 2023-07-06 MED ORDER — FENTANYL CITRATE PF 50 MCG/ML IJ SOSY: 50.0000 ug | PREFILLED_SYRINGE | INTRAMUSCULAR | Status: DC | PRN

## 2023-07-06 MED ORDER — POTASSIUM PHOSPHATES 15 MMOLE/5ML IV SOLN
20.0000 mmol | Freq: Once | INTRAVENOUS | Status: AC
  Administered 2023-07-06: 20 mmol via INTRAVENOUS
  Filled 2023-07-06: qty 6.67

## 2023-07-06 MED ORDER — ARFORMOTEROL TARTRATE 15 MCG/2ML IN NEBU
15.0000 ug | INHALATION_SOLUTION | Freq: Two times a day (BID) | RESPIRATORY_TRACT | Status: DC
  Administered 2023-07-06 (×2): 15 ug via RESPIRATORY_TRACT
  Filled 2023-07-06 (×2): qty 2

## 2023-07-06 MED ORDER — INSULIN REGULAR(HUMAN) IN NACL 100-0.9 UT/100ML-% IV SOLN
INTRAVENOUS | Status: DC
  Filled 2023-07-06: qty 100

## 2023-07-06 MED ORDER — POLYVINYL ALCOHOL 1.4 % OP SOLN: 1.0000 [drp] | Freq: Four times a day (QID) | OPHTHALMIC | Status: DC | PRN

## 2023-07-06 MED ORDER — SODIUM CHLORIDE 0.9% FLUSH: 3.0000 mL | INTRAVENOUS | Status: DC | PRN

## 2023-07-06 MED ORDER — ORAL CARE MOUTH RINSE
15.0000 mL | OROMUCOSAL | Status: DC
  Administered 2023-07-06 (×4): 15 mL via OROMUCOSAL

## 2023-07-07 ENCOUNTER — Encounter (HOSPITAL_COMMUNITY): Payer: Self-pay | Admitting: Gastroenterology

## 2023-07-11 LAB — CULTURE, BLOOD (ROUTINE X 2)
Culture: NO GROWTH
Culture: NO GROWTH

## 2023-07-29 NOTE — Progress Notes (Signed)
 Couldn't collect ABG RT x2 no success

## 2023-07-29 NOTE — Procedures (Signed)
 Central Venous Catheter Insertion Procedure Note  Katherine Floyd  [id number]  1946/01/30  Date:07/10/2023  Time:1:37 PM   Provider Performing:Epifanio Labrador Erby Pian   Procedure: Insertion of Non-tunneled Central Venous [phone]) with US guidance ([id number])   Indication(s) Medication administration  Consent Risks of the procedure as well as the alternatives and risks of each were explained to the patient and/or caregiver.  Consent for the procedure was obtained and is signed in the bedside chart  Anesthesia Topical only with 1% lidocaine   Timeout Verified patient identification, verified procedure, site/side was marked, verified correct patient position, special equipment/implants available, medications/allergies/relevant history reviewed, required imaging and test results available.  Sterile Technique Maximal sterile technique including full sterile barrier drape, hand hygiene, sterile gown, sterile gloves, mask, hair covering, sterile ultrasound probe cover (if used).  Procedure Description Area of catheter insertion was cleaned with chlorhexidine and draped in sterile fashion.  With real-time ultrasound guidance a central venous catheter was placed into the left internal jugular vein. Nonpulsatile blood flow and easy flushing noted in all ports.  The catheter was sutured in place and sterile dressing applied.  Complications/Tolerance None; patient tolerated the procedure well. Chest X-ray is ordered to verify placement for internal jugular or subclavian cannulation.   Chest x-ray is not ordered for femoral cannulation.  EBL Minimal  Specimen(s) None

## 2023-07-29 NOTE — Significant Event (Signed)
 Rapid Response Event Note   Reason for Call :  Lethargy and altered mental status.  Initial Focused Assessment:  RNs at bedside, had just assisted moving the patient from the commode back to the bed. Patient more lethargic than the beginning of the shift. Alert, oriented to self and place, was alert and oriented x 4 at beginning of the shift. Stomach is distended, tender, bowel sounds heard in all quadrants except left lower. Patient states her stomach feels the same that it has. NG is to suction, do not hear any air movement when flushed. Moves all extremities equally. Lung sounds clear and diminished to auscultation. Patient has a new productive congested cough.   BP- 111/86 HR 151 O2 89% Temp 98.4  Interventions:  CBG 277 4L [place]  NG manipulated but ultimately removed and reinserted  ABD xray Labs- lactic acid, CMP, CBC lactated ringer bolus over 2 hours CT ABD w contrast  Plan of Care:  Await lab and CT results, really to MD. Continue to monitor. Call RR if any additional help is needed.    Event Summary:   MD Notified: 23:48 Call Time: 23:26 Arrival Time: 23:28 End Time: 00:30  Nechama Guard, RN

## 2023-07-29 NOTE — Procedures (Signed)
 Intubation Procedure Note  GARNETT REKOWSKI  [id number]  03/06/1946  Date:07/05/2023  Time:1:37 PM   Provider Performing:Sadie Pickar C Katrinka Blazing    Procedure: Intubation (31500)  Indication(s) Respiratory Failure  Consent Unable to obtain consent due to emergent nature of procedure.   Anesthesia Versed   Time Out Verified patient identification, verified procedure, site/side was marked, verified correct patient position, special equipment/implants available, medications/allergies/relevant history reviewed, required imaging and test results available.   Sterile Technique Usual hand hygeine, masks, and gloves were used   Procedure Description Patient positioned in bed supine.  Sedation given as noted above.  Patient was intubated with endotracheal tube using Glidescope.  View was Grade 1 full glottis .  Number of attempts was 1.  Colorimetric CO2 detector was consistent with tracheal placement.   Complications/Tolerance None; patient tolerated the procedure well. Chest X-ray is ordered to verify placement.   EBL Minimal   Specimen(s) None

## 2023-07-29 NOTE — Progress Notes (Signed)
 Pharmacy Antibiotic Note  Katherine Floyd is a 78 y.o. female admitted on 07/03/2023 with bowel obstruction now with concern intra-abdominal infection. CTA of ab/pelv pending. Noted intolerance to penicillin as rash with low severity but has tolerated cephalosporins. Pharmacy has been consulted for zosyn dosing.  Plan: Zosyn 2.25g q8h  F/u renal function, infectious work up and length of therapy  Height: 4\' 11"  (149.9 cm) Weight: 44 kg (97 lb) IBW/kg (Calculated) : 43.2  Temp (24hrs), Avg:98 F (36.7 C), Min:97.7 F (36.5 C), Max:98.6 F (37 C)  Recent Labs  Lab 07/03/23 1030 07/04/23 0955 07/05/23 0717 06/29/2023 0021 07/13/2023 0317  WBC 7.8 7.7 9.8 9.2 13.1*  CREATININE 1.08* 1.10* 1.45* 1.94* 1.81*  LATICACIDVEN  --   --   --  4.1* 3.6*    Estimated Creatinine Clearance: 17.2 mL/min (A) (by C-G formula based on SCr of 1.81 mg/dL (H)).    Allergies  Allergen Reactions   Elemental Sulfur Itching   Linaclotide Other (See Comments)    Other reaction(s): Other (See Comments)  Diarrhea   Patient dose not recognize   Prednisone Other (See Comments)    Patient states that 20 mgs of prednisone feel loopy.   Dicyclomine Rash   Doxycycline Rash   Penicillins Rash    Thank you for allowing pharmacy to be a part of this patient's care.  Marja Kays 07/15/2023 4:15 AM

## 2023-07-29 NOTE — Progress Notes (Addendum)
 Family decided to go comfort care with patient CCM notified and orders placed. All questions answered from family. RT to bedside to remove ETT. Morphine started for comfort of patient.    Pt TOD August 02, 2129 pronounced by Arlana Pouch and [facility] RN. MD Paliwal notified.

## 2023-07-29 NOTE — Progress Notes (Signed)
 Dr. Katrinka Blazing gave order to titrate levophed drip based off MAP of arterial line for MAP > 65.

## 2023-07-29 NOTE — Death Summary Note (Signed)
 DEATH SUMMARY   Patient Details  Name: Katherine Floyd MRN: [id number] DOB: Aug 27, 1945  Admission/Discharge Information   Admit Date:  07/17/2023  Date of Death: Date of Death: 07/20/2023  Time of Death: Time of Death: Jul 27, 2129  Length of Stay: 1  Referring Physician: Richmond Campbell., PA-C    Diagnoses  Preliminary cause of death: diffuse intestinal ischemia from large bowel obstruction presumably secondary to anal stricture Secondary Diagnoses (including complications and co-morbidities):  Principal Problem:   Bowel obstruction (HCC) Active Problems:   HTN (hypertension)   Chronic heart failure with preserved ejection fraction (HFpEF) (HCC)   Hypokalemia   Paroxysmal atrial fibrillation (HCC)   Bile duct abnormality   Colon distention   Obstipation   Anal fissure   Colonic obstruction (HCC)   Fecal impaction in rectum (HCC)   Aspiration pneumonia [facility])   Brief Hospital Course (including significant findings, care, treatment, and services provided and events leading to death)  78 year old woman w/ hx of PAF not on AC, chronic constipation, prior SBO who is presenting with abdominal pain. Workup has revealed profound ileus refractory to temporizing measures. This evening worsening abd pain, worsening resp/mental status so brought to ICU.   In ICU, 2023-07-20: Emergently intubated and started on pressors. GI performed decompressing sigmoidoscopy past anal stricture but intestinal walls already looked ischemic. Escalating pressors post procedure even as colon was deompressed, patient went into worsening profound multiorgan failure and would not survive OR. Family discussion held and patient transitioned to comfort.   Pertinent Labs and Studies  Significant Diagnostic Studies DG Abd Portable 1V Result Date: Jul 20, 2023 CLINICAL DATA:  Ileus EXAM: PORTABLE ABDOMEN - 1 VIEW COMPARISON:  CT 07-20-2023. FINDINGS: Rectal tube in place. Radiopaque densities seen overlying the tube as well  which were seen in the rectum on the prior CT scan. Level of colonic dilatation appears decreased comparing the examinations with placement of the presumed rectal tube. Continued dilated air-filled loops of small and large bowel. Small bowel dilated again up to close to 5 cm. Please correlate with CT scan for possible obstruction or ileus. The diaphragm is clipped off the edge of the film. Surgical clips. Hernia mesh. IMPRESSION: Presumed interval placement of a rectal tube with decreasing colonic dilatation with residual. Continued moderate to severe dilatation of small bowel. Electronically Signed   By: Karen Kays M.D.   On: 2023-07-20 09:53   DG Chest Port 1 View Result Date: 2023-07-20 CLINICAL DATA:  78 year old female intubated. Abdominal distension. Bilateral pneumonia, bronchopneumonia on CT Abdomen and Pelvis today. EXAM: PORTABLE CHEST 1 VIEW COMPARISON:  Chest radiographs 06/01/2018. FINDINGS: Enteric tube terminates in the left upper quadrant, satisfactory. Endotracheal tube tip at the level the clavicles. Considerably lower lung volumes compared to 2020. Left chest vascular catheter is new since 07-28-2018, terminates at the level of the right atrium. Coarse bilateral basilar predominant pulmonary opacity corresponding to abnormal lung findings on CT 0332 hours today. No superimposed pneumothorax, pulmonary edema, pleural effusion. Stable cardiac size and mediastinal contours. Calcified aortic atherosclerosis. Diffuse bowel gas appears stable from the earlier CT Abdomen and Pelvis scout view. No acute osseous abnormality identified. IMPRESSION: 1. Satisfactory ET tube and enteric tube placement. 2. Left chest vascular catheter terminates at the level of the right atrium, retract 1 cm for superior cavoatrial junction level placement. No pneumothorax. 3. Lower lung volumes from priors, with widespread coarse bilateral basilar predominant lung opacity corresponding to bilateral pneumonia/bronchopneumonia on  CT 0332 hours this morning. 4. Abnormally  increased bowel gas appears stable from the earlier CT Abdomen and Pelvis scout view. Electronically Signed   By: Odessa Fleming M.D.   On: 07/02/2023 08:22   CT ABDOMEN PELVIS WO CONTRAST Result Date: 07/21/2023 CLINICAL DATA:  Abdominal distention and pain. EXAM: CT ABDOMEN AND PELVIS WITHOUT CONTRAST TECHNIQUE: Multidetector CT imaging of the abdomen and pelvis was performed following the standard protocol without IV contrast. RADIATION DOSE REDUCTION: This exam was performed according to the departmental dose-optimization program which includes automated exposure control, adjustment of the mA and/or kV according to patient size and/or use of iterative reconstruction technique. COMPARISON:  CTs of abdomen and pelvis with contrast dated 07/03/2023 and 06/13/2023. FINDINGS: Lower chest: Interval worsening nodular and patchy airspace disease, including scattered tree-in-bud micronodularity, in the right upper, middle and both lower lobes consistent with multilobar pneumonia. There is diffuse lower lobe bronchial thickening scattered subsegmental bronchial plugging in the basal segments. Aspiration component is likely. Recommend aspiration precautions unless already being done. Interval NGT insertion. Small increased hiatal hernia with fluid level and increased fluid in the herniated portion. The cardiac size is normal. There are left main and 2 vessel coronary calcifications in the LAD and circumflex. Scattered calcification across the aortic valve leaflets with valve leaflet thickening. Hepatobiliary: There are tiny stones layering in the gallbladder. Mild nonspecific intrahepatic and extrahepatic biliary prominence appear similar. The unenhanced liver is unremarkable. Pancreas: Unremarkable without contrast. Spleen: Unremarkable without contrast.  No splenomegaly. Adrenals/Urinary Tract: No focal abnormality of the adrenal glands and kidneys. Unremarkable bladder. No  hydronephrosis or stone disease. Stomach/Bowel: NGT in place since March 7. The tube loops around to the left in the stomach with the tip the proximal fundus. The stomach is relatively decompressed. No duodenal dilatation. There is diffuse dilatation of small bowel 4.7 cm. Old right hemicolectomy. There is diffuse decrease in formed stool in the dilated colon. There is diffuse dilatation of the colon. There is increased fluid in the colon where formed stool was previously, with considerable gas distention as well. The proximal transverse colon measures 11 cm today, previously 10 cm. The descending colon is 9.6 cm today, previously 7.5 cm. The rectum is also increasingly dilated now 11 cm was previously 10 cm. Ingested tablets are noted in the descending colon and rectum and were present previously as well, not notably changed in positioning despite the reduction in formed stool. These were also seen on 06/13/2023. No bowel pneumatosis is seen and no overt bowel thickening. Vascular/Lymphatic: Aortic atherosclerosis. No enlarged abdominal or pelvic lymph nodes. Reproductive: Status post hysterectomy. No adnexal masses. Other: There is slight body wall edema with improvement. No free fluid or free air. Postsurgical changes anterior abdominal wall. There previously was subhepatic ascites which is not seen today. Musculoskeletal: Degenerative and postsurgical changes of the spine as described previously. Chronic wedging of L1. No acute or significant osseous findings. IMPRESSION: 1. Interval worsening multilobar pneumonia with lower lobe bronchial thickening and scattered subsegmental bronchial plugging. Aspiration component is likely. Recommend aspiration precautions unless already being done. 2. Interval NGT insertion. 3. Increased fluid in the colon where formed stool was previously, with diffuse decrease in formed stool, diffuse dilatation of small and large bowel slightly worsening. Findings are consistent with  adynamic ileus or bowel obstruction at the level of the anorectal junction. 4. Increased fluid in the small hiatal hernia. 5. Cholelithiasis. 6. Aortic and coronary artery atherosclerosis. 7. Improved body wall edema. Aortic Atherosclerosis (ICD10-I70.0). Electronically Signed   By: Almira Bar  M.D.   On: 06/29/2023 05:09   DG Abd 1 View Result Date: 07/02/2023 CLINICAL DATA:  NG tube EXAM: ABDOMEN - 1 VIEW COMPARISON:  Abdominal x-ray 07/05/2023 FINDINGS: Nasogastric tube tip is now in the gastric fundus. Dilated small bowel loops are unchanged. IMPRESSION: Nasogastric tube tip is now in the gastric fundus. Electronically Signed   By: Darliss Cheney M.D.   On: 06/30/2023 00:48   DG Abd 1 View Result Date: 07/05/2023 CLINICAL DATA:  [id number] Encounter for nasogastric tube placement [id number] EXAM: ABDOMEN - 1 VIEW COMPARISON:  Abdominal radiograph from earlier today FINDINGS: Enteric tube tip and side port in the proximal stomach. Hernia repair mesh overlies the central and upper abdomen. Diffuse mild-to-moderate small bowel dilatation in the visualized upper abdomen, improved. Diffuse colonic gas appears similar. No definite pneumatosis or pneumoperitoneum. IMPRESSION: 1. Enteric tube tip and side port in the proximal stomach. 2. Diffuse mild-to-moderate small bowel dilatation in the visualized upper abdomen and diffuse colonic gas, improved. Electronically Signed   By: Delbert Phenix M.D.   On: 07/05/2023 14:19   DG Abd 2 Views Result Date: 07/05/2023 CLINICAL DATA:  Constipation.  Abdominal pain and distention. EXAM: ABDOMEN - 2 VIEW COMPARISON:  July 04, 2023. FINDINGS: Dilated large and small bowel loops are noted with air-fluid levels concerning for distal bowel obstruction. Status post hernia repair. Large amount of probable tablets are seen projected over the pelvis which may be external; clinical correlation is recommended. IMPRESSION: Dilated large and small bowel loops are noted with air-fluid levels  concerning for distal bowel obstruction. There appears to be multiple tablets projected over the pelvis which may be external; clinical correlation is recommended to rule out large amount of ingested tablets. Electronically Signed   By: Lupita Raider M.D.   On: 07/05/2023 13:42   DG Abd Portable 1V Result Date: 07/04/2023 CLINICAL DATA:  Feeding tube placement. EXAM: PORTABLE ABDOMEN - 1 VIEW COMPARISON:  CT yesterday FINDINGS: Tip and side port of the enteric tube below the diaphragm in the stomach. There is diffuse bowel distension similar to yesterday's CT. IMPRESSION: Tip and side port of the enteric tube below the diaphragm in the stomach. Electronically Signed   By: Narda Rutherford M.D.   On: 07/04/2023 18:14   CT ABDOMEN PELVIS W CONTRAST Result Date: 07/03/2023 CLINICAL DATA:  Bloating a watery stools for 2 weeks. Possible obstruction. EXAM: CT ABDOMEN AND PELVIS WITH CONTRAST TECHNIQUE: Multidetector CT imaging of the abdomen and pelvis was performed using the standard protocol following bolus administration of intravenous contrast. RADIATION DOSE REDUCTION: This exam was performed according to the departmental dose-optimization program which includes automated exposure control, adjustment of the mA and/or kV according to patient size and/or use of iterative reconstruction technique. CONTRAST:  75mL OMNIPAQUE IOHEXOL 350 MG/ML SOLN COMPARISON:  CT 06/13/2023. older CT MRI exams as well. FINDINGS: Lower chest: There is some linear opacity seen at the left lower lobe greater than lingula and right lower lobe, areas of scar atelectatic change. Similar distribution to previous. The opacity left lower lobe appears increased. No significant pleural effusion. Coronary artery calcifications are seen. Small hiatal hernia. Hepatobiliary: Stones seen in the nondilated urinary bladder. There is some ectasia of the biliary tree which appears slightly more prominent today than previous. Normal tapering is not as  gradual as expected. Slight intrahepatic biliary duct ectasia as well. No space-occupying liver lesion. Patent portal vein. Pancreas: Unremarkable. No pancreatic ductal dilatation or surrounding inflammatory changes. Spleen: Normal in  size without focal abnormality. Adrenals/Urinary Tract: The adrenal glands are preserved. No enhancing renal mass or collecting system dilatation. The ureters have a normal course and caliber extending down to the urinary bladder. Preserved contour to the urinary bladder. Stomach/Bowel: No oral contrast. The stomach is nondilated. Duodenum is relatively collapsed as well. The colon is dilated insert lay areas with areas of mild wall/fold thickening. Some loops are dilated up to 10.5 cm such as along the rectum. There is a very large amount of stool in the rectum as well as impacted stool suggested throughout the redundant sigmoid colon with few air-fluid levels. Also significant stool along the descending colon. There are stool fluid levels along the transverse colon which are dilated as well up to 9.8 cm. Surgical changes along the right side of the colon. In addition there is some dilated loops of small bowel mid abdomen with some air-fluid levels measuring up to 4.2 cm. Few areas of high attenuation debris along the colon which could be ingested contents. No areas of pneumatosis. No portal venous gas suggested. Vascular/Lymphatic: Diffuse vascular calcifications along the aorta and branch vessels. Normal caliber aorta and IVC. No discrete abnormal lymph node enlargement identified in the abdomen and pelvis. Reproductive: Status post hysterectomy. No adnexal masses. Other: Anasarca. Presumed mesh along the anterior abdominal wall for hernia repair. Few areas of free fluid particularly along the right side of the abdomen, adjacent to liver and pericolic gutter. Increased. Musculoskeletal: Osteopenia. Degenerative changes seen spine pelvis. Changes of previous right hemilaminectomy at  L5. Multilevel disc bulging. Stable compression deformity of the superior endplate of L1 with Schmorl's node. Canal stenosis seen particularly at L4-5. IMPRESSION: Extensive colonic stool identified with dilatation of the colon measuring up to 10.5 cm. Several air-fluid levels as well in areas less impacted by stool. Please correlate for any evidence of a distal stenosis at the anorectal region with these changes and chronicity. Several mildly dilated loops of small bowel as well with air-fluid levels. This could be secondary to the colonic process but would recommend follow-up. Slight increase in trace ascites. Stones in the gallbladder. There is mild biliary duct dilatation which is developing from the prior examination. There is some tapering of the distal common duct but not smoothly as expected. With the interval change, an ampullary lesion is difficult to completely exclude. Please correlate with any clinical presentation and prior workup. Additional evaluation as clinically appropriate Surgical changes identified along the right side of the colon. Spine surgery as well. Anterior abdominal wall mesh. Electronically Signed   By: Karen Kays M.D.   On: 07/03/2023 15:05   CT ABDOMEN PELVIS W CONTRAST Result Date: 06/13/2023 CLINICAL DATA:  Acute abdominal pain EXAM: CT ABDOMEN AND PELVIS WITH CONTRAST TECHNIQUE: Multidetector CT imaging of the abdomen and pelvis was performed using the standard protocol following bolus administration of intravenous contrast. RADIATION DOSE REDUCTION: This exam was performed according to the departmental dose-optimization program which includes automated exposure control, adjustment of the mA and/or kV according to patient size and/or use of iterative reconstruction technique. CONTRAST:  OMNIPAQUE IOHEXOL 300 MG/ML  SOLN COMPARISON:  03/06/2023 FINDINGS: Lower chest: No acute abnormality. Hepatobiliary: Liver is within normal limits. Gallbladder is distended with  dependent gallstones stable from the prior exam. Previously seen common bile duct dilatation has resolved in the interval. Pancreas: Unremarkable. No pancreatic ductal dilatation or surrounding inflammatory changes. Spleen: Normal in size without focal abnormality. Adrenals/Urinary Tract: Adrenal glands are within normal limits. Kidneys demonstrate a  normal enhancement pattern bilaterally. Normal excretion is noted bilaterally. No calculi are seen. The bladder is decompressed. Stomach/Bowel: Considerable retained fecal material is noted within the rectosigmoid increased when compared with the prior exam. Multiple ingested tablets are noted within the rectum. Changes consistent with prior right hemicolectomy are noted. Gaseous distension of the proximal colon is noted. Stomach and small bowel are within normal limits. Vascular/Lymphatic: Aortic atherosclerosis. No enlarged abdominal or pelvic lymph nodes. Reproductive: Status post hysterectomy. No adnexal masses. Other: No abdominal wall hernia or abnormality. No abdominopelvic ascites. Musculoskeletal: No acute or significant osseous findings. Postsurgical changes in the anterior abdominal wall are again noted. IMPRESSION: Colonic distension with air and fecal material as described. This may represent a colonic ileus. Cholelithiasis without complicating factors. Resolution of previously seen biliary ductal dilatation. Electronically Signed   By: Alcide Clever M.D.   On: 06/13/2023 22:14    Microbiology Recent Results (from the past 240 hours)  Culture, blood (Routine X 2) w Reflex to ID Panel     Status: None (Preliminary result)   Collection Time: 07/24/2023 11:41 AM   Specimen: BLOOD LEFT ARM  Result Value Ref Range Status   Specimen Description BLOOD LEFT ARM  Final   Special Requests   Final    BOTTLES DRAWN AEROBIC ONLY Blood Culture results may not be optimal due to an inadequate volume of blood received in culture bottles   Culture   Final    NO  GROWTH 3 DAYS Performed at [facility], [address] [place], [place] [address]    Report Status PENDING  Incomplete  Culture, blood (Routine X 2) w Reflex to ID Panel     Status: None (Preliminary result)   Collection Time: 07/15/2023 11:45 AM   Specimen: BLOOD LEFT ARM  Result Value Ref Range Status   Specimen Description BLOOD LEFT ARM  Final   Special Requests   Final    BOTTLES DRAWN AEROBIC ONLY Blood Culture results may not be optimal due to an inadequate volume of blood received in culture bottles   Culture   Final    NO GROWTH 3 DAYS Performed at [facility], [address] [place], [place] [address]    Report Status PENDING  Incomplete  MRSA Next Gen by PCR, Nasal     Status: None   Collection Time: 07/01/2023  1:17 PM   Specimen: Nasal Mucosa; Nasal Swab  Result Value Ref Range Status   MRSA by PCR Next Gen NOT DETECTED NOT DETECTED Final    Comment: (NOTE) The GeneXpert MRSA Assay (FDA approved for NASAL specimens only), is one component of a comprehensive MRSA colonization surveillance program. It is not intended to diagnose MRSA infection nor to guide or monitor treatment for MRSA infections. Test performance is not FDA approved in patients less than 24 years old. Performed at [facility], [address] [place], [place] [address]     Lab Basic Metabolic Panel: Recent Labs  Lab 07/04/23 [phone] 07/05/23 0717 07/27/2023 0021 07/08/2023 0317 07/19/2023 0742 07/18/2023 1021 07/20/2023 1023 07/27/2023 1650  NA 142 147* 157* 161* 156* 150* 150* 144  K 3.5 3.8 3.7 3.8 3.4* 3.8 3.9 4.2  CL 104 106 112* 117*  --   --  112*  --   CO2 23 21* 19* 20*  --   --  14*  --   GLUCOSE 80 81 289* 244*  --   --  312*  --  BUN 9 15 26* 28*  --   --  27*  --   CREATININE 1.10* 1.45* 1.94* 1.81*  --   --  2.16*  --   CALCIUM 9.2 9.4 10.7* 11.2*  --   --  9.6  --   MG  --  2.1  --  3.2*  --   --   --   --   PHOS  --  4.4  --  1.9*  --   --   --   --    Liver  Function Tests: Recent Labs  Lab 07/03/23 1030 07/04/23 0955 07/05/23 0717 07/24/2023 0021 07/12/2023 0317  AST 22 19 19 27 25   ALT 18 16 16 22 20   ALKPHOS 55 57 54 64 62  BILITOT 0.7 1.3* 2.1* 1.8* 1.1  PROT 6.2* 6.1* 5.9* 7.0 6.6  ALBUMIN 3.6 3.5 3.3* 4.0 3.8   Recent Labs  Lab 07/03/23 1030  LIPASE 60*   No results for input(s): "AMMONIA" in the last 168 hours. CBC: Recent Labs  Lab 07/04/23 0955 07/05/23 0717 07/23/2023 0021 07/01/2023 0317 07/05/2023 0742 07/22/2023 1021 07/27/2023 1023 07/15/2023 1650 07/10/2023 1830  WBC 7.7 9.8 9.2 13.1*  --   --  14.1*  --  9.3  NEUTROABS 5.2 7.5 7.2 10.9*  --   --   --   --   --   HGB 12.5 12.9 14.9 15.4* 13.3 13.9 13.5 13.9 14.6  HCT 37.6 38.5 44.7 46.1* 39.0 41.0 45.1 41.0 45.6  MCV 90.4 89.5 89.0 89.7  --   --  99.6  --  93.6  PLT 243 275 420* 416*  --   --  270  --  219   Cardiac Enzymes: No results for input(s): "CKTOTAL", "CKMB", "CKMBINDEX", "TROPONINI" in the last 168 hours. Sepsis Labs: Recent Labs  Lab 06/30/2023 0021 07/04/2023 0317 07/09/2023 1023 07/26/2023 1408 06/28/2023 1830  WBC 9.2 13.1* 14.1*  --  9.3  LATICACIDVEN 4.1* 3.6* >9.0* 8.6* 7.6*      Lorin Glass 07/09/2023, 3:26 PM

## 2023-07-29 NOTE — Progress Notes (Signed)
 Stopped by to reassess patient. I have spoke with Dr. Katrinka Blazing and Dr. Dwain Sarna multiple times about this patient this AM. S/p endoscopic decompression which I think helped her left colon but transverse / right colon and small intestine remained dilated. Descending colon was starting to look ischemic on flex sig, I did not attempt to traverse that part of her colon and tried to decompress as best we could and placed a rectal tube. I spoke with Dr Loreta Ave her primary GI MD to get last colonoscopy report which showed NO colonic stenosis, only anal canal stenosis.   We had discussed options. She is very high surgical risk for diffuse ileus, may not survive an operation like that. We discussed neostigmine, she got a dose of that and nursing reports she has had a lot of stool output. Her abdomen is definitely less distended than this AM and seems improved, but her lactate remains high and on pressors, intubated. Suspect she has had ischemic bowel from the distension, probably more so right colon or transverse, hopefully the neostigmine has helped. Continue supportive measures. Call with questions. Dr. Loreta Ave to assume her care in the AM.   Harlin Rain, MD [facility]

## 2023-07-29 NOTE — Progress Notes (Signed)
 07/09/2023 Escalating pressors, high lactate; agree with GI probable intestinal ischemia; having good stool output with neostigmine trial; not an operative candidate with current clinical picture and nutritional status, would pass away on table.  Have maximized mechanical ventilation, given methylene blue, bicarb etc.  Will relax family visitation, I have told family I am worried she will not live through night.  If continued deterioration, should discuss consideration for comfort care.  Additional CC time 50 mins Myrla Halsted MD PCCM

## 2023-07-29 NOTE — Progress Notes (Signed)
 Progress Note   Subjective  Patient yesterday had an enema per nursing and reportedly had a large bowel movement. After that she had declined a rectal tube. Her xray yesterday PM actually looked a bit better. Unfortunately developed rapid response overnight, concern for aspiration. NG placed Intubated per ICU team this AM. Lactic acidosis. CT scan done as outlined - colonic distension is a bit worse, increase in "fluid" of the colon, new rectal distension.   Objective   Vital signs in last 24 hours: Temp:  [97.7 F (36.5 C)-97.8 F (36.6 C)] 97.8 F (36.6 C) (03/09 0346) Pulse Rate:  [98-145] 129 (03/09 0402) Resp:  [18-31] 30 (03/09 0721) BP: (83-147)/(61-121) 83/61 (03/09 0721) SpO2:  [94 %-100 %] 100 % (03/09 0721) FiO2 (%):  [100 %] 100 % (03/09 0721) Last BM Date : 07/05/23 General:    white female intubated in the ICU Abdomen:  markedly distended, tense. DRE - anal canal stenosis, can only pass 5th digit - no impaction palpated but significant gas released and passage of liquid stool with DRE  Intake/Output from previous day: 03/08 0701 - 03/09 0700 In: 672 [P.O.:672] Out: 100 [Emesis/NG output:100] Intake/Output this shift: No intake/output data recorded.  Lab Results: Recent Labs    07/05/23 0717 06/29/2023 0021 07/26/2023 0317 07/09/2023 0742  WBC 9.8 9.2 13.1*  --   HGB 12.9 14.9 15.4* 13.3  HCT 38.5 44.7 46.1* 39.0  PLT 275 420* 416*  --    BMET Recent Labs    07/05/23 0717 07/25/2023 0021 07/22/2023 0317 07/22/2023 0742  NA 147* 157* 161* 156*  K 3.8 3.7 3.8 3.4*  CL 106 112* 117*  --   CO2 21* 19* 20*  --   GLUCOSE 81 289* 244*  --   BUN 15 26* 28*  --   CREATININE 1.45* 1.94* 1.81*  --   CALCIUM 9.4 10.7* 11.2*  --    LFT Recent Labs    07/24/2023 0317  PROT 6.6  ALBUMIN 3.8  AST 25  ALT 20  ALKPHOS 62  BILITOT 1.1   PT/INR No results for input(s): "LABPROT", "INR" in the last 72 hours.  Studies/Results: CT ABDOMEN PELVIS WO  CONTRAST Result Date: 07/22/2023 CLINICAL DATA:  Abdominal distention and pain. EXAM: CT ABDOMEN AND PELVIS WITHOUT CONTRAST TECHNIQUE: Multidetector CT imaging of the abdomen and pelvis was performed following the standard protocol without IV contrast. RADIATION DOSE REDUCTION: This exam was performed according to the departmental dose-optimization program which includes automated exposure control, adjustment of the mA and/or kV according to patient size and/or use of iterative reconstruction technique. COMPARISON:  CTs of abdomen and pelvis with contrast dated 07/03/2023 and 06/13/2023. FINDINGS: Lower chest: Interval worsening nodular and patchy airspace disease, including scattered tree-in-bud micronodularity, in the right upper, middle and both lower lobes consistent with multilobar pneumonia. There is diffuse lower lobe bronchial thickening scattered subsegmental bronchial plugging in the basal segments. Aspiration component is likely. Recommend aspiration precautions unless already being done. Interval NGT insertion. Small increased hiatal hernia with fluid level and increased fluid in the herniated portion. The cardiac size is normal. There are left main and 2 vessel coronary calcifications in the LAD and circumflex. Scattered calcification across the aortic valve leaflets with valve leaflet thickening. Hepatobiliary: There are tiny stones layering in the gallbladder. Mild nonspecific intrahepatic and extrahepatic biliary prominence appear similar. The unenhanced liver is unremarkable. Pancreas: Unremarkable without contrast. Spleen: Unremarkable without contrast.  No splenomegaly. Adrenals/Urinary Tract: No  focal abnormality of the adrenal glands and kidneys. Unremarkable bladder. No hydronephrosis or stone disease. Stomach/Bowel: NGT in place since March 7. The tube loops around to the left in the stomach with the tip the proximal fundus. The stomach is relatively decompressed. No duodenal dilatation.  There is diffuse dilatation of small bowel 4.7 cm. Old right hemicolectomy. There is diffuse decrease in formed stool in the dilated colon. There is diffuse dilatation of the colon. There is increased fluid in the colon where formed stool was previously, with considerable gas distention as well. The proximal transverse colon measures 11 cm today, previously 10 cm. The descending colon is 9.6 cm today, previously 7.5 cm. The rectum is also increasingly dilated now 11 cm was previously 10 cm. Ingested tablets are noted in the descending colon and rectum and were present previously as well, not notably changed in positioning despite the reduction in formed stool. These were also seen on 06/13/2023. No bowel pneumatosis is seen and no overt bowel thickening. Vascular/Lymphatic: Aortic atherosclerosis. No enlarged abdominal or pelvic lymph nodes. Reproductive: Status post hysterectomy. No adnexal masses. Other: There is slight body wall edema with improvement. No free fluid or free air. Postsurgical changes anterior abdominal wall. There previously was subhepatic ascites which is not seen today. Musculoskeletal: Degenerative and postsurgical changes of the spine as described previously. Chronic wedging of L1. No acute or significant osseous findings. IMPRESSION: 1. Interval worsening multilobar pneumonia with lower lobe bronchial thickening and scattered subsegmental bronchial plugging. Aspiration component is likely. Recommend aspiration precautions unless already being done. 2. Interval NGT insertion. 3. Increased fluid in the colon where formed stool was previously, with diffuse decrease in formed stool, diffuse dilatation of small and large bowel slightly worsening. Findings are consistent with adynamic ileus or bowel obstruction at the level of the anorectal junction. 4. Increased fluid in the small hiatal hernia. 5. Cholelithiasis. 6. Aortic and coronary artery atherosclerosis. 7. Improved body wall edema. Aortic  Atherosclerosis (ICD10-I70.0). Electronically Signed   By: Almira Bar M.D.   On: 06/28/2023 05:09   DG Abd 1 View Result Date: 07/05/2023 CLINICAL DATA:  NG tube EXAM: ABDOMEN - 1 VIEW COMPARISON:  Abdominal x-ray 07/05/2023 FINDINGS: Nasogastric tube tip is now in the gastric fundus. Dilated small bowel loops are unchanged. IMPRESSION: Nasogastric tube tip is now in the gastric fundus. Electronically Signed   By: Darliss Cheney M.D.   On: 06/30/2023 00:48   DG Abd 1 View Result Date: 07/05/2023 CLINICAL DATA:  [id number] Encounter for nasogastric tube placement [id number] EXAM: ABDOMEN - 1 VIEW COMPARISON:  Abdominal radiograph from earlier today FINDINGS: Enteric tube tip and side port in the proximal stomach. Hernia repair mesh overlies the central and upper abdomen. Diffuse mild-to-moderate small bowel dilatation in the visualized upper abdomen, improved. Diffuse colonic gas appears similar. No definite pneumatosis or pneumoperitoneum. IMPRESSION: 1. Enteric tube tip and side port in the proximal stomach. 2. Diffuse mild-to-moderate small bowel dilatation in the visualized upper abdomen and diffuse colonic gas, improved. Electronically Signed   By: Delbert Phenix M.D.   On: 07/05/2023 14:19   DG Abd 2 Views Result Date: 07/05/2023 CLINICAL DATA:  Constipation.  Abdominal pain and distention. EXAM: ABDOMEN - 2 VIEW COMPARISON:  July 04, 2023. FINDINGS: Dilated large and small bowel loops are noted with air-fluid levels concerning for distal bowel obstruction. Status post hernia repair. Large amount of probable tablets are seen projected over the pelvis which may be external; clinical correlation is recommended. IMPRESSION: Dilated  large and small bowel loops are noted with air-fluid levels concerning for distal bowel obstruction. There appears to be multiple tablets projected over the pelvis which may be external; clinical correlation is recommended to rule out large amount of ingested tablets. Electronically  Signed   By: Lupita Raider M.D.   On: 07/05/2023 13:42   DG Abd Portable 1V Result Date: 07/04/2023 CLINICAL DATA:  Feeding tube placement. EXAM: PORTABLE ABDOMEN - 1 VIEW COMPARISON:  CT yesterday FINDINGS: Tip and side port of the enteric tube below the diaphragm in the stomach. There is diffuse bowel distension similar to yesterday's CT. IMPRESSION: Tip and side port of the enteric tube below the diaphragm in the stomach. Electronically Signed   By: Narda Rutherford M.D.   On: 07/04/2023 18:14       Assessment / Plan:    78 y/o female here with the following:  Abdominal distension / obstipation / ileus Anal canal stenosis Anal Fissure  Enema yesterday had good stool production and her xray looked slightly better. She declined a rectal tube yesterday PM. NG was replaced. Old pills noted in her rectum on multiple prior imaging studies raised question of bezoar. No impaction on DRE yesterday but she does have a significant anal canal stenosis. Plan yesterday was for flex sig today. Of note she appears to have had a colonoscopy within the past year but I we don't have access to that report (Dr. Loreta Ave / Elnoria Howard patient).   Unfortunately she got much worse overnight. Aspirated with pneumonia, now intubated in the ICU with lactic acidosis. Her abdominal exam has worsened, she seems much more distended than yesterday. CT shows progression of colonic dilation and small bowel dilation although stool appears more liquid. Adynamic ileus vs. anorectal obstruction. I did a DRE with nursing staff at bedside upon examination - due to anal canal stenosis only 5th digit can be passed, no obvious impaction appreciated but significant gas and some loose stool was passed with the DRE itself.   I called the patient's husband and discussed the situation with him at length, and discussed case with Dr. Katrinka Blazing of critical care. She has significantly deteriorated since I saw her yesterday. Unclear if lactic acidosis is due  to her aspiration PNA or her ileus / distension, or combination of both. I offered them flex sig to assess her rectum, rule out impaction more proximally, and decompress her colon if possible, assess for colonic ischemia. I am concerned if she does have an impaction more proximally, with anal canal stenosis this may be very difficult to treat. If this is not possible or I can't decompress her endoscopically she may need surgical intervention, surgery is aware of her in case this intervention does not help. She is not a good candidate for neostigmine given her underlying asthma and respiratory status.   I have discussed risks / benefits of flex sig with the patient's husband this AM and nurse witnessed consent. Main risk is that of perforation which is possible however without an intervention she is at risk for perforation with her interval worsening. I will try to use water immersion and C02 with minimal air insufflation. She is critically ill. Husband understands this, we will do the best we can to help her, and he wants [place] to proceed.   Further recommendations pending the results.   Harlin Rain, MD [facility]

## 2023-07-29 NOTE — Progress Notes (Signed)
 Notified Dr. Katrinka Blazing and Dr. Adela Lank that patient is having bright red output from NG tube. MDs acknowledged, no new orders at this time.

## 2023-07-29 NOTE — Op Note (Addendum)
 [facility] Patient Name: Katherine Floyd Procedure Date : 06/28/2023 MRN: [id number] Attending MD: Willaim Rayas. Adela Lank , MD, [id number] Date of Birth: 1946/01/18 CSN: [id number] Age: 78 Admit Type: Inpatient Procedure:                Flexible Sigmoidoscopy Indications:              Abnormal CT of the GI tract - ileus, colonic                            dilation, Abdominal distension Providers:                Willaim Rayas. Adela Lank, MD Referring MD:              Medicines:                None Complications:            No immediate complications. Estimated blood loss:                            Minimal. Estimated Blood Loss:     Estimated blood loss was minimal. Procedure:                Pre-Anesthesia Assessment:                           - Prior to the procedure, a History and Physical                            was performed, and patient medications and                            allergies were reviewed. The patient's tolerance of                            previous anesthesia was also reviewed. The risks                            and benefits of the procedure and the sedation                            options and risks were discussed with the patient.                            All questions were answered, and informed consent                            was obtained. Prior Anticoagulants: The patient has                            taken no anticoagulant or antiplatelet agents. ASA                            Grade Assessment: IV - A patient with severe  systemic disease that is a constant threat to life.                            After reviewing the risks and benefits, the patient                            was deemed in satisfactory condition to undergo the                            procedure.                           After obtaining informed consent, the scope was                            passed under direct vision. The [id number]-[id number]  ([id number])                            Olympus endoscope was introduced through the anus                            and advanced to the the descending colon. The                            flexible sigmoidoscopy was technically difficult                            and complex due to inadequate bowel prep. The                            quality of the bowel preparation was poor. Findings:      The digital rectal exam findings include anal stricture. Index finger       could not be passed, 5th digit only - no impaction.      A large amount of semi-liquid stool was found in the entire colon,       making visualization difficult. There is no fecal impaction. Lavage of       the colon was performed using copious amounts of sterile water for       several minutes. Significant stool lavaged so we could eventually see       the lumen but visualization remained fair. Of note I could not see any       retained capsules noted on imaging due to stool burden, no obvious       bezoar but full views of the rectum not obtained.      The lumen of the sigmoid colon and descending colon was significantly       dilated. Initially upper endoscope was used given the patient's anal       canal stenosis. There was looping in the sigmoid colon with this. It was       removed and replaced with a pediatric colonoscope which was able to       traverse the anal stenosis and allowed better suctioning. The suspected       distal decending colon was reached. The mucosa of the distal left colon  looked okay but there were some early ischemic changes noted in the       distal descending colon. Stool burden was too much there to clear and       could not safely get to the traverse / right colon for additional       decompression. Extensive lavage / suctioning done, her abdomen was less       distended / tight than pre procedure but certainly not at baseline and       remains distended from air in the transverse / right  colon and small       bowel. A colonic decompression tube was placed into the suspected       sigmoid colon. Impression:               - Preparation of the colon was poor.                           - Anal stricture found on digital rectal exam.                           - Stool in the entire examined colon. No focal                            impaction in the distal left colon. Extensive                            lavage performed.                           - Suctioned air / stool from the distal left colon                            and left rectal tube in place.                           - Early ischemic change of the descending colon -                            unable to safety traverse this area due to stool                            burden Recommendation:           - Remain in the ICU for ongoing care.                           - NPO                           - NG and rectal tube in place                           - Continue present medications.                           - xray post procedure - (of note rectal tubes can  easily migrate, may need to be replaced at some                            point)                           - await course post decompression. Suspect severe                            ileus is causing this. Consideration for                            neostigmine although given her pulmonary status                            this has risks                           - have discussed results with general surgery. If                            she worsens with conservative measures that would                            be backup plan                           - call with questions. We will follow. Dr. Elnoria Howard to                            resume her inpatient care tomorrow                           UPDATE: Spoke with Dr. Loreta Ave and got prior                            colonoscopy report. There was no colonic stenosis                             on her last exam - done 08/2019 - only anal canal                            stenosis. Procedure Code(s):        --- Professional ---                           [phone], Sigmoidoscopy, flexible; with decompression                            (for pathologic distention) (eg, volvulus,                            megacolon), including placement of decompression  tube, when performed Diagnosis Code(s):        --- Professional ---                           K62.4, Stenosis of anus and rectum                           K59.39, Other megacolon                           R10.9, Unspecified abdominal pain                           R93.3, Abnormal findings on diagnostic imaging of                            other parts of digestive tract CPT copyright 2022 [employer]. All rights reserved. The codes documented in this report are preliminary and upon coder review may  be revised to meet current compliance requirements. Viviann Spare P. Der Gagliano, MD 07/26/2023 9:50:08 AM This report has been signed electronically. Number of Addenda: 0

## 2023-07-29 NOTE — Progress Notes (Signed)
 Dr. Katrinka Blazing at bedside speaking with family and gave order to only titrate levophed to MAP > 65.

## 2023-07-29 NOTE — Progress Notes (Signed)
 07/28/2023   sodium chloride 10 mL/hr at 07/14/2023 1200   cefTRIAXone (ROCEPHIN)  IV 2 g (07/27/2023 1307)   metronidazole     norepinephrine (LEVOPHED) Adult infusion 22 mcg/min (07/08/2023 1200)   potassium PHOSPHATE IVPB (in mmol) 84 mL/hr at 06/30/2023 1200   propofol (DIPRIVAN) infusion 5 mcg/kg/min (06/28/2023 1200)   sodium bicarbonate 150 mEq in sterile water 1,150 mL infusion 100 mL/hr at 07/24/2023 0943   vasopressin 0.03 Units/min (07/27/2023 1200)    Trying subQ neo as no obvious proximal stricture after GI discussion with her primary GI.  Repeat lactate  Guarded prognosis, if ileus progresses to frank intestinal ischemia this is not survivable.  Will update family again soon  Additional cc time 45 mins

## 2023-07-29 NOTE — Consult Note (Addendum)
 NAME:  Katherine Floyd, MRN:  [id number], DOB:  11/23/1945, LOS: 1 ADMISSION DATE:  07/03/2023, CONSULTATION DATE:  07/20/2023 REFERRING MD:  TRH, CHIEF COMPLAINT:  abd pain   History of Present Illness:  78 year old woman w/ hx of PAF not on AC, chronic constipation, prior SBO who is presenting with abdominal pain.  Workup has revealed profound ileus refractory to temporizing measures.  This evening worsening abd pain, worsening resp/mental status so brought to ICU.  Temp plan for decompression by GI as there may be Ginko Biloba tablets impacting rectum?  Pertinent  Medical History   Past Medical History:  Diagnosis Date   Anxiety    Arthritis    with fracture right great toe 08/30/11 from "stepping wrong"   Asthma    Dysrhythmia 06/21/2013   NEW ONSET ATRIAL FIBRILATION/RVR   GERD (gastroesophageal reflux disease)    Hypotension    Kidney stone    Osteoporosis    Polyp of colon, adenomatous    recurrent   Urinary tract infection 08/29/11   Cipro per PCP- states is resolving     Significant Hospital Events: Including procedures, antibiotic start and stop dates in addition to other pertinent events   3/6 admit 3/9 ICU transfer  Interim History / Subjective:  Consult  Objective   Blood pressure (!) 147/119, pulse (!) 129, temperature 97.8 F (36.6 C), temperature source Oral, resp. rate (!) 30, height 4\' 11"  (1.499 m), weight 44 kg, SpO2 95%.        Intake/Output Summary (Last 24 hours) at 07/12/2023 0645 Last data filed at 07/05/2023 2000 Gross per 24 hour  Intake 672 ml  Output 100 ml  Net 572 ml   Filed Weights   07/04/23 1409  Weight: 44 kg    Examination: Elderly ill appearing moaning Rapid breathing Withdraws x 4 High pitched bowel sounds Marked abdominal distension +muscle wasting MM dry Not following commands  Imaging and labs reviewed  Resolved Hospital Problem list   N/A  Assessment & Plan:  Oglevie syndrome Aspiration pneumonia Worsening  mentation, query CO2 retention, hypernatremia, med effect, septic response Lactic acidosis query threatened intestinal ischemia Acute kidney injury Protein calorie malnutrition POA Hyperglycemia  Not protecting airway well; needs decompression and rest: intubate, vent bundle, opiates sparingly as able  Will discuss SubQ neostigmine, enema w/ GI vs going straight to scope for decompression  Monitor q6h lactate, serial exams, CCS consult if worsens  LR@ 100/hr, zosyn  Husband updated by phone: will be here in a couple hours  Update: shortly after arrival, Bps dropped; HR remains up, watch lactate, may need to move up CCS eval but not sure if a great surgical candidate; hopefully this is just septic response from aspiration  Best Practice (right click and "Reselect all SmartList Selections" daily)   Diet/type: NPO DVT prophylaxis LMWH Pressure ulcer(s): present on admission  GI prophylaxis: PPI Lines: Central line Foley:  Yes, and it is still needed Code Status:  full code Last date of multidisciplinary goals of care discussion [updated husband]  Labs   CBC: Recent Labs  Lab 07/03/23 1030 07/04/23 0955 07/05/23 0717 07/09/2023 0021 07/24/2023 0317  WBC 7.8 7.7 9.8 9.2 13.1*  NEUTROABS  --  5.2 7.5 7.2 10.9*  HGB 12.2 12.5 12.9 14.9 15.4*  HCT 36.6 37.6 38.5 44.7 46.1*  MCV 89.3 90.4 89.5 89.0 89.7  PLT 264 243 275 420* 416*    Basic Metabolic Panel: Recent Labs  Lab 07/03/23 1030 07/04/23 0955  07/05/23 0717 07/22/2023 0021 07/13/2023 0317  NA 139 142 147* 157* 161*  K 3.0* 3.5 3.8 3.7 3.8  CL 100 104 106 112* 117*  CO2 30 23 21* 19* 20*  GLUCOSE 101* 80 81 289* 244*  BUN 9 9 15  26* 28*  CREATININE 1.08* 1.10* 1.45* 1.94* 1.81*  CALCIUM 9.3 9.2 9.4 10.7* 11.2*  MG  --   --  2.1  --  3.2*  PHOS  --   --  4.4  --  1.9*   GFR: Estimated Creatinine Clearance: 17.2 mL/min (A) (by C-G formula based on SCr of 1.81 mg/dL (H)). Recent Labs  Lab 07/04/23 0955  07/05/23 0717 07/14/2023 0021 07/27/2023 0317  WBC 7.7 9.8 9.2 13.1*  LATICACIDVEN  --   --  4.1* 3.6*    Liver Function Tests: Recent Labs  Lab 07/03/23 1030 07/04/23 0955 07/05/23 0717 07/22/2023 0021 07/10/2023 0317  AST 22 19 19 27 25   ALT 18 16 16 22 20   ALKPHOS 55 57 54 64 62  BILITOT 0.7 1.3* 2.1* 1.8* 1.1  PROT 6.2* 6.1* 5.9* 7.0 6.6  ALBUMIN 3.6 3.5 3.3* 4.0 3.8   Recent Labs  Lab 07/03/23 1030  LIPASE 60*   No results for input(s): "AMMONIA" in the last 168 hours.  ABG No results found for: "PHART", "PCO2ART", "PO2ART", "HCO3", "TCO2", "ACIDBASEDEF", "O2SAT"   Coagulation Profile: No results for input(s): "INR", "PROTIME" in the last 168 hours.  Cardiac Enzymes: No results for input(s): "CKTOTAL", "CKMB", "CKMBINDEX", "TROPONINI" in the last 168 hours.  HbA1C: Hgb A1c MFr Bld  Date/Time Value Ref Range Status  07/04/2023 03:17 AM 5.4 4.8 - 5.6 % Final    Comment:    (NOTE) Pre diabetes:          5.7%-6.4%  Diabetes:              >6.4%  Glycemic control for   <7.0% adults with diabetes     CBG: Recent Labs  Lab 07/05/23 2330 07/12/2023 0452  GLUCAP 277* 249*    Review of Systems:   Too lethargic  Past Medical History:  She,  has a past medical history of Anxiety, Arthritis, Asthma, Dysrhythmia (06/21/2013), GERD (gastroesophageal reflux disease), Hypotension, Kidney stone, Osteoporosis, Polyp of colon, adenomatous, and Urinary tract infection (08/29/11).   Surgical History:   Past Surgical History:  Procedure Laterality Date   ABDOMINAL HYSTERECTOMY  1984   BACK SURGERY  1997-1998   fell on ice and snow   BREAST LUMPECTOMY WITH RADIOACTIVE SEED LOCALIZATION Left 08/06/2019   Procedure: LEFT BREAST LUMPECTOMY WITH RADIOACTIVE SEED LOCALIZATION;  Surgeon: Griselda Miner, MD;  Location: [facility];  Service: General;  Laterality: Left;   BUNIONECTOMY  2002   right foot   ESOPHAGOGASTRODUODENOSCOPY (EGD) WITH PROPOFOL N/A 03/10/2023    Procedure: ESOPHAGOGASTRODUODENOSCOPY (EGD) WITH PROPOFOL;  Surgeon: Jeani Hawking, MD;  Location: [facility];  Service: Gastroenterology;  Laterality: N/A;   EYE SURGERY  2000   cataract extraction with IOL   HAND SURGERY  1994   cyst removed   HEMICOLECTOMY  09/06/11   INCISIONAL HERNIA REPAIR N/A 01/31/2017   Procedure: LAPAROSCOPIC INCISIONAL HERNIA REPAIR WITH MESH;  Surgeon: Avel Peace, MD;  Location: [facility];  Service: General;  Laterality: N/A;   INSERTION OF MESH N/A 01/31/2017   Procedure: INSERTION OF MESH;  Surgeon: Avel Peace, MD;  Location: [facility];  Service: General;  Laterality: N/A;   KNEE SURGERY  1990   POLYPECTOMY  SHOULDER SURGERY  2007   right    TONSILLECTOMY  age of 46   UPPER ESOPHAGEAL ENDOSCOPIC ULTRASOUND (EUS)  03/10/2023   Procedure: UPPER ESOPHAGEAL ENDOSCOPIC ULTRASOUND (EUS);  Surgeon: Jeani Hawking, MD;  Location: [facility];  Service: Gastroenterology;;   uretheral dilitation       Social History:   reports that she quit smoking about 34 years ago. Her smoking use included cigarettes. She started smoking about 44 years ago. She has a 15 pack-year smoking history. She has never used smokeless tobacco. She reports that she does not drink alcohol and does not use drugs.   Family History:  Her family history includes Cancer in her cousin, maternal uncle, and mother.   Allergies Allergies  Allergen Reactions   Elemental Sulfur Itching   Linaclotide Other (See Comments)    Other reaction(s): Other (See Comments)  Diarrhea   Patient dose not recognize   Prednisone Other (See Comments)    Patient states that 20 mgs of prednisone feel loopy.   Dicyclomine Rash   Doxycycline Rash   Penicillins Rash     Home Medications  Prior to Admission medications   Medication Sig Start Date End Date Taking? Authorizing Provider  acetaminophen (TYLENOL) 500 MG tablet Take 500 mg by mouth every 6 (six) hours as needed for mild pain (pain score  1-3) or moderate pain (pain score 4-6).   Yes [provider]  albuterol (PROVENTIL) (2.5 MG/3ML) 0.083% nebulizer solution Take 2.5 mg by nebulization every 6 (six) hours as needed for wheezing or shortness of breath.    Yes [provider]  ALPRAZolam Prudy Feeler) 0.5 MG tablet Take 0.25 mg by mouth 2 (two) times daily as needed for anxiety. Anxiety 06/17/11  Yes [provider]  aspirin EC 81 MG tablet Take 1 tablet (81 mg total) by mouth daily. 04/18/15  Yes Lyn Records, MD  atorvastatin (LIPITOR) 10 MG tablet Take 1 tablet (10 mg total) by mouth at bedtime. 02/13/23  Yes Weaver, Scott T, PA-C  B Complex Vitamins (VITAMIN B-COMPLEX) TABS Take 1 tablet by mouth at bedtime.   Yes [provider]  budesonide-formoterol (SYMBICORT) 160-4.5 MCG/ACT inhaler Inhale 1 puff into the lungs 2 (two) times daily. 07/09/16  Yes [provider]  Calcium Carbonate-Vitamin D (CALCIUM-CARB 600 + D PO) Take 1 tablet by mouth at bedtime.   Yes [provider]  carboxymethylcellulose (REFRESH PLUS) 0.5 % SOLN Place 1 drop into both eyes 2 (two) times daily as needed. Dry eyes   Yes [provider]  cetirizine (ZYRTEC) 10 MG tablet Take 10 mg by mouth at bedtime.   Yes [provider]  famotidine (PEPCID) 20 MG tablet Take 20 mg by mouth daily. 06/25/23 08/24/23 Yes [provider]  fluticasone (FLONASE) 50 MCG/ACT nasal spray Place 2 sprays into both nostrils 2 (two) times daily as needed for allergies.  05/22/16  Yes [provider]  furosemide (LASIX) 40 MG tablet Take 1 tablet (40 mg total) by mouth daily. Patient taking differently: Take 20 mg by mouth daily. 02/13/23  Yes Weaver, Scott T, PA-C  hyoscyamine (LEVSIN) 0.125 MG tablet Take 0.125 mg by mouth every 6 (six) hours as needed for cramping. 06/25/23  Yes [provider]  LINZESS 145 MCG CAPS capsule Take 145 mcg by mouth every morning.   Yes [provider]   Lutein 6 MG CAPS Take 6 mg by mouth daily.   Yes [provider]  montelukast (SINGULAIR) 10  MG tablet Take 10 mg by mouth at bedtime.  07/17/11  Yes [provider]  ondansetron (ZOFRAN-ODT) 4 MG disintegrating tablet Take 4 mg by mouth every 8 (eight) hours as needed for nausea, vomiting or refractory nausea / vomiting. 06/13/23  Yes [provider]  potassium chloride SA (KLOR-CON M) 20 MEQ tablet Take 1 tablet (20 mEq total) by mouth daily. 02/12/23  Yes Weaver, Scott T, PA-C  vitamin E 400 UNIT capsule Take 400 Units by mouth at bedtime.   Yes [provider]     Critical care time: 34 mins independent of procedures

## 2023-07-29 NOTE — Progress Notes (Signed)
 Notified Dr. Katrinka Blazing that patient's blood pressure is not doing well. BP 86/37, Sinus tach 130s. Levophed almost maxed out. Work of breathing increased therefore propofol was started at 5 mcg/kg/min. MD acknowledged and stated he would place some orders and asked to have RT place arterial line. RN spoke with Neysa Bonito R, RRT and asked her to place arterial line per Dr. Michaelle Copas order. RT acknowledged.

## 2023-07-29 NOTE — Progress Notes (Addendum)
 eLink Physician-Brief Progress Note Patient Name: Katherine Floyd DOB: [birth year] MRN: [id number]   Date of Service  07/25/2023  HPI/Events of Note  78 year old woman w/ hx of PAF not on AC, chronic constipation, prior SBO who is presenting with abdominal pain.  Transition to comfort care once family arrived.  eICU Interventions  Time of death 2129/07/16     Intervention Category Minor Interventions: Routine modifications to care plan (e.g. PRN medications for pain, fever)  Cadi Rhinehart 07/27/2023, 9:52 PM

## 2023-07-29 NOTE — Plan of Care (Signed)
  Interdisciplinary Goals of Care Family Meeting   Date carried out:: 07/04/2023  Location of the meeting: Bedside  Member's involved: Bedside Registered Nurse, Family Member or next of kin, and Other: PA  Durable Power of Insurance risk surveyor:  P t's husband    Discussion: We discussed goals of care for [employer] .  We  Code status: {IDGC Code Status:21369::"Full Code"}  Disposition: {IDGC Disposition:21372}   Time spent for the meeting: ***  Katherine Floyd Katherine Floyd 07/09/2023, 7:36 PM

## 2023-07-29 NOTE — Progress Notes (Signed)
 Pt orally intubated using glidescope on 1st attempt by dr Katrinka Blazing, ccm w/ 7.5 ett @ 22cm lips. + bbs; +etco2. Cxr and abg pending. Pt placed on vent at documented settings.

## 2023-07-29 NOTE — Progress Notes (Addendum)
 Rapid response called as changes had altered mentation which was new and Worsening abdominal discomfort and distention.  Patient had episode of emesis, became hypoxic.  Was placed on 3 L oxygen.  NG tube thought not to be in the correct place, this was reinserted. CT abdomen and pelvis without contrast, BMP, CBC, lactic acid ordered  LA 4.1, Na 157, glucose 287 [patient without history of diabetes.  Creatinine 1.94 up from 1.45 LR bolus 2 L ordered.  Follow-up lactic acid D5 @100  cc/hr.  Sliding scale insulin ordered every 4 hours  Patient is abdomen very very distended and taut Patient very uncomfortable, writhing with discomfort.  1 mg IV Dilaudid given  CT abdomen and pelvis shows Interval worsening multilobar pneumonia with lower lobe bronchial thickening and scattered subsegmental bronchial plugging. Aspiration component is likely. Recommend aspiration precautions unless already being done. 2. Interval NGT insertion. 3. Increased fluid in the colon where formed stool was previously, with diffuse decrease in formed stool, diffuse dilatation of small and large bowel slightly worsening. Findings are consistent with adynamic ileus or bowel obstruction at the level of the anorectal junction.   Patient more somnolent post Dilaudid Patient had been declining rectal tube.  Nursing to insert rectal tube. Blood cultures x 2 ordered.  IV Zosyn started.  D5 increased to 150 cc/hr Repeat sodium 161, creatinine improved to 1.8, phosphorus 1.9, WBCs 13.1 ABG ordered.  Patient transferred to stepdown Vitals 147/119, 129, 30, afebrile.  3 L oxygen, 93% Patient clinically worsening, at risk of further decline. Critical care Dr. Sherryll Burger consulted to see patient

## 2023-07-29 NOTE — Progress Notes (Signed)
 Pt arrived to room 4nicu at this time.  Pt stooling large amt watery stool on arrival.  BP 61/47.  Pt lethargic with very large, distended abdomen.  Levo ordered and started emergently.  CCM at bedside.

## 2023-07-29 NOTE — Progress Notes (Signed)
 Extubation orders placed by provider after pt transitioned to comfort care. Patient extubated to RA.

## 2023-07-29 DEATH — deceased
